# Patient Record
Sex: Female | Born: 1940 | Race: White | Hispanic: No | Smoking: Former smoker
Health system: Southern US, Community
[De-identification: ages and names within clinical notes are randomized; demographics above are authoritative.]

## PROBLEM LIST (undated history)

## (undated) DIAGNOSIS — M1990 Unspecified osteoarthritis, unspecified site: Secondary | ICD-10-CM

## (undated) DIAGNOSIS — C801 Malignant (primary) neoplasm, unspecified: Secondary | ICD-10-CM

## (undated) DIAGNOSIS — I351 Nonrheumatic aortic (valve) insufficiency: Secondary | ICD-10-CM

## (undated) DIAGNOSIS — K219 Gastro-esophageal reflux disease without esophagitis: Secondary | ICD-10-CM

## (undated) DIAGNOSIS — F419 Anxiety disorder, unspecified: Secondary | ICD-10-CM

## (undated) DIAGNOSIS — Z860101 Personal history of adenomatous and serrated colon polyps: Secondary | ICD-10-CM

## (undated) DIAGNOSIS — Z8489 Family history of other specified conditions: Secondary | ICD-10-CM

## (undated) DIAGNOSIS — J302 Other seasonal allergic rhinitis: Secondary | ICD-10-CM

## (undated) DIAGNOSIS — E079 Disorder of thyroid, unspecified: Secondary | ICD-10-CM

## (undated) DIAGNOSIS — F32A Depression, unspecified: Secondary | ICD-10-CM

## (undated) DIAGNOSIS — Z9289 Personal history of other medical treatment: Secondary | ICD-10-CM

## (undated) DIAGNOSIS — E785 Hyperlipidemia, unspecified: Secondary | ICD-10-CM

## (undated) DIAGNOSIS — Z780 Asymptomatic menopausal state: Secondary | ICD-10-CM

## (undated) DIAGNOSIS — I10 Essential (primary) hypertension: Secondary | ICD-10-CM

## (undated) DIAGNOSIS — J189 Pneumonia, unspecified organism: Secondary | ICD-10-CM

## (undated) DIAGNOSIS — E039 Hypothyroidism, unspecified: Secondary | ICD-10-CM

## (undated) DIAGNOSIS — K5790 Diverticulosis of intestine, part unspecified, without perforation or abscess without bleeding: Secondary | ICD-10-CM

## (undated) DIAGNOSIS — S52502A Unspecified fracture of the lower end of left radius, initial encounter for closed fracture: Secondary | ICD-10-CM

## (undated) DIAGNOSIS — M8580 Other specified disorders of bone density and structure, unspecified site: Secondary | ICD-10-CM

## (undated) DIAGNOSIS — F329 Major depressive disorder, single episode, unspecified: Secondary | ICD-10-CM

## (undated) DIAGNOSIS — K5792 Diverticulitis of intestine, part unspecified, without perforation or abscess without bleeding: Secondary | ICD-10-CM

## (undated) DIAGNOSIS — L900 Lichen sclerosus et atrophicus: Secondary | ICD-10-CM

## (undated) DIAGNOSIS — Z86010 Personal history of colonic polyps: Secondary | ICD-10-CM

## (undated) HISTORY — DX: Other seasonal allergic rhinitis: J30.2

## (undated) HISTORY — DX: Diverticulitis of intestine, part unspecified, without perforation or abscess without bleeding: K57.92

## (undated) HISTORY — PX: CARDIAC CATHETERIZATION: SHX172

## (undated) HISTORY — DX: Asymptomatic menopausal state: Z78.0

## (undated) HISTORY — PX: HYSTEROSCOPY: SHX211

## (undated) HISTORY — DX: Disorder of thyroid, unspecified: E07.9

## (undated) HISTORY — DX: Gastro-esophageal reflux disease without esophagitis: K21.9

## (undated) HISTORY — DX: Nonrheumatic aortic (valve) insufficiency: I35.1

## (undated) HISTORY — PX: BACK SURGERY: SHX140

## (undated) HISTORY — PX: CATARACT EXTRACTION W/ INTRAOCULAR LENS  IMPLANT, BILATERAL: SHX1307

## (undated) HISTORY — PX: DILATION AND CURETTAGE OF UTERUS: SHX78

## (undated) HISTORY — DX: Diverticulosis of intestine, part unspecified, without perforation or abscess without bleeding: K57.90

## (undated) HISTORY — DX: Other specified disorders of bone density and structure, unspecified site: M85.80

## (undated) HISTORY — DX: Hyperlipidemia, unspecified: E78.5

## (undated) HISTORY — DX: Personal history of adenomatous and serrated colon polyps: Z86.0101

## (undated) HISTORY — PX: JOINT REPLACEMENT: SHX530

## (undated) HISTORY — PX: TONSILLECTOMY: SUR1361

## (undated) HISTORY — DX: Lichen sclerosus et atrophicus: L90.0

## (undated) HISTORY — PX: BLEPHAROPLASTY: SUR158

## (undated) HISTORY — PX: CARPAL TUNNEL RELEASE: SHX101

## (undated) HISTORY — DX: Personal history of colonic polyps: Z86.010

---

## 1975-08-30 DIAGNOSIS — Z9289 Personal history of other medical treatment: Secondary | ICD-10-CM

## 1975-08-30 HISTORY — DX: Personal history of other medical treatment: Z92.89

## 1975-08-30 HISTORY — PX: APPENDECTOMY: SHX54

## 1976-08-29 HISTORY — PX: RIGHT OOPHORECTOMY: SHX2359

## 1992-08-29 HISTORY — PX: PATELLA RECONSTRUCTION: SHX736

## 1997-08-29 HISTORY — PX: CERVICAL FUSION: SHX112

## 1998-02-27 ENCOUNTER — Ambulatory Visit (HOSPITAL_COMMUNITY): Admission: RE | Admit: 1998-02-27 | Discharge: 1998-02-27 | Payer: Self-pay | Admitting: Surgery

## 1998-07-30 ENCOUNTER — Other Ambulatory Visit: Admission: RE | Admit: 1998-07-30 | Discharge: 1998-07-30 | Payer: Self-pay | Admitting: Obstetrics and Gynecology

## 1999-07-30 ENCOUNTER — Other Ambulatory Visit: Admission: RE | Admit: 1999-07-30 | Discharge: 1999-07-30 | Payer: Self-pay | Admitting: Obstetrics and Gynecology

## 1999-08-25 ENCOUNTER — Encounter: Admission: RE | Admit: 1999-08-25 | Discharge: 1999-08-25 | Payer: Self-pay | Admitting: Obstetrics and Gynecology

## 1999-08-25 ENCOUNTER — Encounter: Payer: Self-pay | Admitting: Obstetrics and Gynecology

## 2000-08-14 ENCOUNTER — Other Ambulatory Visit: Admission: RE | Admit: 2000-08-14 | Discharge: 2000-08-14 | Payer: Self-pay | Admitting: Obstetrics and Gynecology

## 2000-12-25 ENCOUNTER — Other Ambulatory Visit: Admission: RE | Admit: 2000-12-25 | Discharge: 2000-12-25 | Payer: Self-pay | Admitting: Obstetrics and Gynecology

## 2000-12-25 ENCOUNTER — Encounter (INDEPENDENT_AMBULATORY_CARE_PROVIDER_SITE_OTHER): Payer: Self-pay | Admitting: Specialist

## 2001-03-20 ENCOUNTER — Encounter: Payer: Self-pay | Admitting: Internal Medicine

## 2001-03-20 ENCOUNTER — Encounter (INDEPENDENT_AMBULATORY_CARE_PROVIDER_SITE_OTHER): Payer: Self-pay | Admitting: *Deleted

## 2001-03-20 ENCOUNTER — Ambulatory Visit (HOSPITAL_COMMUNITY): Admission: RE | Admit: 2001-03-20 | Discharge: 2001-03-20 | Payer: Self-pay | Admitting: Gastroenterology

## 2001-11-14 ENCOUNTER — Encounter: Payer: Self-pay | Admitting: Obstetrics and Gynecology

## 2001-11-14 ENCOUNTER — Encounter: Admission: RE | Admit: 2001-11-14 | Discharge: 2001-11-14 | Payer: Self-pay | Admitting: Obstetrics and Gynecology

## 2002-01-11 ENCOUNTER — Other Ambulatory Visit: Admission: RE | Admit: 2002-01-11 | Discharge: 2002-01-11 | Payer: Self-pay | Admitting: Obstetrics and Gynecology

## 2002-03-08 ENCOUNTER — Encounter: Payer: Self-pay | Admitting: Gastroenterology

## 2002-03-08 ENCOUNTER — Ambulatory Visit (HOSPITAL_COMMUNITY): Admission: RE | Admit: 2002-03-08 | Discharge: 2002-03-08 | Payer: Self-pay | Admitting: Gastroenterology

## 2002-07-08 ENCOUNTER — Encounter: Payer: Self-pay | Admitting: Internal Medicine

## 2002-08-14 ENCOUNTER — Ambulatory Visit (HOSPITAL_COMMUNITY): Admission: RE | Admit: 2002-08-14 | Discharge: 2002-08-14 | Payer: Self-pay | Admitting: *Deleted

## 2002-08-14 ENCOUNTER — Encounter (INDEPENDENT_AMBULATORY_CARE_PROVIDER_SITE_OTHER): Payer: Self-pay | Admitting: *Deleted

## 2002-08-29 HISTORY — PX: LUMBAR DISC SURGERY: SHX700

## 2002-11-06 ENCOUNTER — Encounter: Admission: RE | Admit: 2002-11-06 | Discharge: 2002-11-06 | Payer: Self-pay | Admitting: Family Medicine

## 2002-11-06 ENCOUNTER — Encounter: Payer: Self-pay | Admitting: Family Medicine

## 2002-11-12 ENCOUNTER — Ambulatory Visit (HOSPITAL_COMMUNITY): Admission: RE | Admit: 2002-11-12 | Discharge: 2002-11-13 | Payer: Self-pay | Admitting: Neurosurgery

## 2002-11-12 ENCOUNTER — Encounter: Payer: Self-pay | Admitting: Neurosurgery

## 2003-04-23 ENCOUNTER — Other Ambulatory Visit: Admission: RE | Admit: 2003-04-23 | Discharge: 2003-04-23 | Payer: Self-pay | Admitting: Obstetrics and Gynecology

## 2004-07-01 ENCOUNTER — Other Ambulatory Visit: Admission: RE | Admit: 2004-07-01 | Discharge: 2004-07-01 | Payer: Self-pay | Admitting: Obstetrics and Gynecology

## 2004-12-31 ENCOUNTER — Encounter: Admission: RE | Admit: 2004-12-31 | Discharge: 2004-12-31 | Payer: Self-pay | Admitting: Emergency Medicine

## 2005-05-11 ENCOUNTER — Ambulatory Visit: Payer: Self-pay | Admitting: Cardiology

## 2005-05-27 ENCOUNTER — Ambulatory Visit: Payer: Self-pay | Admitting: Cardiology

## 2005-07-06 ENCOUNTER — Other Ambulatory Visit: Admission: RE | Admit: 2005-07-06 | Discharge: 2005-07-06 | Payer: Self-pay | Admitting: Obstetrics and Gynecology

## 2005-08-11 ENCOUNTER — Encounter: Payer: Self-pay | Admitting: Internal Medicine

## 2005-09-26 ENCOUNTER — Encounter: Payer: Self-pay | Admitting: Internal Medicine

## 2005-10-13 ENCOUNTER — Encounter: Payer: Self-pay | Admitting: Internal Medicine

## 2005-10-17 ENCOUNTER — Encounter: Payer: Self-pay | Admitting: Internal Medicine

## 2006-05-26 ENCOUNTER — Encounter: Payer: Self-pay | Admitting: Internal Medicine

## 2006-08-04 ENCOUNTER — Other Ambulatory Visit: Admission: RE | Admit: 2006-08-04 | Discharge: 2006-08-04 | Payer: Self-pay | Admitting: Obstetrics and Gynecology

## 2006-09-11 ENCOUNTER — Encounter: Payer: Self-pay | Admitting: Internal Medicine

## 2006-10-31 ENCOUNTER — Encounter: Payer: Self-pay | Admitting: Internal Medicine

## 2006-12-15 ENCOUNTER — Ambulatory Visit: Payer: Self-pay | Admitting: Cardiology

## 2006-12-15 LAB — CONVERTED CEMR LAB
ALT: 65 units/L — ABNORMAL HIGH (ref 0–40)
AST: 50 units/L — ABNORMAL HIGH (ref 0–37)
BUN: 14 mg/dL (ref 6–23)
Bilirubin, Direct: 0.1 mg/dL (ref 0.0–0.3)
CO2: 29 meq/L (ref 19–32)
Cholesterol: 250 mg/dL (ref 0–200)
Direct LDL: 181.9 mg/dL
GFR calc non Af Amer: 106 mL/min
Sodium: 146 meq/L — ABNORMAL HIGH (ref 135–145)
Total Bilirubin: 0.9 mg/dL (ref 0.3–1.2)
Total Protein: 6.4 g/dL (ref 6.0–8.3)
Triglycerides: 177 mg/dL — ABNORMAL HIGH (ref 0–149)
VLDL: 35 mg/dL (ref 0–40)

## 2006-12-19 ENCOUNTER — Ambulatory Visit: Payer: Self-pay | Admitting: Cardiology

## 2007-01-29 ENCOUNTER — Ambulatory Visit: Payer: Self-pay | Admitting: Cardiology

## 2007-01-29 LAB — CONVERTED CEMR LAB
AST: 34 units/L (ref 0–37)
Alkaline Phosphatase: 59 units/L (ref 39–117)
LDL Cholesterol: 123 mg/dL — ABNORMAL HIGH (ref 0–99)
TSH: 2.91 microintl units/mL (ref 0.35–5.50)
Total CHOL/HDL Ratio: 5.1

## 2007-02-09 ENCOUNTER — Ambulatory Visit: Payer: Self-pay | Admitting: Cardiology

## 2007-04-19 ENCOUNTER — Ambulatory Visit: Payer: Self-pay | Admitting: Internal Medicine

## 2007-08-01 ENCOUNTER — Ambulatory Visit: Payer: Self-pay | Admitting: Cardiology

## 2007-08-01 LAB — CONVERTED CEMR LAB
ALT: 29 units/L (ref 0–35)
AST: 28 units/L (ref 0–37)
Albumin: 4 g/dL (ref 3.5–5.2)
Alkaline Phosphatase: 66 units/L (ref 39–117)
Bilirubin, Direct: 0.1 mg/dL (ref 0.0–0.3)
HDL: 36.9 mg/dL — ABNORMAL LOW (ref >39.0)
Total Bilirubin: 0.9 mg/dL (ref 0.3–1.2)
Total CHOL/HDL Ratio: 7.5
Total Protein: 7 g/dL (ref 6.0–8.3)
VLDL: 42 mg/dL — ABNORMAL HIGH (ref 0–40)

## 2007-08-13 ENCOUNTER — Other Ambulatory Visit: Admission: RE | Admit: 2007-08-13 | Discharge: 2007-08-13 | Payer: Self-pay | Admitting: Obstetrics and Gynecology

## 2008-02-18 ENCOUNTER — Telehealth: Payer: Self-pay | Admitting: Internal Medicine

## 2008-02-20 ENCOUNTER — Telehealth (INDEPENDENT_AMBULATORY_CARE_PROVIDER_SITE_OTHER): Payer: Self-pay | Admitting: *Deleted

## 2008-02-28 ENCOUNTER — Telehealth (INDEPENDENT_AMBULATORY_CARE_PROVIDER_SITE_OTHER): Payer: Self-pay | Admitting: *Deleted

## 2008-08-13 ENCOUNTER — Encounter: Payer: Self-pay | Admitting: Obstetrics and Gynecology

## 2008-08-13 ENCOUNTER — Ambulatory Visit: Payer: Self-pay | Admitting: Obstetrics and Gynecology

## 2008-08-13 ENCOUNTER — Other Ambulatory Visit: Admission: RE | Admit: 2008-08-13 | Discharge: 2008-08-13 | Payer: Self-pay | Admitting: Obstetrics and Gynecology

## 2008-11-17 ENCOUNTER — Ambulatory Visit: Payer: Self-pay | Admitting: Obstetrics and Gynecology

## 2009-01-15 ENCOUNTER — Encounter: Admission: RE | Admit: 2009-01-15 | Discharge: 2009-01-15 | Payer: Self-pay | Admitting: Family Medicine

## 2009-04-10 DIAGNOSIS — E782 Mixed hyperlipidemia: Secondary | ICD-10-CM | POA: Insufficient documentation

## 2009-04-15 ENCOUNTER — Ambulatory Visit: Payer: Self-pay | Admitting: Cardiology

## 2009-08-17 ENCOUNTER — Other Ambulatory Visit: Admission: RE | Admit: 2009-08-17 | Discharge: 2009-08-17 | Payer: Self-pay | Admitting: Obstetrics and Gynecology

## 2009-08-17 ENCOUNTER — Ambulatory Visit: Payer: Self-pay | Admitting: Obstetrics and Gynecology

## 2009-09-07 ENCOUNTER — Telehealth: Payer: Self-pay | Admitting: Internal Medicine

## 2009-10-09 ENCOUNTER — Ambulatory Visit: Payer: Self-pay | Admitting: Internal Medicine

## 2009-10-09 DIAGNOSIS — K5900 Constipation, unspecified: Secondary | ICD-10-CM | POA: Insufficient documentation

## 2009-10-09 DIAGNOSIS — Z860100 Personal history of colon polyps, unspecified: Secondary | ICD-10-CM | POA: Insufficient documentation

## 2009-10-27 ENCOUNTER — Ambulatory Visit: Payer: Self-pay | Admitting: Obstetrics and Gynecology

## 2009-12-07 ENCOUNTER — Ambulatory Visit: Payer: Self-pay | Admitting: Obstetrics and Gynecology

## 2010-06-27 ENCOUNTER — Encounter: Admission: RE | Admit: 2010-06-27 | Discharge: 2010-06-27 | Payer: Self-pay | Admitting: Orthopedic Surgery

## 2010-07-08 ENCOUNTER — Encounter: Payer: Self-pay | Admitting: Internal Medicine

## 2010-07-13 ENCOUNTER — Encounter (INDEPENDENT_AMBULATORY_CARE_PROVIDER_SITE_OTHER): Payer: Self-pay | Admitting: *Deleted

## 2010-08-12 ENCOUNTER — Encounter (INDEPENDENT_AMBULATORY_CARE_PROVIDER_SITE_OTHER): Payer: Self-pay | Admitting: *Deleted

## 2010-08-12 ENCOUNTER — Ambulatory Visit: Payer: Self-pay | Admitting: Internal Medicine

## 2010-08-30 ENCOUNTER — Ambulatory Visit: Payer: Self-pay | Admitting: Obstetrics and Gynecology

## 2010-08-31 ENCOUNTER — Telehealth (INDEPENDENT_AMBULATORY_CARE_PROVIDER_SITE_OTHER): Payer: Self-pay | Admitting: *Deleted

## 2010-09-01 ENCOUNTER — Telehealth: Payer: Self-pay | Admitting: Internal Medicine

## 2010-09-02 ENCOUNTER — Ambulatory Visit: Admit: 2010-09-02 | Payer: Self-pay | Admitting: Internal Medicine

## 2010-09-08 ENCOUNTER — Encounter: Payer: Self-pay | Admitting: Internal Medicine

## 2010-09-08 ENCOUNTER — Ambulatory Visit
Admission: RE | Admit: 2010-09-08 | Discharge: 2010-09-08 | Payer: Self-pay | Source: Home / Self Care | Attending: Internal Medicine | Admitting: Internal Medicine

## 2010-09-08 DIAGNOSIS — K219 Gastro-esophageal reflux disease without esophagitis: Secondary | ICD-10-CM | POA: Insufficient documentation

## 2010-09-08 DIAGNOSIS — K5732 Diverticulitis of large intestine without perforation or abscess without bleeding: Secondary | ICD-10-CM | POA: Insufficient documentation

## 2010-09-18 ENCOUNTER — Encounter: Payer: Self-pay | Admitting: Family Medicine

## 2010-09-30 NOTE — Progress Notes (Signed)
Summary: Diverticulitis flare - follow up   Phone Note Outgoing Call   Summary of Call: Pt. contacted about tx. for diverticulitis on Jan.2./12 by Dr.Brodie via phone.Dr.Brodie says she treated pt. with 10 days of Cipro and Flagyl and gave her Vicodin for pain.Pt. had left message on paging co. machine to cx. the procedure for Thursday but procedure not cx. Pt. told I will cx. procedure for Thursday and she needs to schedule f/u appt. with Dr.Perry and she will do so. Initial call taken by: Teryl Lucy RN,  August 31, 2010 2:56 PM  Follow-up for Phone Call        agree Follow-up by: Hilarie Fredrickson MD,  August 31, 2010 3:12 PM

## 2010-09-30 NOTE — Progress Notes (Signed)
Summary: Appt sooner than next avail   Phone Note Call from Patient Call back at cell [phone]   Call For: Dr Marina Goodell Summary of Call: Was told to schedule a follow up appointment but refused next available on 09-27-2010. wans to be seen sooner for diverticulitis flare up. Initial call taken by: Leanor Kail [facility],  September 01, 2010 1:32 PM  Follow-up for Phone Call        Given r.o.v. appt. for 09/08/10. Follow-up by: Teryl Lucy RN,  September 01, 2010 1:52 PM

## 2010-09-30 NOTE — Letter (Signed)
Summary: [facility]   Imported By: Sherian Rein 10/12/2009 12:02:51  _____________________________________________________________________  External Attachment:    Type:   Image     Comment:   External Document

## 2010-09-30 NOTE — Miscellaneous (Signed)
Summary: [facility]  Clinical Lists Changes  Medications: Added new medication of MOVIPREP 100 GM  SOLR (PEG-KCL-NACL-NASULF-NA ASC-C) As per prep instructions. - Signed Rx of MOVIPREP 100 GM  SOLR (PEG-KCL-NACL-NASULF-NA ASC-C) As per prep instructions.;  #1 x 0;  Signed;  Entered by: Ezra Sites RN;  Authorized by: Hilarie Fredrickson MD;  Method used: Electronically to [facility] # [phone]*, [address], [place], [place]  [address], Ph: [phone], Fax: [phone] Observations: Added new observation of ALLERGY REV: Done (08/13/2010 15:24)    Prescriptions: MOVIPREP 100 GM  SOLR (PEG-KCL-NACL-NASULF-NA ASC-C) As per prep instructions.  #1 x 0   Entered by:   Ezra Sites RN   Authorized by:   Hilarie Fredrickson MD   Signed by:   Ezra Sites RN on 08/13/2010   Method used:   Electronically to        [facility] # [phone]* (retail)       [address]       [place], [place]  [address]       Ph: [phone]       Fax: [phone]   RxID:   [id number]

## 2010-09-30 NOTE — Letter (Signed)
Summary: Colonoscopy Letter  [facility]  [address] [place], [place] [address]   Phone: [phone]  Fax: [phone]      July 08, 2010 MRN: [id number]   [facility] [address] [place], [place]  [address]   Dear Ms. Rocco,   According to your medical record, it is time for you to schedule a Colonoscopy. The American Cancer Society recommends this procedure as a method to detect early colon cancer. Patients with a family history of colon cancer, or a personal history of colon polyps or inflammatory bowel disease are at increased risk.  This letter has been generated based on the recommendations made at the time of your procedure. If you feel that in your particular situation this may no longer apply, please contact our office.  Please call our office at [phone] to schedule this appointment or to update your records at your earliest convenience.  Thank you for cooperating with [place] to provide you with the very best care possible.   Sincerely,  Wilhemina Bonito. Marina Goodell, M.D.  [facility] [phone]

## 2010-09-30 NOTE — Procedures (Signed)
[facility]   Imported By: Sherian Rein 10/12/2009 12:07:39  _____________________________________________________________________  External Attachment:    Type:   Image     Comment:   External Document

## 2010-09-30 NOTE — Letter (Signed)
Summary: [facility]   Imported By: Sherian Rein 10/12/2009 12:00:22  _____________________________________________________________________  External Attachment:    Type:   Image     Comment:   External Document

## 2010-09-30 NOTE — Letter (Signed)
Summary: [facility]   Imported By: Sherian Rein 10/12/2009 12:04:36  _____________________________________________________________________  External Attachment:    Type:   Image     Comment:   External Document

## 2010-09-30 NOTE — Letter (Signed)
Summary: [facility]   Imported By: Sherian Rein 10/12/2009 12:01:28  _____________________________________________________________________  External Attachment:    Type:   Image     Comment:   External Document

## 2010-09-30 NOTE — Letter (Signed)
Summary: Moviprep Instructions  [facility]  [address]   [place], [place] [address]   Phone: [phone]  Fax: [phone]       KHALIA GONG    February 15, 1941    MRN: [id number]        Procedure Day Dorna Bloom: Thursday, 09-02-10     Arrival Time: 7:30 a.m.     Procedure Time: 8:00 a.m.     Location of Procedure:                     x  [facility] (4th Floor)   PREPARATION FOR COLONOSCOPY WITH MOVIPREP   Starting 5 days prior to your procedure 08-28-10 do not eat nuts, seeds, popcorn, corn, beans, peas,  salads, or any raw vegetables.  Do not take any fiber supplements (e.g. Metamucil, Citrucel, and Benefiber).  THE DAY BEFORE YOUR PROCEDURE         DATE: 09-01-10   DAY: Wednesday  1.  Drink clear liquids the entire day-NO SOLID FOOD  2.  Do not drink anything colored red or purple.  Avoid juices with pulp.  No orange juice.  3.  Drink at least 64 oz. (8 glasses) of fluid/clear liquids during the day to prevent dehydration and help the prep work efficiently.  CLEAR LIQUIDS INCLUDE: Water Jello Ice Popsicles Tea (sugar ok, no milk/cream) Powdered fruit flavored drinks Coffee (sugar ok, no milk/cream) Gatorade Juice: apple, white grape, white cranberry  Lemonade Clear bullion, consomm, broth Carbonated beverages (any kind) Strained chicken noodle soup Hard Candy                             4.  In the morning, mix first dose of MoviPrep solution:    Empty 1 Pouch A and 1 Pouch B into the disposable container    Add lukewarm drinking water to the top line of the container. Mix to dissolve    Refrigerate (mixed solution should be used within 24 hrs)  5.  Begin drinking the prep at 5:00 p.m. The MoviPrep container is divided by 4 marks.   Every 15 minutes drink the solution down to the next mark (approximately 8 oz) until the full liter is complete.   6.  Follow completed prep with 16 oz of clear liquid of your choice (Nothing red or  purple).  Continue to drink clear liquids until bedtime.  7.  Before going to bed, mix second dose of MoviPrep solution:    Empty 1 Pouch A and 1 Pouch B into the disposable container    Add lukewarm drinking water to the top line of the container. Mix to dissolve    Refrigerate  THE DAY OF YOUR PROCEDURE      DATE: 09-02-10  DAY: Thursday  Beginning at 3:00 a.m. (5 hours before procedure):         1. Every 15 minutes, drink the solution down to the next mark (approx 8 oz) until the full liter is complete.  2. Follow completed prep with 16 oz. of clear liquid of your choice.    3. You may drink clear liquids until 6:00 a.m.  (2 HOURS BEFORE PROCEDURE).   MEDICATION INSTRUCTIONS  Unless otherwise instructed, you should take regular prescription medications with a small sip of water   as early as possible the morning of your procedure.         OTHER INSTRUCTIONS  You will need a responsible  adult at least 71 years of age to accompany you and drive you home.   This person must remain in the waiting room during your procedure.  Wear loose fitting clothing that is easily removed.  Leave jewelry and other valuables at home.  However, you may wish to bring a book to read or  an iPod/MP3 player to listen to music as you wait for your procedure to start.  Remove all body piercing jewelry and leave at home.  Total time from sign-in until discharge is approximately 2-3 hours.  You should go home directly after your procedure and rest.  You can resume normal activities the  day after your procedure.  The day of your procedure you should not:   Drive   Make legal decisions   Operate machinery   Drink alcohol   Return to work  You will receive specific instructions about eating, activities and medications before you leave.    The above instructions have been reviewed and explained to me by  Ezra Sites RN  August 13, 2010 4:02 PM   I fully understand and can  verbalize these instructions _____________________________ Date _________

## 2010-09-30 NOTE — Assessment & Plan Note (Signed)
Summary: TO DISCUSS COLONOSCOPY   / constipation    History of Present Illness Visit Type: Follow-up Visit Primary GI MD: Yancey Flemings MD Primary Provider: Lajean Manes Chief Complaint: Discuss Colonoscopy/ Dr. Virginia Rochester patient History of Present Illness:   70 year old white female with a history of hypothyroidism, and dyslipidemia, chronic GERD, recurrent diverticulitis, and adenomatous colon polyps. She presents today regarding a personal history of colon polyps in the need for surveillance colonoscopy. Also, questions regarding constipation. Patient was last evaluated in the office as a new patient in August of 2008. Today dictation. She has seen multiple prior gastroenterologists. She underwent colonoscopy in 2002 and was found to have a diminutive adenoma. Repeat colonoscopy in December 2006 with a small polyp found in the an adenoma. At the time of her last visit we recommended surveillance colonoscopy in 5 years (or December 2011). This was based on current guidelines. She contacted the office recently questioning the followup interval. She was under the impression that most offices recommend colonoscopy every 2 years. She also tells me that she has been affected by a friend who was recently diagnosed with colon cancer. The patient denies any relevant GI symptoms. She denies abdominal pain, change in bowel habits, rectal bleeding, weight loss, or anemia. She does have chronic intermittent constipation which is unchanged for many years. She requests a recommendation for treatment.   GI Review of Systems      Denies abdominal pain, acid reflux, belching, bloating, chest pain, dysphagia with liquids, dysphagia with solids, heartburn, loss of appetite, nausea, vomiting, vomiting blood, weight loss, and  weight gain.      Reports constipation and  diverticulosis.     Denies anal fissure, black tarry stools, change in bowel habit, diarrhea, fecal incontinence, heme positive stool, hemorrhoids, irritable  bowel syndrome, jaundice, light color stool, liver problems, rectal bleeding, and  rectal pain.    Current Medications (verified): 1)  Synthroid 100 Mcg Tabs (Levothyroxine Sodium) .Marland Kitchen.. 1 Tab Once Daily 2)  Allegra 180 Mg Tabs (Fexofenadine Hcl) .... As Needed 3)  Calcium 600 600 Mg Tabs (Calcium Carbonate) .... 3  Tab Two Times A Day 4)  Vitamin D 5000 Iu .Marland Kitchen.. 1 Cap By Mouth Two Times A Day 5)  Niaspan 500 Mg Cr-Tabs (Niacin (Antihyperlipidemic)) .... Take 2 Tablets By Mouth Once Daily 6)  Levosa .... Take 4 Capsules Daily  Allergies: 1)  ! Pcn 2)  ! * Mycins 3)  ! Asa 4)  ! Lipitor (Atorvastatin)  Past History:  Past Medical History: Reviewed history from 04/10/2009 and no changes required. HYPERLIPIDEMIA-MIXED (ICD-272.4) GERD Diverticulitis Hypothyroidism Hemorrhoids Adenomatous Colon Polyps  Past Surgical History: Appendectomy back surgery/cervical disc fusion Knee Arthroscopy  Family History: Reviewed history from 04/10/2009 and no changes required. no family history of gastrointestinal malignancy.  Social History: Divorced  Alcohol Use - yes occasional Tobacco Use - No.  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  Vital Signs:  Patient profile:   70 year old female Height:      61.5 inches Weight:      167.38 pounds BMI:     31.23 Pulse rate:   76 / minute Pulse rhythm:   regular BP sitting:   120 / 76  (left arm) Cuff size:   regular  Vitals Entered By: June McMurray CMA Duncan Dull) (October 09, 2009 1:07 PM)  Physical Exam  General:  Well  developed, well nourished, no acute distress. Head:  Normocephalic and atraumatic. Eyes:  PERRLA, no icterus. Nose:  No deformity, discharge,  or lesions. Mouth:  No deformity or lesions, Neck:  Supple; no masses or thyromegaly. Lungs:  Clear throughout to auscultation. Heart:  Regular rate and rhythm; no murmurs, rubs,  or  bruits. Abdomen:  Soft, nontender and nondistended. No masses, hepatosplenomegaly or hernias noted. Normal bowel sounds. Msk:  Symmetrical with no gross deformities. Normal posture. Pulses:  Normal pulses noted. Extremities:  No clubbing, cyanosis, edema or deformities noted. Neurologic:  Alert and  oriented x4;  Skin:  Intact without significant lesions or rashes. Psych:  Alert and cooperative. Normal mood and affect.   Impression & Recommendations:  Problem # 1:  PERSONAL HX COLONIC POLYPS (ICD-V12.72) personal history of adenomatous colon polyps. Due for surveillance December 2011. Currently asymptomatic. I reviewed with her in detail the current guidelines for polyp surveillance. She is asymptomatic without any clinical indication to change the surveillance interval. Over 50% of today's encounter was spent counseling the patient. Recall as been confirmed for December 2011.  Problem # 2:  CONSTIPATION (ICD-564.00) chronic stable intermittent constipation. We have recommended GlycoLax p.r.n. We discussed how to titrate this to her needs.  Patient Instructions: 1)  The medication list was reviewed and reconciled.  All changed / newly prescribed medications were explained.  A complete medication list was provided to the patient / caregiver. 2)  Copy: Dr. Lajean Manes

## 2010-09-30 NOTE — Letter (Signed)
Summary: [facility]   Imported By: Sherian Rein 10/12/2009 11:53:41  _____________________________________________________________________  External Attachment:    Type:   Image     Comment:   External Document

## 2010-09-30 NOTE — Letter (Signed)
Summary: Pre Visit Letter Revised  [facility]  [address] [place], [place] [address]   Phone: [phone]  Fax: [phone]        07/13/2010 MRN: [id number]   [facility] [address] [place], [place]  [address]             Procedure Date:  September 02, 2010  Welcome to the Gastroenterology Division at [facility].    You are scheduled to see a nurse for your pre-procedure visit on 08/12/10 at 4:00 P.M.  on the 3rd floor at [facility], [address]  We ask that you try to arrive at our office 15 minutes prior to your appointment time to allow for check-in.  Please take a minute to review the attached form.  If you answer "Yes" to one or more of the questions on the first page, we ask that you call the person listed at your earliest opportunity.  If you answer "No" to all of the questions, please complete the rest of the form and bring it to your appointment.    Your nurse visit will consist of discussing your medical and surgical history, your immediate family medical history, and your medications.   If you are unable to list all of your medications on the form, please bring the medication bottles to your appointment and we will list them.  We will need to be aware of both prescribed and over the counter drugs.  We will need to know exact dosage information as well.    Please be prepared to read and sign documents such as consent forms, a financial agreement, and acknowledgement forms.  If necessary, and with your consent, a friend or relative is welcome to sit-in on the nurse visit with you.  Please bring your insurance card so that we may make a copy of it.  If your insurance requires a referral to see a specialist, please bring your referral form from your primary care physician.  No co-pay is required for this nurse visit.     If you cannot keep your appointment, please call [phone] to cancel or reschedule prior to your  appointment date.  This allows [place] the opportunity to schedule an appointment for another patient in need of care.    Thank you for choosing [facility] for your medical needs.  We appreciate the opportunity to care for you.  Please visit [place] at our website  to learn more about our practice.  Sincerely, The Gastroenterology Division

## 2010-09-30 NOTE — Letter (Signed)
Summary: [facility] Instructions  [facility]  [address] [place], [place] [address]   Phone: [phone]  Fax: [phone]       HOWARD PATTON    [birth year]    MRN: [id number]        Procedure Day /Date:TUESDAY, 10/26/10     Arrival Time:8:00 AM     Procedure Time:9:00 AM     Location of Procedure:                    X  [facility] (4th Floor)   PREPARATION FOR COLONOSCOPY WITH MOVIPREP   Starting 5 days prior to your procedure2/23/12 do not eat nuts, seeds, popcorn, corn, beans, peas,  salads, or any raw vegetables.  Do not take any fiber supplements (e.g. Metamucil, Citrucel, and Benefiber).  THE DAY BEFORE YOUR PROCEDURE         DATE: 10/25/10  DAY: MONDAY  1.  Drink clear liquids the entire day-NO SOLID FOOD  2.  Do not drink anything colored red or purple.  Avoid juices with pulp.  No orange juice.  3.  Drink at least 64 oz. (8 glasses) of fluid/clear liquids during the day to prevent dehydration and help the prep work efficiently.  CLEAR LIQUIDS INCLUDE: Water Jello Ice Popsicles Tea (sugar ok, no milk/cream) Powdered fruit flavored drinks Coffee (sugar ok, no milk/cream) Gatorade Juice: apple, white grape, white cranberry  Lemonade Clear bullion, consomm, broth Carbonated beverages (any kind) Strained chicken noodle soup Hard Candy                             4.  In the morning, mix first dose of MoviPrep solution:    Empty 1 Pouch A and 1 Pouch B into the disposable container    Add lukewarm drinking water to the top line of the container. Mix to dissolve    Refrigerate (mixed solution should be used within 24 hrs)  5.  Begin drinking the prep at 5:00 p.m. The MoviPrep container is divided by 4 marks.   Every 15 minutes drink the solution down to the next mark (approximately 8 oz) until the full liter is complete.   6.  Follow completed prep with 16 oz of clear liquid of your choice (Nothing red or purple).   Continue to drink clear liquids until bedtime.  7.  Before going to bed, mix second dose of MoviPrep solution:    Empty 1 Pouch A and 1 Pouch B into the disposable container    Add lukewarm drinking water to the top line of the container. Mix to dissolve    Refrigerate  THE DAY OF YOUR PROCEDURE      DATE: 10/26/10 DAY: TUESDAY  Beginning at 4:00 a.m. (5 hours before procedure):         1. Every 15 minutes, drink the solution down to the next mark (approx 8 oz) until the full liter is complete.  2. Follow completed prep with 16 oz. of clear liquid of your choice.    3. You may drink clear liquids until 7:00 AM (2 HOURS BEFORE PROCEDURE).   MEDICATION INSTRUCTIONS  Unless otherwise instructed, you should take regular prescription medications with a small sip of water   as early as possible the morning of your procedure.         OTHER INSTRUCTIONS  You will need a responsible adult at least 71 years of age to  accompany you and drive you home.   This person must remain in the waiting room during your procedure.  Wear loose fitting clothing that is easily removed.  Leave jewelry and other valuables at home.  However, you may wish to bring a book to read or  an iPod/MP3 player to listen to music as you wait for your procedure to start.  Remove all body piercing jewelry and leave at home.  Total time from sign-in until discharge is approximately 2-3 hours.  You should go home directly after your procedure and rest.  You can resume normal activities the  day after your procedure.  The day of your procedure you should not:   Drive   Make legal decisions   Operate machinery   Drink alcohol   Return to work  You will receive specific instructions about eating, activities and medications before you leave.    The above instructions have been reviewed and explained to me by   _______________________    I fully understand and can verbalize these instructions  _____________________________ Date _________

## 2010-09-30 NOTE — Assessment & Plan Note (Signed)
Summary: Followup diverticular flare    History of Present Illness Visit Type: Follow-up Visit Primary GI MD: Yancey Flemings MD Primary Provider: Dr Guerry Bruin Requesting Provider: N/A Chief Complaint: Patient here for f/u after diverticulitis flare. She states that her LLQ abdominal pain and diarrhea have gotten better. However, she is now experiencing food getting stuck lower in esophagus as well as GERD symptoms. History of Present Illness:   70 year old female with hypothyroidism, GERD, hyperlipidemia, adenomatous colon polyps, and recurrent diverticulitis. Last colonoscopy in December of 2006 with Dr. Sabino Gasser. Small adenoma removed. Pandiverticulosis noted. Sent 5 year recall letter in November. Has schedule followup colonoscopy but developed acute symptoms, or a few weeks ago, both left lower quadrant abdominal pain consistent with previous bouts of diverticulitis. Has been treated with ciprofloxacin and metronidazole with improvement. Other complaints include some problems with reflux and mild dysphagia, which is actually improved somewhat. Upper endoscopy in 2003 unremarkable. Other GI complaints include chronic constipation and bloating   GI Review of Systems    Reports bloating, loss of appetite, and  nausea.      Denies abdominal pain, acid reflux, belching, chest pain, dysphagia with liquids, dysphagia with solids, heartburn, vomiting, vomiting blood, weight loss, and  weight gain.      Reports diverticulosis.     Denies anal fissure, black tarry stools, change in bowel habit, diarrhea, fecal incontinence, heme positive stool, hemorrhoids, irritable bowel syndrome, jaundice, light color stool, liver problems, rectal bleeding, and  rectal pain.  Preventive Screening-Counseling & Management  Alcohol-Tobacco     Smoking Status: quit    Current Medications (verified): 1)  Synthroid 100 Mcg Tabs (Levothyroxine Sodium) .Marland Kitchen.. 1 Tab Once Daily 2)  Allegra 180 Mg Tabs  (Fexofenadine Hcl) .... Take 1 Tablet By Mouth Once A Day As Needed 3)  Calcium 600 600 Mg Tabs (Calcium Carbonate) .... 3  Tab Two Times A Day 4)  Vitamin D 5000 Iu .Marland Kitchen.. 1 Cap By Mouth Two Times A Day 5)  Nabumetone 750 Mg Tabs (Nabumetone) .... Take 1 Tablet By Mouth Two Times A Day 6)  Tricor 145 Mg Tabs (Fenofibrate) .... Take 1 Tablet By Mouth Once A Day  Allergies (verified): 1)  ! Pcn 2)  ! * Mycins 3)  ! Asa 4)  ! Lipitor (Atorvastatin)  Past History:  Past Medical History: Reviewed history from 04/10/2009 and no changes required. HYPERLIPIDEMIA-MIXED (ICD-272.4) GERD Diverticulitis Hypothyroidism Hemorrhoids Adenomatous Colon Polyps  Past Surgical History: Appendectomy with oophorectomy and "tube removal" back surgery L4 Discectomy cervical disc fusion Left Knee Reconstruction  Family History: no family history of gastrointestinal malignancy. 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 -stopped 25 years ago Smoking Status:  quit  Review of Systems       The patient complains of arthritis/joint pain, muscle pains/cramps, night sweats, and shortness of breath.  The patient denies allergy/sinus, anemia, anxiety-new, back pain, blood in urine, breast changes/lumps, change in vision, confusion, cough, coughing up blood, depression-new, fainting, fatigue, fever, headaches-new, hearing problems, heart murmur, heart rhythm changes, itching, menstrual pain, nosebleeds, pregnancy symptoms, skin rash, sleeping problems, sore throat, swelling of feet/legs, swollen lymph glands, thirst - excessive , urination - excessive , urination changes/pain, urine leakage, vision changes, and voice change.    Vital Signs:  Patient profile:   70 year old female Height:  61.5 inches Weight:      164.13 pounds BMI:      30.62 BSA:     1.75 Pulse rate:   72 / minute Pulse rhythm:   regular BP sitting:   130 / 80  (left arm)  Vitals Entered By: Lamona Curl CMA Duncan Dull) (September 08, 2010 2:41 PM)  Physical Exam  General:  Well developed, well nourished, no acute distress. Head:  Normocephalic and atraumatic. Eyes:  PERRLA, no icterus. Mouth:  No deformity or lesions. Neck:  Supple; no masses or thyromegaly. Lungs:  Clear throughout to auscultation. Heart:  Regular rate and rhythm; no murmurs, rubs,  or bruits. Abdomen:  Soft, nontender and nondistended. No masses, hepatosplenomegaly or hernias noted. Normal bowel sounds. Rectal:  deferred until colonoscopy Msk:  Symmetrical with no gross deformities. Normal posture. Pulses:  Normal pulses noted. Extremities:  No clubbing, cyanosis, edema or deformities noted. Neurologic:  Alert and  oriented x4;  grossly normal neurologically. Skin:  Intact without significant lesions or rashes. Psych:  Alert and cooperative. Normal mood and affect.   Impression & Recommendations:  Problem # 1:  DIVERTICULITIS, COLON (ICD-562.11) acute diverticulitis. Improved after antibiotic therapy  Plan: #1. Discussion on diverticular disease. #2. Literature on diverticular disease provided for her review #3. Advance diet as tolerated  Problem # 2:  PERSONAL HX COLONIC POLYPS (ICD-V12.72) history of adenomatous colon polyp December 2006. Due for followup.   Plan: #1. Schedule colonoscopy. The nature of the procedure as well as the risks, benefits, and alternatives have been reviewed. She understood and agreed to proceed. Would wait about 4-6 weeks to make sure she is removed from this bout of diverticulitis. Movi prep prescribed. The patient instructed on its use  Problem # 3:  GERD (ICD-530.81) recent GERD symptoms.  Plan: #1. Reflux precautions #2. On demand PPI #3. If dysphagia returns and persists, schedule upper endoscopy.  Problem # 4:  CONSTIPATION  (ICD-564.00) MiraLax p.r.n.  Other Orders: Colonoscopy (Colon)  Patient Instructions: 1)  COLON LEC 10/26/10 9:00 am arrive at 8:00 am 2)  Pt. already has Movi prep from previous pre-visit. 3)  Instructions given to patient with new date and time. 4)  Copy sent to : Dr Guerry Bruin 5)  The medication list was reviewed and reconciled.  All changed / newly prescribed medications were explained.  A complete medication list was provided to the patient / caregiver. Prescriptions: MOVIPREP 100 GM  SOLR (PEG-KCL-NACL-NASULF-NA ASC-C) As per prep instructions.  #1 x 0   Entered by:   Milford Cage NCMA   Authorized by:   Hilarie Fredrickson MD   Signed by:   Milford Cage NCMA on 09/08/2010   Method used:   Electronically to        [facility] # [phone]* (retail)       [address]       [place], [place]  [address]       Ph: [phone]       Fax: [phone]   RxID:   [id number]

## 2010-09-30 NOTE — Letter (Signed)
Summary: Marilyn Rivas   Imported By: Sherian Rein 10/12/2009 11:54:57  _____________________________________________________________________  External Attachment:    Type:   Image     Comment:   External Document

## 2010-09-30 NOTE — Progress Notes (Signed)
Summary: ? re recall col   Phone Note Call from Patient Call back at [phone]   Caller: [P Call For: PERRY Reason for Call: Talk to Nurse Summary of Call: Patient has questions regarding recall col.... Initial call taken by: Tawni Levy [facility],  September 07, 2009 3:32 PM  Follow-up for Phone Call        Explained per reviewof her information from Dr.Orr per Velta Addison in her chart  her next colon is due in 07/2011.Explained to her that Dr.Perry follows the Cancer  Society guidelines.She said I have a history of diverticulosis and was getting colonoscopies every 2 yaears and those guidelines are not acceptable to me.Wants colon done now. Follow-up by: Teryl Lucy RN,  September 07, 2009 4:04 PM  Additional Follow-up for Phone Call Additional follow up Details #1::        have her make an office appointment to see me if she wishes to discuss this with me . thanks Additional Follow-up by: Hilarie Fredrickson MD,  September 07, 2009 7:12 PM    Additional Follow-up for Phone Call Additional follow up Details #2::    Above MD orders reviewed with patient. She has scheduled an appt. for 10-15-09 at 8:30am. Pt. instructed to call back as needed.  Follow-up by: Laureen Ochs LPN,  September 08, 2009 10:25 AM

## 2010-09-30 NOTE — Procedures (Signed)
Summary: Colonoscopy/[facility]  Colonoscopy/[facility]   Imported By: Sherian Rein 10/12/2009 12:06:27  _____________________________________________________________________  External Attachment:    Type:   Image     Comment:   External Document

## 2010-10-26 ENCOUNTER — Other Ambulatory Visit (AMBULATORY_SURGERY_CENTER): Payer: Medicare Other | Admitting: Internal Medicine

## 2010-10-26 ENCOUNTER — Encounter: Payer: Self-pay | Admitting: Internal Medicine

## 2010-10-26 DIAGNOSIS — Z86010 Personal history of colonic polyps: Secondary | ICD-10-CM

## 2010-10-26 DIAGNOSIS — Z1211 Encounter for screening for malignant neoplasm of colon: Secondary | ICD-10-CM

## 2010-10-26 DIAGNOSIS — K5730 Diverticulosis of large intestine without perforation or abscess without bleeding: Secondary | ICD-10-CM

## 2010-11-04 NOTE — Procedures (Signed)
Summary: Colonoscopy  Patient: Marilyn Rivas Note: All result statuses are Final unless otherwise noted.  Tests: (1) Colonoscopy (COL)   COL Colonoscopy           DONE     [facility]     [address]     [place], [place]  [address]           COLONOSCOPY PROCEDURE REPORT           PATIENT:  Marilyn Rivas, Marilyn Rivas  MR#:  [id number]     BIRTHDATE:  12/24/1940, 69 yrs. old  GENDER:  female     ENDOSCOPIST:  Wilhemina Bonito. Eda Keys, MD     REF. BY:  Screening Recall     PROCEDURE DATE:  10/26/2010     PROCEDURE:  Surveillance Colonoscopy     ASA CLASS:  Class II     INDICATIONS:  history of pre-cancerous (adenomatous) colon polyps,     surveillance and high-risk screening ; last exam 2006 (Dr Virginia Rochester) w/     TA     MEDICATIONS:   Fentanyl 50 mcg IV, Versed 7 mg IV           DESCRIPTION OF PROCEDURE:   After the risks benefits and     alternatives of the procedure were thoroughly explained, informed     consent was obtained.  Digital rectal exam was performed and     revealed no abnormalities.   The LB 180AL [id number] endoscope was     introduced through the anus and advanced to the cecum, which was     identified by both the appendix and ileocecal valve, without     limitations.Time to cecum = 2:47 min.  The quality of the prep was     excellent, using MoviPrep.  The instrument was then slowly     withdrawn (time = 8:15 min) as the colon was fully examined.     <<PROCEDUREIMAGES>>           FINDINGS:  Severe diverticulosis was found throughout the colon.     Otherwise normal colonoscopy without  polyps, masses, vascular     ectasias, or inflammatory changes.   Retroflexed views in the     rectum revealed no abnormalities.    The scope was then withdrawn     from the patient and the procedure completed.           COMPLICATIONS:  None           ENDOSCOPIC IMPRESSION:     1) Severe diverticulosis throughout the colon     2) Otherwise normal colonoscopy        RECOMMENDATIONS:     1) Follow up colonoscopy in 5 years           ______________________________     Wilhemina Bonito. Eda Keys, MD           CC:  The Patient; Guerry Bruin, MD           n.     eSIGNED:   Wilhemina Bonito. Eda Keys at 10/26/2010 09:41 AM           Marcene Brawn, [id number]  Note: An exclamation mark (!) indicates a result that was not dispersed into the flowsheet. Document Creation Date: 10/26/2010 9:41 AM _______________________________________________________________________  (1) Order result status: Final Collection or observation date-time: 10/26/2010 09:34 Requested date-time:  Receipt date-time:  Reported date-time:  Referring Physician:   Ordering Physician: Fransico Setters [phone]) Specimen Source:  Source: Launa Grill Order Number: [id number] Lab site:   Appended Document: Colonoscopy    Clinical Lists Changes  Observations: Added new observation of COLONNXTDUE: 09/2015 (10/26/2010 13:01)

## 2011-01-06 ENCOUNTER — Encounter (INDEPENDENT_AMBULATORY_CARE_PROVIDER_SITE_OTHER): Payer: Medicare Other | Admitting: Obstetrics and Gynecology

## 2011-01-06 DIAGNOSIS — N951 Menopausal and female climacteric states: Secondary | ICD-10-CM

## 2011-01-06 DIAGNOSIS — N952 Postmenopausal atrophic vaginitis: Secondary | ICD-10-CM

## 2011-01-11 NOTE — Assessment & Plan Note (Signed)
[facility]                            CARDIOLOGY OFFICE NOTE   KOURTNEE, LAHEY                   MRN:          [id number]  DATE:02/09/2007                            DOB:          1941-05-20    Ms. Renteria returns today for further management of her cardiac  risk factors and hyperlipidemia.  Please see my note from December 19, 2006.   On Lipitor 10 mg daily, her total cholesterol dropped from 250 to 197,  triglycerides have remained the same at 179.  HDL is at 38.6, LDL has  dropped from 181 to 123.  LFTs, this time, were normal except for a  slightly elevated bilirubin.  Her alk phos is normal.  Specifically, her  transaminases are normal.   She asked to check her TSH, as well, on Synthroid.  It was normal.   I had a long talk with Ms. Self today.  I have recommended we  continue to monitor her blood pressure which has been good, continue  therapeutic lifestyle changes, and continue with low-dose Lipitor.  With  her low-risk status at this point, I think an LDL of less than 130 is  reasonable.  I also do not think she will tolerate a higher dose.   I will see her back in a year.     Thomas C. Daleen Squibb, MD, [facility]  Electronically Signed    TCW/MedQ  DD: 02/09/2007  DT: 02/10/2007  Job #: [id number]   cc:   Reuel Boom L. Eda Paschal, M.D.  Oley Balm Georgina Pillion, M.D.

## 2011-01-11 NOTE — Assessment & Plan Note (Signed)
[facility]                         GASTROENTEROLOGY OFFICE NOTE   Marilyn Rivas, Marilyn Rivas                   MRN:          [id number]  DATE:04/19/2007                            DOB:          09-26-1940    Patient is self-referred.   REASON FOR CONSULTATION:  Problems with recurrent diverticulitis.   HISTORY:  This is a 70 year old white female with a history of  gastroesophageal reflux disease, recurrent diverticulitis,  hypothyroidism, and dyslipidemia.  She refers herself for evaluation of  problems with recurrent diverticulitis as well as to establish as a new  GI patient.  She reports her GI history dating back approximately five  years, when while visiting in [place], she developed acute diverticulitis  and was hospitalized.  After appropriate treatment, she recovered  uneventfully.  She had several other episodes since that time.  She had  one episode last year and two episodes this year.  No problems since  this past March.  She generally develops focal left lower quadrant pain  and requires antibiotics for relief.  I have reviewed some old records,  and there is a CAT scan from early 2007, demonstrating diverticulitis.  She has colonoscopy on at least two prior occasions.  In July, 2002 with  Dr. Charna Elizabeth, she was found to have diverticulosis, diminutive polyp,  and hemorrhoids.  She also underwent colonoscopy with Dr. Sabino Gasser in  December, 2007.  I do not have a copy of that report, although his  office note states she had an adenomatous colon polyp.  She also  underwent an upper endoscopy in 2003.  This was essentially normal.   In general, she does well with regular bowel habits and no bleeding.  She does have occasional gas and bloating.  She does feel some abdominal  symptoms are exacerbated by stress, particularly as it relates to one of  her children.   PAST MEDICAL HISTORY:  As above.   PAST SURGICAL HISTORY:  1. Removal of  ovary, fallopian tube, and appendix due to unspecified      peritonitis.  2. Repair of left patella.  3. Cervical disk fusion.  4. Lumbar disk surgery.   ALLERGIES:  1. PENICILLIN.  2. MYCIN DERIVATIVES.  3. ASPIRIN.   CURRENT MEDICATIONS:  1. Boniva once monthly.  2. Synthroid 100 mcg daily.  3. Calcium with D.  4. Nexium 40 mg daily p.r.n.  5. Allegra p.r.n.   FAMILY HISTORY:  No family history of gastrointestinal malignancy.   SOCIAL HISTORY:  Patient is divorced with two daughters.  She lives  alone.  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  She does not smoke.  Occasionally uses alcohol.   REVIEW OF SYSTEMS:  Per diagnostic evaluation form.   PHYSICAL EXAMINATION:  A well-appearing female in no acute distress.  She is alert and oriented.  Blood pressure is 118/84.  Heart rate is 60.  Weight is 167 pounds.  She  is 5 feet 1-1/2 inches in height.  HEENT:  Sclerae are anicteric.  Conjunctivae are pink.  Oral  mucosa  intact.  There is no adenopathy.  LUNGS:  Clear.  HEART:  Regular.  ABDOMEN:  Soft, nontender, nondistended with good bowel sounds.  No  organomegaly, mass, or hernia.  EXTREMITIES:  Without edema.   IMPRESSION:  1. Diverticulosis with recurrent diverticulitis.  Currently      asymptomatic.  We had a long discussion today regarding the      etiology of diverticulitis as well as treatment strategies,      including medical and surgical therapies.  2. Gastroesophageal reflux disease.  Symptoms controlled with proton      pump inhibitors.  3. History of adenomatous colon polyps.  Last colonoscopy in December,      2007.  4. General medical problems under the care of Dr. Georgina Pillion.   RECOMMENDATIONS:  1. High-fiber diet.  2. Prophylactic prescription for ciprofloxacin and Flagyl provided,      should she have a bout of diverticulitis and not be able to get      medical assistance quickly.   If she were to treat herself with      antibiotics, she has been strictly instructed to contact this      office for documentation purposes as well as possible request for      evaluation.  3. Tentative plans for colonoscopy in December, 2012.  4. Continue Nexium.  5. Ongoing general medical care with Dr. Georgina Pillion.     Wilhemina Bonito. Marina Goodell, MD  Electronically Signed   JNP/MedQ  DD: 04/19/2007  DT: 04/20/2007  Job #: [id number]   cc:   Oley Balm. Georgina Pillion, M.D.

## 2011-01-14 NOTE — Assessment & Plan Note (Signed)
[facility]                            CARDIOLOGY OFFICE NOTE   DANELI, BUTKIEWICZ                   MRN:          [id number]  DATE:12/19/2006                            DOB:          1940/11/06    Marilyn Rivas comes in today with questions about going back on  Lipitor for her mixed hyperlipidemia.   Please see my original note and evaluation from August 01, 2003.   She had an excellent response to Lipitor with a minimal elevation in her  SGOT and SGPT.   Her supplemental insurance plan recommended she switch to simvastatin to  save money after she went into the doughnut hole with Medicare. She was  placed on simvastatin 20 mg a day. This showed her cholesterol to be  222, triglycerides 197, LDL of 140 with an elevated AST of 58, ALT of  63. Of note, her direct HDL was at 50.   Her simvastatin at that point was increased to 40 mg daily. She began to  have myalgias and arthralgias and stopped the drug herself. She has been  off of it now for several weeks.   I have repeated her lipids off of simvastatin per her request and it was  now 250, triglycerides 177, HDL 41.4, LDL of 181.9, which is back near  her baseline. Her ALT was still elevated at 65.   She gives no history of liver disease. She is overweight and could have  some fatty liver.   She really wants to go back on Lipitor after we had a long conversation  today. I think her risk now is a moderate risk with her age now at 56,  family history, obesity, sedentary lifestyle and hyperlipidemia.   After about a 20 minute discussion, we placed her on Lipitor 10 mg p.o.  daily. Will check fasting lipids and LFTs in six weeks. I will see her  back in 8 weeks to answer any further questions. We also have to follow  her LFTs very closely. I explained to her that we would stop the drug or  any statin if her LFTs were 3x normal or baseline.   She has been having some dyspnea on  exertion. She may need a stress  test. Will discuss this further per her request when she returns.     Thomas C. Daleen Squibb, MD, [facility]  Electronically Signed    TCW/MedQ  DD: 12/19/2006  DT: 12/19/2006  Job #: [id number]   cc:   Emeterio Reeve, MD  Rande Brunt. Eda Paschal, M.D.

## 2011-01-14 NOTE — Procedures (Signed)
[employer]  Patient:    Marilyn Rivas, Marilyn Rivas                   MRN: [id number] Proc. Date: 03/20/01 Attending:  Anselmo Rod, M.D. CC:         Rande Brunt. Eda Paschal, M.D.   Procedure Report  DATE OF BIRTH:  May 18, 1931  REFERRING PHYSICIAN:  Rande Brunt. Eda Paschal, M.D.  PROCEDURE PERFORMED:  Colonoscopy with hot biopsies x 3.  ENDOSCOPIST:  Anselmo Rod, M.D.  INSTRUMENT USED:  Olympus video colonoscope (pediatric).  INDICATIONS FOR PROCEDURE:  Rectal bleeding and change in bowel habits in a 70 year old white female rule out colonic polyps, masses, hemorrhoids, etc.  PREPROCEDURE PREPARATION:  Informed consent was procured from the patient. The patient was fasted for eight hours prior to the procedure and prepped with a bottle of magnesium citrate and a gallon of NuLytely the night prior to the procedure.  PREPROCEDURE PHYSICAL:  The patient had stable vital signs.  Neck supple. Chest clear to auscultation.  S1, S2 regular.  Abdomen soft with normal abdominal bowel sounds.  DESCRIPTION OF PROCEDURE:  The patient was placed in the left lateral decubitus position and sedated with 30 mg of Demerol and 3 mg of Versed intravenously.  Once the patient was adequately sedated and maintained on low-flow oxygen and continuous cardiac monitoring, the Olympus video colonoscope was advanced from the rectum to the cecum with slight difficulty secondary to some residual stool in the colon. There were scattered diverticula throughout the colon including the cecum.  There was inspissated stool in some of the diverticula.  Small internal hemorrhoids were appreciated on retroflexion in the rectum.  A small sessile polyp was removed by hot biopsy at 60 cm.  No large masses or polyps were seen.  The patient tolerated the procedure well without complications.  The scope was advanced up to the cecum.  The ileocecal valve and the appendicular orifice  were clearly visualized and photographed.  IMPRESSION: 1. Scattered diverticulosis. 2. Small sessile polyp hot biopsied at 60 cm. 3. Small internal hemorrhoids.  RECOMMENDATIONS: 1. Await pathology results. 2. High fiber diet. 3. Outpatient follow-up in the next four weeks. DD:  03/20/01 TD:  03/20/01 Job: [id number]

## 2011-01-14 NOTE — Op Note (Signed)
NAME:  Marilyn, Rivas                    ACCOUNT NO.:  [id number]   MEDICAL RECORD NO.:  [id number]                   PATIENT TYPE:  OIB   LOCATION:  2899                                 FACILITY:  [facility]   PHYSICIAN:  Coletta Memos, M.D.                  DATE OF BIRTH:  1941-06-17   DATE OF PROCEDURE:  11/12/2002  DATE OF DISCHARGE:                                 OPERATIVE REPORT   PREOPERATIVE DIAGNOSES:  1. Displaced disk, L5-S1 right.  2. Right S1 radiculopathy.   POSTOPERATIVE DIAGNOSES:  1. Displaced disk, L5-S1 right.  2. Right S1 radiculopathy.   PROCEDURE:  Right L5-S1 diskectomy with microdissection.   COMPLICATIONS:  None.   SURGEON:  Coletta Memos, M.D.   ASSISTANT:  Payton Doughty, M.D.   ANESTHESIA:  General endotracheal.   INDICATIONS:  The patient is a 70 year old patient of mine, whom I have done  an anterior cervical diskectomy on in the past.  She presented with an  approximately two-month history of severe pain in the back and right lower  extremity.  MRI shows a herniated disk.  She has undergone chiropractic  treatment and other conservative measures without success.  She has agreed,  therefore, to undergo diskectomy.   DESCRIPTION OF PROCEDURE:  The patient was brought to the operating room,  intubated, and placed under general anesthesia without difficulty.  She was  rolled prone onto a Wilson frame and all pressure points were properly  padded.  The back was prepped and she was draped in a sterile fashion.  Preoperative x-ray was used for localization, then I injected 10 mL of 0.5%  lidocaine and 1:200,000 strength epinephrine into my proposed incision.  I  made the incision with a #10 blade and took this down to the thoracolumbar  fascia.  I then opened a semicircular flap from the spinous process of L5 to  S1.  I retracted that medially and then exposed the laminae of what I  believed to be L5 and S1.  I took an x-ray after placing a  double-ended  ganglion knife inferior to my superiorly exposed lamina, and that was L5.  Having confirmed my location, I then brought the microscope into position.  I removed the ligamentum flavum to expose the thecal sac and epidural area  between L5 and S1 on the right side.  I retracted that medially and  encountered a large disk herniation.  I opened the disk space with a #15  blade and then proceeded with progressive diskectomy using the pituitary  rongeurs and Epstein curettes.  Dr. Channing Mutters assisted during the procedure.  This  was done with microdissection.  After adequately decompressing the nerve  root, I then inspected the S1 nerve root and its neural foramen.  I felt  that there was no pressure on the nerve root, nor were there any remaining  pieces  of the disk which I thought could be  compromised easily.  I then irrigated  the wound.  The wound was then closed in a layered fashion using Vicryl  sutures.  Dr. Channing Mutters assisted with the closure.  Dermabond was used for a  sterile dressing.  The patient tolerated the procedure well.  She was  extubated, rolled supine, moving all extremities.                                               Coletta Memos, M.D.    KC/MEDQ  D:  11/12/2002  T:  11/12/2002  Job:  [id number]

## 2011-01-14 NOTE — Op Note (Signed)
   NAME:  Marilyn Rivas, Marilyn Rivas                    ACCOUNT NO.:  [id number]   MEDICAL RECORD NO.:  [id number]                   PATIENT TYPE:  AMB   LOCATION:  ENDO                                 FACILITY:  [facility]   PHYSICIAN:  Georgiana Spinner, M.D.                 DATE OF BIRTH:  September 29, 1940   DATE OF PROCEDURE:  DATE OF DISCHARGE:                                 OPERATIVE REPORT   PROCEDURE:  Upper endoscopy.   INDICATIONS FOR PROCEDURE:  Gastroesophageal reflux disease.   ANESTHESIA:  Demerol 40, Versed 4 mg.   DESCRIPTION OF PROCEDURE:  With the patient mildly sedated in the left  lateral decubitus position, the Olympus videoscopic endoscope was inserted  in the mouth and passed under direct vision through the esophagus and a  careful inspection of the distal esophagus revealed no evidence of Barrett's  esophagus. We entered into the stomach. The fundus, body, antrum, duodenal  bulb and second portion of the duodenum all appeared normal. From this  point, the endoscope was slowly withdrawn taking circumferential views of  the entire duodenal mucosa until the endoscope was then pulled back into the  stomach, placed in retroflexion to view the stomach from below and an  incomplete wrap of the GE  junction around the endoscope was seen which was  probable cause for the patient's reflux symptomatology. The endoscope was  then straightened and withdrawn taking circumferential views of the  remaining gastric and esophageal mucosa. The patient's vital signs and pulse  oximeter remained stable. The patient tolerated the procedure well without  apparent complications.   FINDINGS:  Incomplete wrap of the GE  junction around the endoscope  compatible with small hiatal hernia, otherwise, an unremarkable examination.   PLAN:  Treat patient for her reflux symptomatology.                                                Georgiana Spinner, M.D.    GMO/MEDQ  D:  08/14/2002  T:  08/14/2002  Job:   [id number]   cc:   Reuel Boom L. Eda Paschal, M.D.  [address]  [place]  [place] [address]  Fax: [phone]

## 2011-03-23 ENCOUNTER — Encounter: Payer: Self-pay | Admitting: Obstetrics and Gynecology

## 2011-07-28 ENCOUNTER — Ambulatory Visit: Payer: Medicare Other | Admitting: Internal Medicine

## 2011-08-02 ENCOUNTER — Encounter: Payer: Self-pay | Admitting: Emergency Medicine

## 2011-09-07 ENCOUNTER — Encounter: Payer: Self-pay | Admitting: Internal Medicine

## 2011-09-07 DIAGNOSIS — M5416 Radiculopathy, lumbar region: Secondary | ICD-10-CM | POA: Insufficient documentation

## 2011-09-07 DIAGNOSIS — M1710 Unilateral primary osteoarthritis, unspecified knee: Secondary | ICD-10-CM | POA: Insufficient documentation

## 2011-09-07 DIAGNOSIS — M8580 Other specified disorders of bone density and structure, unspecified site: Secondary | ICD-10-CM | POA: Insufficient documentation

## 2011-09-07 DIAGNOSIS — E039 Hypothyroidism, unspecified: Secondary | ICD-10-CM | POA: Insufficient documentation

## 2011-09-07 DIAGNOSIS — N952 Postmenopausal atrophic vaginitis: Secondary | ICD-10-CM | POA: Insufficient documentation

## 2011-09-07 DIAGNOSIS — N870 Mild cervical dysplasia: Secondary | ICD-10-CM | POA: Insufficient documentation

## 2011-09-07 DIAGNOSIS — M179 Osteoarthritis of knee, unspecified: Secondary | ICD-10-CM | POA: Insufficient documentation

## 2011-09-08 ENCOUNTER — Ambulatory Visit: Payer: Medicare Other | Admitting: Internal Medicine

## 2011-09-12 ENCOUNTER — Encounter: Payer: Self-pay | Admitting: Internal Medicine

## 2011-09-29 ENCOUNTER — Ambulatory Visit: Payer: Medicare Other | Admitting: Internal Medicine

## 2011-10-03 DIAGNOSIS — H04129 Dry eye syndrome of unspecified lacrimal gland: Secondary | ICD-10-CM | POA: Diagnosis not present

## 2011-10-04 ENCOUNTER — Telehealth: Payer: Self-pay | Admitting: Internal Medicine

## 2011-10-04 ENCOUNTER — Encounter: Payer: Self-pay | Admitting: Internal Medicine

## 2011-10-04 ENCOUNTER — Ambulatory Visit (INDEPENDENT_AMBULATORY_CARE_PROVIDER_SITE_OTHER): Payer: Medicare Other | Admitting: Internal Medicine

## 2011-10-04 VITALS — BP 145/86 | HR 55 | Temp 97.1°F | Ht 61.5 in | Wt 164.0 lb

## 2011-10-04 DIAGNOSIS — E785 Hyperlipidemia, unspecified: Secondary | ICD-10-CM

## 2011-10-04 DIAGNOSIS — M899 Disorder of bone, unspecified: Secondary | ICD-10-CM | POA: Diagnosis not present

## 2011-10-04 DIAGNOSIS — E039 Hypothyroidism, unspecified: Secondary | ICD-10-CM | POA: Diagnosis not present

## 2011-10-04 DIAGNOSIS — N898 Other specified noninflammatory disorders of vagina: Secondary | ICD-10-CM | POA: Diagnosis not present

## 2011-10-04 DIAGNOSIS — M8580 Other specified disorders of bone density and structure, unspecified site: Secondary | ICD-10-CM

## 2011-10-04 DIAGNOSIS — M949 Disorder of cartilage, unspecified: Secondary | ICD-10-CM | POA: Diagnosis not present

## 2011-10-04 DIAGNOSIS — Z23 Encounter for immunization: Secondary | ICD-10-CM

## 2011-10-04 LAB — POCT URINALYSIS DIPSTICK
Bilirubin, UA: NEGATIVE
Glucose, UA: NEGATIVE
Ketones, UA: NEGATIVE
Leukocytes, UA: NEGATIVE
Nitrite, UA: NEGATIVE
Protein, UA: NEGATIVE
Urobilinogen, UA: NEGATIVE
pH, UA: 5

## 2011-10-04 MED ORDER — METRONIDAZOLE 500 MG PO TABS: ORAL_TABLET | ORAL | Status: DC

## 2011-10-04 NOTE — Progress Notes (Signed)
Subjective:    Patient ID: Marilyn Rivas, female    DOB: 11-Feb-1941, 71 y.o.   MRN: [id number]  HPI  New pt. Here for first visit.  Former care Dr. Wylene Simmer.  PMH of Hyperlipidemia, hypothyroidism, osteopenia, GERD, colonic polyps, DJD, CINI, atrophic vaginiits, and lumbar radiculopathy.   Marilyn Rivas is here with two concerns. She has a sore throat over last several days with posterior pharyngeal drainage.  No fever no cough or chest pain  She also has a malodorous vaginal discharge over last several days.  Thin clear discharge  No dysuria or flank pain  UTD on pap smear with Dr. Oletha Blend.   Allergies  Allergen Reactions  . Aspirin     REACTION: severe abdominal pain  . Atorvastatin Other (See Comments)    myalgia  . Adhesive (Tape) Rash  . Penicillins Rash   Past Medical History  Diagnosis Date  . Thyroid disease   . Hyperlipidemia   . Menopause   . Osteopenia    Past Surgical History  Procedure Date  . Appendectomy 1977  . Right oophorectomy 1977  . Patella reconstruction 1994    left  . Cervical fusion 1999    C5-7  . Lumbar disc surgery 2004    L5   History   Social History  . Marital Status: Single    Spouse Name: N/A    Number of Children: N/A  . Years of Education: N/A   Occupational History  . Not on file.   Social History Main Topics  . Smoking status: Former Games developer  . Smokeless tobacco: Not on file  . Alcohol Use: Not on file  . Drug Use: Not on file  . Sexually Active: Not on file   Other Topics Concern  . Not on file   Social History Narrative  . No narrative on file   Family History  Problem Relation Age of Onset  . Allergies Mother   . Heart disease Father    Patient Active Problem List  Diagnoses  . HYPERLIPIDEMIA-MIXED  . CONSTIPATION  . PERSONAL HX COLONIC POLYPS  . GERD  . DIVERTICULITIS, COLON  . Osteopenia  . Hypothyroidism  . DJD (degenerative joint disease)  . CIN I (cervical intraepithelial neoplasia I)  . Atrophic  vaginitis  . Radiculopathy of lumbar region   Current Outpatient Prescriptions on File Prior to Visit  Medication Sig Dispense Refill  . SYNTHROID 100 MCG tablet Take 1 tablet by mouth daily.            Review of Systems See HPI    Objective:   Physical Exam Physical Exam  Constitutional: She is oriented to person, place, and time. She appears well-developed and well-nourished. She is cooperative.  HENT:  Head: Normocephalic and atraumatic.  Right Ear: A middle ear effusion is present.  Left Ear: A middle ear effusion is present.  Nose: Mucosal edema present.  Mouth/Throat: Oropharyngeal exudate and posterior oropharyngeal erythema present.  Serous effusion bilaterally  Eyes: Conjunctivae and EOM are normal. Pupils are equal, round, and reactive to light.  Neck: Neck supple. Carotid bruit is not present. No mass present.  Cardiovascular: Regular rhythm, normal heart sounds, intact distal pulses and normal pulses. Exam reveals no gallop and no friction rub.  No murmur heard.  Pulmonary/Chest: Breath sounds normal. She has no wheezes. She has no rhonchi. She has no rales.  Lymphadenopathy:  She has cervical adenopathy.  Neurological: She is alert and oriented to person, place, and time.  Skin: Skin  is warm and dry. No abrasion, no bruising, no ecchymosis and no rash noted. No cyanosis. Nails show no clubbing.  Psychiatric: She has a normal mood and affect. Her speech is normal and behavior is normal.  Pelvic:   Normal ext genitalia  Thin clear vaginal discharge  No vaginal or cervical lesions             Assessment & Plan:  1)  Pharyngitis  Most likely viral  Advil cepacol spray  Call if not better 2)  Vaginal discharge.  Wet prep no trich, no fungal elements.  Few clue cells  Will empirically treat for vaginosis with Flagyl 500 bid.  Advised no ETOH 3)  Hyperlipidemia  Will get labs when fasting 4) hypothyroidism  Check TSH  See complete problem list  She has [facility]  scheduled

## 2011-10-04 NOTE — Telephone Encounter (Signed)
Spoke with [employer].  She is aware we can do an acute care visit for current problems, then complete physical at scheduled appt on 10/27/11.  Appt schd for today at 1045am

## 2011-10-04 NOTE — Telephone Encounter (Signed)
Marilyn Rivas, DOB 07-09-2041, from [phone], Called today at 8:50 a.m. Stating that she has an appointment for 10/27/11 however she has had a sore throat, has been fatigued and a vaginal odor for about a week.  She has Medicare so she is not sure if an urgent care will accept her insurance.  Can we see her any earlier?

## 2011-10-04 NOTE — Patient Instructions (Signed)
Obtain fasting labs   Keep appt with cpe  Do not drink Etoh while taking flagyl

## 2011-10-17 ENCOUNTER — Telehealth: Payer: Self-pay | Admitting: Internal Medicine

## 2011-10-17 NOTE — Telephone Encounter (Signed)
Spoke with [place], she is aware it is fine to go to [facility].  Spoke with [facility] [facility], they are open from 7a-6pm and on Saturdays.  Gave location and time information to [place].  She is agreeable and will go there for labs rather than driving here to [facility]

## 2011-10-17 NOTE — Telephone Encounter (Signed)
Patient called this morning and said that it was inconvenient to go to lab here and would like to go to lab at [facility] if possible and have results faxed to Dr. Constance Goltz if possible.  She has lab order already and an appointment at 10/27/11 at 2:15 pm.  Is this ok?  Dr. Constance Goltz asked me to give this to you.

## 2011-10-18 DIAGNOSIS — E785 Hyperlipidemia, unspecified: Secondary | ICD-10-CM | POA: Diagnosis not present

## 2011-10-18 DIAGNOSIS — M899 Disorder of bone, unspecified: Secondary | ICD-10-CM | POA: Diagnosis not present

## 2011-10-18 DIAGNOSIS — E039 Hypothyroidism, unspecified: Secondary | ICD-10-CM | POA: Diagnosis not present

## 2011-10-19 LAB — CBC WITH DIFFERENTIAL/PLATELET
Basophils Relative: 1 % (ref 0–1)
Eosinophils Absolute: 0.3 10*3/uL (ref 0.0–0.7)
Eosinophils Relative: 5 % (ref 0–5)
HCT: 45.3 % (ref 36.0–46.0)
Lymphs Abs: 2.7 10*3/uL (ref 0.7–4.0)
MCH: 32.1 pg (ref 26.0–34.0)
MCHC: 33.8 g/dL (ref 30.0–36.0)
Monocytes Absolute: 0.3 10*3/uL (ref 0.1–1.0)
Neutrophils Relative %: 45 % (ref 43–77)

## 2011-10-19 LAB — VITAMIN D 25 HYDROXY (VIT D DEFICIENCY, FRACTURES): Vit D, 25-Hydroxy: 50 ng/mL (ref 30–89)

## 2011-10-19 LAB — COMPREHENSIVE METABOLIC PANEL
Albumin: 4.3 g/dL (ref 3.5–5.2)
BUN: 15 mg/dL (ref 6–23)
Calcium: 9.7 mg/dL (ref 8.4–10.5)
Chloride: 107 mEq/L (ref 96–112)
Creat: 0.77 mg/dL (ref 0.50–1.10)
Potassium: 4.3 mEq/L (ref 3.5–5.3)
Sodium: 140 mEq/L (ref 135–145)
Total Bilirubin: 0.9 mg/dL (ref 0.3–1.2)
Total Protein: 6.3 g/dL (ref 6.0–8.3)

## 2011-10-19 LAB — LIPID PANEL
LDL Cholesterol: 198 mg/dL — ABNORMAL HIGH (ref 0–99)
Triglycerides: 166 mg/dL — ABNORMAL HIGH (ref <150)
VLDL: 33 mg/dL (ref 0–40)

## 2011-10-27 ENCOUNTER — Ambulatory Visit (INDEPENDENT_AMBULATORY_CARE_PROVIDER_SITE_OTHER): Payer: Medicare Other | Admitting: Internal Medicine

## 2011-10-27 ENCOUNTER — Encounter: Payer: Self-pay | Admitting: Internal Medicine

## 2011-10-27 VITALS — BP 130/86 | HR 72 | Temp 97.5°F | Ht 61.5 in | Wt 162.0 lb

## 2011-10-27 DIAGNOSIS — E039 Hypothyroidism, unspecified: Secondary | ICD-10-CM | POA: Diagnosis not present

## 2011-10-27 DIAGNOSIS — E785 Hyperlipidemia, unspecified: Secondary | ICD-10-CM

## 2011-10-27 DIAGNOSIS — Z86010 Personal history of colonic polyps: Secondary | ICD-10-CM | POA: Diagnosis not present

## 2011-10-27 DIAGNOSIS — H109 Unspecified conjunctivitis: Secondary | ICD-10-CM | POA: Diagnosis not present

## 2011-10-27 DIAGNOSIS — R5383 Other fatigue: Secondary | ICD-10-CM

## 2011-10-27 DIAGNOSIS — R5381 Other malaise: Secondary | ICD-10-CM

## 2011-10-27 MED ORDER — ATORVASTATIN CALCIUM 10 MG PO TABS: ORAL_TABLET | ORAL | Status: DC

## 2011-10-27 MED ORDER — LEVOTHYROXINE SODIUM 112 MCG PO TABS: 100.0000 ug | ORAL_TABLET | Freq: Every day | ORAL | Status: DC

## 2011-10-27 MED ORDER — ATORVASTATIN CALCIUM 10 MG PO TABS: 10.0000 mg | ORAL_TABLET | Freq: Every day | ORAL | Status: DC

## 2011-10-27 NOTE — Patient Instructions (Signed)
Will set up allergy referral  Take new dose of thyroid medicine  See me in 6-8 weeks.  Take lipitor every other day

## 2011-10-27 NOTE — Progress Notes (Signed)
Subjective:    Patient ID: Marilyn Rivas, female    DOB: Jan 13, 1941, 71 y.o.   MRN: [id number]  HPI Marilyn Rivas is here for comprehensive eval.   She notes that she feels fatigued and does not seem to be getting better.  No chest pain, some DOE, no PND, no palpitations, no dizziness.  She knows that her cholesterol is very high and has been that way for quite a long time.  She did see Dr. Daleen Squibb in the past and was "told not to come back".  She had tried herbal  Remedies for her cholesterol but this did not help.  FH pos for heart disease in father.  Pt quit smoking years ago per her report.  She reports occasional sharp pain in R arm but she went to an acupuncturist a few weeks ago and this helped. When she took Lipitor her "fingers couldn't move"    See labs.  TSH elevated and pt reports she is taking her synthroid daily   She does not wish a pap smear today.  She is up to date on her mammogram.  She does not wish a bone density as two of her friends had atypical fractures on bisphosphanates and she does not want to take these meds.    Her eyes are chronically red and she would like allergy testing.  She may be allergic to cats  Allergies  Allergen Reactions  . Aspirin     REACTION: severe abdominal pain  . Atorvastatin Other (See Comments)    myalgia  . Adhesive (Tape) Rash  . Penicillins Rash   Past Medical History  Diagnosis Date  . Thyroid disease   . Hyperlipidemia   . Menopause   . Osteopenia    Past Surgical History  Procedure Date  . Appendectomy 1977  . Right oophorectomy 1977  . Patella reconstruction 1994    left  . Cervical fusion 1999    C5-7  . Lumbar disc surgery 2004    L5   History   Social History  . Marital Status: Single    Spouse Name: N/A    Number of Children: N/A  . Years of Education: N/A   Occupational History  . Not on file.   Social History Main Topics  . Smoking status: Former Games developer  . Smokeless tobacco: Not on file  . Alcohol Use:  Not on file  . Drug Use: Not on file  . Sexually Active: Not on file   Other Topics Concern  . Not on file   Social History Narrative  . No narrative on file   Family History  Problem Relation Age of Onset  . Allergies Mother   . Heart disease Father    Patient Active Problem List  Diagnoses  . HYPERLIPIDEMIA-MIXED  . CONSTIPATION  . PERSONAL HX COLONIC POLYPS  . GERD  . DIVERTICULITIS, COLON  . Osteopenia  . Hypothyroidism  . DJD (degenerative joint disease)  . CIN I (cervical intraepithelial neoplasia I)  . Atrophic vaginitis  . Radiculopathy of lumbar region   Current Outpatient Prescriptions on File Prior to Visit  Medication Sig Dispense Refill  . Cholecalciferol (VITAMIN D3) 5000 UNITS TABS Take 1 tablet by mouth daily.      . Difluprednate (DUREZOL) 0.05 % EMUL Apply 1 drop to eye daily.      . Multiple Vitamins-Minerals (HAIR/SKIN/NAILS PO) Take 1 tablet by mouth daily.      . Nutritional Supplements (NUTRITIONAL SUPPLEMENT PO) Take 1 tablet by mouth  daily. Bio-tears      . OMEGA 3 1000 MG CAPS Take 1 capsule by mouth 2 (two) times daily.      . vitamin E 400 UNIT capsule Take 400 Units by mouth daily.             Review of Systems  Constitutional: Positive for fatigue. Negative for appetite change and unexpected weight change.  HENT: Negative for hearing loss, congestion and tinnitus.   Eyes: Positive for redness and itching. Negative for visual disturbance.  Respiratory: Negative for cough, chest tightness and wheezing.   Cardiovascular: Negative for chest pain, palpitations and leg swelling.  Gastrointestinal: Negative for abdominal pain and blood in stool.  Genitourinary: Negative for flank pain and pelvic pain.  Musculoskeletal: Negative for myalgias and joint swelling.  Neurological: Negative for tremors and light-headedness.       Objective:   Physical Exam Physical Exam  Nursing note and vitals reviewed.  Constitutional: She is oriented to  person, place, and time. She appears well-developed and well-nourished.  HENT:  Head: Normocephalic and atraumatic.  Right Ear: Tympanic membrane and ear canal normal. No drainage. Tympanic membrane is not injected and not erythematous.  Left Ear: Tympanic membrane and ear canal normal. No drainage. Tympanic membrane is not injected and not erythematous.  Nose: Nose normal. Right sinus exhibits no maxillary sinus tenderness and no frontal sinus tenderness. Left sinus exhibits no maxillary sinus tenderness and no frontal sinus tenderness.  Mouth/Throat: Oropharynx is clear and moist. No oral lesions. No oropharyngeal exudate.  Eyes: Conjunctivae bilateral redness and EOM are normal. Pupils are equal, round, and reactive to light.  Neck: Normal range of motion. Neck supple. No JVD present. Carotid bruit is not present. No mass and no thyromegaly present.  Cardiovascular: Normal rate, regular rhythm, S1 normal, S2 normal and intact distal pulses. Exam reveals no gallop and no friction rub.  No murmur heard.  Pulses:  Carotid pulses are 2+ on the right side, and 2+ on the left side.  Dorsalis pedis pulses are 2+ on the right side, and 2+ on the left side.  No carotid bruit. No LE edema  Pulmonary/Chest: Breath sounds normal. She has no wheezes. She has no rales. She exhibits no tenderness. Breasts no discrete masses no nipple discharge no axillary adenopathy bilaterally. Abdominal: Soft. Bowel sounds are normal. She exhibits no distension and no mass. There is no hepatosplenomegaly. There is no tenderness. There is no CVA tenderness.  REctal no mass guaiac neg. Musculoskeletal: Normal range of motion.  No active synovitis to joints.  Lymphadenopathy:  She has no cervical adenopathy.  She has no axillary adenopathy.  Right: No inguinal and no supraclavicular adenopathy present.  Left: No inguinal and no supraclavicular adenopathy present.  Neurological: She is alert and oriented to person, place,  and time. She has normal strength and normal reflexes. She displays no tremor. No cranial nerve deficit or sensory deficit. Coordination and gait normal.  Skin: Skin is warm and dry. No rash noted. No cyanosis. Nails show no clubbing.  Psychiatric: She has a normal mood and affect. Her speech is normal and behavior is normal. Cognition and memory are normal.           Assessment & Plan:  1)  Hyperlipidemia: Long talk about cardiac risk factors.   Pt agreeable to try low dose Lipitor qod for the next 6-8 weeks.  Will recheck fasting lipids on next visit 2)  Hypothyroidism:  Under-replaced  Will change synthroid to  112 mcg recheck 8 weeks 3)  Fatigue:  May be multifactorial.  I Offered cardiology referral for possible arteriogram or further diagnostic testing but she declines at this point.   EKG sinus bradycardia with TWI laterally will need old EKG for comparison. 4)  Bilarteral conjunctivitis:  May be allergy related.  Will refer for testing.   5)  Colonic polyps  UTD with colonoscopy  Repeat 2017 6)  Diverticulosis  See problem list.  I spent 45 minutes with this pt

## 2011-11-03 ENCOUNTER — Telehealth: Payer: Self-pay | Admitting: Internal Medicine

## 2011-11-03 NOTE — Telephone Encounter (Signed)
[facility]ia called, EKG received

## 2011-11-03 NOTE — Telephone Encounter (Signed)
Call [facility] cardiology and have them fax copy of Colin's last EKG.  She has seen Dr. Daleen Squibb in the past there   Thanks

## 2011-11-14 DIAGNOSIS — J309 Allergic rhinitis, unspecified: Secondary | ICD-10-CM | POA: Diagnosis not present

## 2011-11-15 ENCOUNTER — Telehealth: Payer: Self-pay | Admitting: Internal Medicine

## 2011-11-15 ENCOUNTER — Encounter: Payer: Self-pay | Admitting: Internal Medicine

## 2011-11-15 DIAGNOSIS — R001 Bradycardia, unspecified: Secondary | ICD-10-CM | POA: Insufficient documentation

## 2011-11-15 NOTE — Telephone Encounter (Signed)
Spoke with Marilyn Rivas, she is aware of results of bone density and agreeable to Ca++ and Vitamin D recommendations.  She also notes that she started Lipitor then stopped taking it.  She states that she is not sure if it was the Lipitor or her thyroid problems, but she was beginning to feel too tired to get out of the bed.  She states her plan is to "get my thyroid straightened out first, then retry the Lipitor".  She states she prefers to tackle one problem at a time instead of starting multiple new medications at one time.  She also reports that her saw her allergist who started her on Claritin 10mg  daily for allergies.

## 2011-11-15 NOTE — Telephone Encounter (Signed)
Call pt and let her know that her bone density test shows osteopenia  No osteoporosis  Just take her calcium 1200-1500 mg with Vitamin D 800-1000units daily

## 2011-11-16 ENCOUNTER — Encounter: Payer: Self-pay | Admitting: Internal Medicine

## 2011-11-16 NOTE — Telephone Encounter (Signed)
Just advise pt to keep her follow up appt with me in April.  If she sees Dr. Talmage Nap her endocrinologist before then, she can discuss option for her cholesterol with Dr. Talmage Nap.  She seem to be very intolerant of statin meds.    Please get Dr. Willeen Cass last office note  thanks

## 2011-11-16 NOTE — Telephone Encounter (Signed)
Left message for Marilyn Rivas at Dr. Willeen Rivas office for her to send notes from Marilyn Rivas's visit with Dr. Talmage Rivas

## 2011-11-17 ENCOUNTER — Telehealth: Payer: Self-pay | Admitting: Internal Medicine

## 2011-11-17 DIAGNOSIS — E039 Hypothyroidism, unspecified: Secondary | ICD-10-CM

## 2011-11-17 DIAGNOSIS — E785 Hyperlipidemia, unspecified: Secondary | ICD-10-CM

## 2011-11-17 NOTE — Telephone Encounter (Signed)
Call pt and see if she already has an endocrinologist that she likes or do we need to set her up?

## 2011-11-21 NOTE — Telephone Encounter (Signed)
Spoke with [employer].  She has seen Dr. Talmage Nap in the past, but does not wish to go back to see her again.  She is agreeable to see an endocrinologist on DDS recommendation.  Also wanted to let DDS know when she saw the allergist, the only thing she is allergic to is dust mites and mold.

## 2011-11-22 NOTE — Telephone Encounter (Signed)
Ok to see Dr. Casimiro Needle Altheimer  For both Hypothyroidism and hyperlipidemia

## 2011-11-24 NOTE — Telephone Encounter (Signed)
Records and referral faxed to Dr. Altheimer's office 11/22/11.  They will contact pt with appt information.  Will update referral as soon as appt information received back from their office

## 2011-12-06 DIAGNOSIS — E785 Hyperlipidemia, unspecified: Secondary | ICD-10-CM | POA: Diagnosis not present

## 2011-12-06 DIAGNOSIS — E0500 Thyrotoxicosis with diffuse goiter without thyrotoxic crisis or storm: Secondary | ICD-10-CM | POA: Diagnosis not present

## 2011-12-06 DIAGNOSIS — E039 Hypothyroidism, unspecified: Secondary | ICD-10-CM | POA: Diagnosis not present

## 2011-12-06 DIAGNOSIS — R5382 Chronic fatigue, unspecified: Secondary | ICD-10-CM | POA: Diagnosis not present

## 2011-12-08 ENCOUNTER — Other Ambulatory Visit: Payer: Self-pay | Admitting: Orthopedic Surgery

## 2011-12-08 DIAGNOSIS — M545 Low back pain: Secondary | ICD-10-CM

## 2011-12-08 DIAGNOSIS — I10 Essential (primary) hypertension: Secondary | ICD-10-CM | POA: Diagnosis not present

## 2011-12-08 DIAGNOSIS — IMO0002 Reserved for concepts with insufficient information to code with codable children: Secondary | ICD-10-CM | POA: Diagnosis not present

## 2011-12-14 ENCOUNTER — Ambulatory Visit
Admission: RE | Admit: 2011-12-14 | Discharge: 2011-12-14 | Disposition: A | Discharge status: Home / Self Care | Payer: Medicare Other | Source: Ambulatory Visit | Attending: Orthopedic Surgery | Admitting: Orthopedic Surgery

## 2011-12-14 DIAGNOSIS — M5126 Other intervertebral disc displacement, lumbar region: Secondary | ICD-10-CM | POA: Diagnosis not present

## 2011-12-14 DIAGNOSIS — M47817 Spondylosis without myelopathy or radiculopathy, lumbosacral region: Secondary | ICD-10-CM | POA: Diagnosis not present

## 2011-12-14 DIAGNOSIS — M545 Low back pain: Secondary | ICD-10-CM

## 2011-12-14 DIAGNOSIS — M5137 Other intervertebral disc degeneration, lumbosacral region: Secondary | ICD-10-CM | POA: Diagnosis not present

## 2011-12-14 MED ORDER — GADOBENATE DIMEGLUMINE 529 MG/ML IV SOLN
15.0000 mL | Freq: Once | INTRAVENOUS | Status: AC | PRN
  Administered 2011-12-14: 15 mL via INTRAVENOUS

## 2011-12-15 ENCOUNTER — Ambulatory Visit: Payer: Medicare Other | Admitting: Internal Medicine

## 2011-12-17 ENCOUNTER — Encounter: Payer: Self-pay | Admitting: Internal Medicine

## 2011-12-17 DIAGNOSIS — C4491 Basal cell carcinoma of skin, unspecified: Secondary | ICD-10-CM | POA: Insufficient documentation

## 2011-12-19 ENCOUNTER — Other Ambulatory Visit: Payer: Self-pay | Admitting: *Deleted

## 2011-12-19 MED ORDER — LEVOTHYROXINE SODIUM 112 MCG PO TABS: 112.0000 ug | ORAL_TABLET | Freq: Every day | ORAL | Status: DC

## 2011-12-19 NOTE — Telephone Encounter (Signed)
Received fax for refill request for synthroid, with message patient would like multiple refills on this prescription

## 2011-12-22 DIAGNOSIS — M48061 Spinal stenosis, lumbar region without neurogenic claudication: Secondary | ICD-10-CM | POA: Diagnosis not present

## 2011-12-22 DIAGNOSIS — IMO0002 Reserved for concepts with insufficient information to code with codable children: Secondary | ICD-10-CM | POA: Diagnosis not present

## 2011-12-29 ENCOUNTER — Encounter: Payer: Self-pay | Admitting: Internal Medicine

## 2011-12-29 ENCOUNTER — Ambulatory Visit (INDEPENDENT_AMBULATORY_CARE_PROVIDER_SITE_OTHER): Payer: Medicare Other | Admitting: Internal Medicine

## 2011-12-29 VITALS — BP 149/79 | HR 74 | Temp 97.9°F | Resp 16 | Ht 61.0 in | Wt 157.0 lb

## 2011-12-29 DIAGNOSIS — M899 Disorder of bone, unspecified: Secondary | ICD-10-CM | POA: Diagnosis not present

## 2011-12-29 DIAGNOSIS — E785 Hyperlipidemia, unspecified: Secondary | ICD-10-CM | POA: Diagnosis not present

## 2011-12-29 DIAGNOSIS — M8580 Other specified disorders of bone density and structure, unspecified site: Secondary | ICD-10-CM

## 2011-12-29 DIAGNOSIS — J029 Acute pharyngitis, unspecified: Secondary | ICD-10-CM | POA: Diagnosis not present

## 2011-12-29 DIAGNOSIS — M949 Disorder of cartilage, unspecified: Secondary | ICD-10-CM | POA: Diagnosis not present

## 2011-12-29 LAB — POCT RAPID STREP A (OFFICE): Rapid Strep A Screen: NEGATIVE

## 2011-12-29 MED ORDER — AZITHROMYCIN 250 MG PO TABS: ORAL_TABLET | ORAL | Status: AC

## 2011-12-29 NOTE — Patient Instructions (Signed)
Schedule pap/pelvic exam  Take meds as prescribed

## 2011-12-29 NOTE — Progress Notes (Signed)
Subjective:    Patient ID: Marilyn Rivas, female    DOB: July 27, 1941, 71 y.o.   MRN: [id number]  HPI  Reizy is here for acute visit.  She began with sore throat, nasal congestion and headache yesterday.  No documented fever.     She reports she knows she has osteoporosis but will not take any prescription meds for it.  She reports her teeth were breaking when she was on Fosamax.   She does take calcium and vitamin D  She has seen Dr. Leslie Dales for her high cholesterol.   She was intolerant of Lipitor and now is one low dose Crestor 2-3 times per week and tolerating well.  See scanned note  Allergies  Allergen Reactions  . Aspirin     REACTION: severe abdominal pain  . Atorvastatin Other (See Comments)    myalgia  . Adhesive (Tape) Rash  . Penicillins Rash   Past Medical History  Diagnosis Date  . Thyroid disease   . Hyperlipidemia   . Menopause   . Osteopenia    Past Surgical History  Procedure Date  . Appendectomy 1977  . Right oophorectomy 1977  . Patella reconstruction 1994    left  . Cervical fusion 1999    C5-7  . Lumbar disc surgery 2004    L5   History   Social History  . Marital Status: Single    Spouse Name: N/A    Number of Children: N/A  . Years of Education: N/A   Occupational History  . Not on file.   Social History Main Topics  . Smoking status: Former Games developer  . Smokeless tobacco: Not on file  . Alcohol Use: Not on file  . Drug Use: Not on file  . Sexually Active: Not on file   Other Topics Concern  . Not on file   Social History Narrative  . No narrative on file   Family History  Problem Relation Age of Onset  . Allergies Mother   . Heart disease Father    Patient Active Problem List  Diagnoses  . HYPERLIPIDEMIA-MIXED  . CONSTIPATION  . PERSONAL HX COLONIC POLYPS  . GERD  . DIVERTICULITIS, COLON  . Osteopenia  . Hypothyroidism  . DJD (degenerative joint disease)  . CIN I (cervical intraepithelial neoplasia I)  .  Atrophic vaginitis  . Radiculopathy of lumbar region  . Sinus bradycardia by electrocardiogram  . Basal cell cancer   Current Outpatient Prescriptions on File Prior to Visit  Medication Sig Dispense Refill  . cetirizine (ZYRTEC) 10 MG tablet Take 10 mg by mouth daily.      . Cholecalciferol (VITAMIN D3) 5000 UNITS TABS Take 1 tablet by mouth daily.      Marland Kitchen levothyroxine (SYNTHROID, LEVOTHROID) 112 MCG tablet Take 1 tablet (112 mcg total) by mouth daily.  90 tablet  1  . Nutritional Supplements (NUTRITIONAL SUPPLEMENT PO) Take 1 tablet by mouth daily. Bio-tears      . OMEGA 3 1000 MG CAPS Take 1 capsule by mouth 2 (two) times daily.      . rosuvastatin (CRESTOR) 5 MG tablet Take 5 mg by mouth 2 (two) times a week.      . vitamin E 400 UNIT capsule Take 400 Units by mouth daily.      . Multiple Vitamins-Minerals (HAIR/SKIN/NAILS PO) Take 1 tablet by mouth daily.             Review of Systems See HPI    Objective:   Physical  Exam Physical Exam  Constitutional: She is oriented to person, place, and time. She appears well-developed and well-nourished. She is cooperative.  HENT:  Head: Normocephalic and atraumatic.  Right Ear: A middle ear effusion is present.  Left Ear: A middle ear effusion is present.  Nose: Mucosal edema present.  Mouth/Throat: Oropharyngeal exudate and posterior oropharyngeal erythema present.  Serous effusion bilaterally  Eyes: Conjunctivae and EOM are normal. Pupils are equal, round, and reactive to light.  Neck: Neck supple. Carotid bruit is not present. No mass present.  Cardiovascular: Regular rhythm, normal heart sounds, intact distal pulses and normal pulses. Exam reveals no gallop and no friction rub.  No murmur heard.  Pulmonary/Chest: Breath sounds normal. She has no wheezes. She has no rhonchi. She has no rales.  Lymphadenopathy:  She has cervical adenopathy.  Neurological: She is alert and oriented to person, place, and time.  Skin: Skin is warm  and dry. No abrasion, no bruising, no ecchymosis and no rash noted. No cyanosis. Nails show no clubbing.  Psychiatric: She has a normal mood and affect. Her speech is normal and behavior is normal.       Assessment & Plan:  1)  Pharyngitis  Will give Zpak Delsym otc for cough 2)  Osteopenia  Will get bone density.  She repeatedly declines RX meds for treatment 3)  Hyperlipidemia  On crestor 2 times per week   Schedlue pap pelvic exam

## 2012-01-12 DIAGNOSIS — F4323 Adjustment disorder with mixed anxiety and depressed mood: Secondary | ICD-10-CM | POA: Diagnosis not present

## 2012-01-12 DIAGNOSIS — IMO0002 Reserved for concepts with insufficient information to code with codable children: Secondary | ICD-10-CM | POA: Diagnosis not present

## 2012-01-12 DIAGNOSIS — M48061 Spinal stenosis, lumbar region without neurogenic claudication: Secondary | ICD-10-CM | POA: Diagnosis not present

## 2012-01-16 DIAGNOSIS — H26499 Other secondary cataract, unspecified eye: Secondary | ICD-10-CM | POA: Diagnosis not present

## 2012-01-24 ENCOUNTER — Telehealth: Payer: Self-pay | Admitting: *Deleted

## 2012-01-24 MED ORDER — HYDROCOD POLST-CHLORPHEN POLST 10-8 MG/5ML PO LQCR: 5.0000 mL | Freq: Two times a day (BID) | ORAL | Status: DC | PRN

## 2012-01-24 MED ORDER — MOMETASONE FUROATE 50 MCG/ACT NA SUSP: NASAL | Status: DC

## 2012-01-24 NOTE — Telephone Encounter (Signed)
Tussinex could not be EScribed so the RX was called to [facility] and left on the answering machine.

## 2012-01-24 NOTE — Telephone Encounter (Signed)
Spoke with pt. And discussed why she did not get a call back on Friday as she left a message on the answering machine.  She was told that after hours she can ask the tell the answering service to page or call Dr Constance Goltz.  She is requesting a cough med with Codeine and some nose spray.  Message routed to Dr Constance Goltz.

## 2012-01-26 DIAGNOSIS — F4323 Adjustment disorder with mixed anxiety and depressed mood: Secondary | ICD-10-CM | POA: Diagnosis not present

## 2012-01-31 DIAGNOSIS — M48061 Spinal stenosis, lumbar region without neurogenic claudication: Secondary | ICD-10-CM | POA: Diagnosis not present

## 2012-01-31 DIAGNOSIS — IMO0002 Reserved for concepts with insufficient information to code with codable children: Secondary | ICD-10-CM | POA: Diagnosis not present

## 2012-02-02 DIAGNOSIS — M48061 Spinal stenosis, lumbar region without neurogenic claudication: Secondary | ICD-10-CM | POA: Diagnosis not present

## 2012-02-02 DIAGNOSIS — IMO0002 Reserved for concepts with insufficient information to code with codable children: Secondary | ICD-10-CM | POA: Diagnosis not present

## 2012-02-06 DIAGNOSIS — M545 Low back pain: Secondary | ICD-10-CM | POA: Diagnosis not present

## 2012-02-06 DIAGNOSIS — M48061 Spinal stenosis, lumbar region without neurogenic claudication: Secondary | ICD-10-CM | POA: Diagnosis not present

## 2012-02-07 ENCOUNTER — Telehealth: Payer: Self-pay | Admitting: Internal Medicine

## 2012-02-07 MED ORDER — HYDROCODONE-HOMATROPINE 5-1.5 MG/5ML PO SYRP: 5.0000 mL | ORAL_SOLUTION | Freq: Four times a day (QID) | ORAL | Status: AC | PRN

## 2012-02-07 NOTE — Telephone Encounter (Signed)
Spoke with pt.  Still having cough.  Did not get Tussionex as it was too expensive.  She does not want to try prednisone as she has had 2 recent steroid injections for her back.  She is not wheezing.  She is not using Flonase regularly'  Advised to use Flonase bid for one week, then once a day for a month.    Ok to take 2 otc  Sudafed bid.    Will call [facility]cod[facility] cough syrup to [facility].  Pt advised if not better in one week to see me in office

## 2012-02-08 ENCOUNTER — Telehealth: Payer: Self-pay | Admitting: *Deleted

## 2012-02-08 DIAGNOSIS — M545 Low back pain: Secondary | ICD-10-CM | POA: Diagnosis not present

## 2012-02-08 DIAGNOSIS — M48061 Spinal stenosis, lumbar region without neurogenic claudication: Secondary | ICD-10-CM | POA: Diagnosis not present

## 2012-02-08 NOTE — Telephone Encounter (Signed)
Spoke with [facility] and they had not received her RX for Hycodone cough syrup so I spoke with the pharmacist personally and called the script in and the cost is 21.00.  Pt very nasty to me on the phone because she had not been able to get her RX last night.  I told her it should be there now because I spoke personally with the pharmacist.  I suggested that she call the pharmacist before going to pick it up and if it is not there she can call me personally.  Pt hung up the phone on me.  Dr Constance Goltz made aware of pt's attitude towards me.

## 2012-02-10 DIAGNOSIS — M545 Low back pain: Secondary | ICD-10-CM | POA: Diagnosis not present

## 2012-02-10 DIAGNOSIS — M48061 Spinal stenosis, lumbar region without neurogenic claudication: Secondary | ICD-10-CM | POA: Diagnosis not present

## 2012-02-21 DIAGNOSIS — M48061 Spinal stenosis, lumbar region without neurogenic claudication: Secondary | ICD-10-CM | POA: Diagnosis not present

## 2012-02-21 DIAGNOSIS — IMO0002 Reserved for concepts with insufficient information to code with codable children: Secondary | ICD-10-CM | POA: Diagnosis not present

## 2012-03-05 ENCOUNTER — Other Ambulatory Visit: Payer: Self-pay | Admitting: Neurosurgery

## 2012-03-05 DIAGNOSIS — M5126 Other intervertebral disc displacement, lumbar region: Secondary | ICD-10-CM | POA: Diagnosis not present

## 2012-03-05 DIAGNOSIS — IMO0002 Reserved for concepts with insufficient information to code with codable children: Secondary | ICD-10-CM | POA: Diagnosis not present

## 2012-03-08 DIAGNOSIS — E039 Hypothyroidism, unspecified: Secondary | ICD-10-CM | POA: Diagnosis not present

## 2012-03-08 DIAGNOSIS — E785 Hyperlipidemia, unspecified: Secondary | ICD-10-CM | POA: Diagnosis not present

## 2012-03-12 DIAGNOSIS — E0500 Thyrotoxicosis with diffuse goiter without thyrotoxic crisis or storm: Secondary | ICD-10-CM | POA: Diagnosis not present

## 2012-03-12 DIAGNOSIS — E039 Hypothyroidism, unspecified: Secondary | ICD-10-CM | POA: Diagnosis not present

## 2012-03-12 DIAGNOSIS — R5382 Chronic fatigue, unspecified: Secondary | ICD-10-CM | POA: Diagnosis not present

## 2012-03-12 DIAGNOSIS — E785 Hyperlipidemia, unspecified: Secondary | ICD-10-CM | POA: Diagnosis not present

## 2012-03-19 ENCOUNTER — Encounter (HOSPITAL_COMMUNITY): Payer: Self-pay | Admitting: Respiratory Therapy

## 2012-03-23 ENCOUNTER — Inpatient Hospital Stay (HOSPITAL_COMMUNITY): Admission: RE | Admit: 2012-03-23 | Payer: Medicare Other | Source: Ambulatory Visit

## 2012-03-27 ENCOUNTER — Encounter (HOSPITAL_COMMUNITY): Admission: RE | Admit: 2012-03-27 | Payer: Medicare Other | Source: Ambulatory Visit

## 2012-03-27 DIAGNOSIS — M5126 Other intervertebral disc displacement, lumbar region: Secondary | ICD-10-CM | POA: Diagnosis not present

## 2012-03-30 ENCOUNTER — Encounter (HOSPITAL_COMMUNITY): Admission: RE | Payer: Self-pay | Source: Ambulatory Visit

## 2012-03-30 ENCOUNTER — Inpatient Hospital Stay (HOSPITAL_COMMUNITY): Admission: RE | Admit: 2012-03-30 | Payer: Medicare Other | Source: Ambulatory Visit | Admitting: Neurosurgery

## 2012-03-30 SURGERY — LUMBAR LAMINECTOMY/DECOMPRESSION MICRODISCECTOMY 1 LEVEL
Anesthesia: General | Laterality: Right

## 2012-04-05 ENCOUNTER — Encounter: Payer: Self-pay | Admitting: Internal Medicine

## 2012-04-25 ENCOUNTER — Telehealth: Payer: Self-pay | Admitting: *Deleted

## 2012-04-25 NOTE — Telephone Encounter (Signed)
Pt called stating that she has cold symptoms of mucus, scratchy throat and stuffy head and cough.  She denies any fever.  Recommend Delsym for cough, Mucinex D and saline nose drops.

## 2012-04-26 DIAGNOSIS — M899 Disorder of bone, unspecified: Secondary | ICD-10-CM | POA: Diagnosis not present

## 2012-04-26 DIAGNOSIS — M949 Disorder of cartilage, unspecified: Secondary | ICD-10-CM | POA: Diagnosis not present

## 2012-04-26 DIAGNOSIS — Z1231 Encounter for screening mammogram for malignant neoplasm of breast: Secondary | ICD-10-CM | POA: Diagnosis not present

## 2012-04-26 DIAGNOSIS — Z8262 Family history of osteoporosis: Secondary | ICD-10-CM | POA: Diagnosis not present

## 2012-04-27 ENCOUNTER — Encounter: Payer: Self-pay | Admitting: Obstetrics and Gynecology

## 2012-05-02 ENCOUNTER — Encounter: Payer: Self-pay | Admitting: Obstetrics and Gynecology

## 2012-05-07 ENCOUNTER — Telehealth: Payer: Self-pay | Admitting: Internal Medicine

## 2012-05-07 NOTE — Telephone Encounter (Signed)
Pt lvm requesting to have the doctor or nurse to give her a call... Pt number is [phone].Marland KitchenMarland Kitchen

## 2012-05-13 ENCOUNTER — Encounter: Payer: Self-pay | Admitting: Internal Medicine

## 2012-05-13 DIAGNOSIS — M810 Age-related osteoporosis without current pathological fracture: Secondary | ICD-10-CM | POA: Insufficient documentation

## 2012-05-13 DIAGNOSIS — Z23 Encounter for immunization: Secondary | ICD-10-CM | POA: Diagnosis not present

## 2012-05-15 ENCOUNTER — Telehealth: Payer: Self-pay | Admitting: Internal Medicine

## 2012-05-15 DIAGNOSIS — M810 Age-related osteoporosis without current pathological fracture: Secondary | ICD-10-CM

## 2012-05-15 NOTE — Telephone Encounter (Signed)
Spoke with pt. And informed of osteoporosis on bone density.  She also was informed by Dr.Gottsegan's office.  She does not wish to take bisphosphanates due to multiple dental issues.  Will set up referral to Dr. Corliss Skains to discuss options for treatment

## 2012-06-01 DIAGNOSIS — M545 Low back pain, unspecified: Secondary | ICD-10-CM | POA: Diagnosis not present

## 2012-06-01 DIAGNOSIS — M25559 Pain in unspecified hip: Secondary | ICD-10-CM | POA: Diagnosis not present

## 2012-06-08 DIAGNOSIS — M76899 Other specified enthesopathies of unspecified lower limb, excluding foot: Secondary | ICD-10-CM | POA: Diagnosis not present

## 2012-06-15 ENCOUNTER — Encounter: Payer: Self-pay | Admitting: *Deleted

## 2012-06-21 DIAGNOSIS — M25569 Pain in unspecified knee: Secondary | ICD-10-CM | POA: Diagnosis not present

## 2012-06-21 DIAGNOSIS — M25549 Pain in joints of unspecified hand: Secondary | ICD-10-CM | POA: Diagnosis not present

## 2012-06-21 DIAGNOSIS — M810 Age-related osteoporosis without current pathological fracture: Secondary | ICD-10-CM | POA: Diagnosis not present

## 2012-06-29 ENCOUNTER — Other Ambulatory Visit: Payer: Self-pay | Admitting: *Deleted

## 2012-06-29 MED ORDER — LEVOTHYROXINE SODIUM 112 MCG PO TABS: 112.0000 ug | ORAL_TABLET | Freq: Every day | ORAL | Status: DC

## 2012-06-29 NOTE — Telephone Encounter (Signed)
Needs refill

## 2012-07-02 DIAGNOSIS — M810 Age-related osteoporosis without current pathological fracture: Secondary | ICD-10-CM | POA: Diagnosis not present

## 2012-07-02 DIAGNOSIS — Z79899 Other long term (current) drug therapy: Secondary | ICD-10-CM | POA: Diagnosis not present

## 2012-07-02 DIAGNOSIS — R5383 Other fatigue: Secondary | ICD-10-CM | POA: Diagnosis not present

## 2012-07-02 DIAGNOSIS — M2550 Pain in unspecified joint: Secondary | ICD-10-CM | POA: Diagnosis not present

## 2012-07-20 DIAGNOSIS — M810 Age-related osteoporosis without current pathological fracture: Secondary | ICD-10-CM | POA: Diagnosis not present

## 2012-07-20 DIAGNOSIS — M5137 Other intervertebral disc degeneration, lumbosacral region: Secondary | ICD-10-CM | POA: Diagnosis not present

## 2012-07-20 DIAGNOSIS — M25549 Pain in joints of unspecified hand: Secondary | ICD-10-CM | POA: Diagnosis not present

## 2012-07-20 DIAGNOSIS — M5030 Other cervical disc degeneration, unspecified cervical region: Secondary | ICD-10-CM | POA: Diagnosis not present

## 2012-08-23 DIAGNOSIS — L439 Lichen planus, unspecified: Secondary | ICD-10-CM | POA: Diagnosis not present

## 2012-08-23 DIAGNOSIS — C44519 Basal cell carcinoma of skin of other part of trunk: Secondary | ICD-10-CM | POA: Diagnosis not present

## 2012-08-23 DIAGNOSIS — D2370 Other benign neoplasm of skin of unspecified lower limb, including hip: Secondary | ICD-10-CM | POA: Diagnosis not present

## 2012-08-23 DIAGNOSIS — D485 Neoplasm of uncertain behavior of skin: Secondary | ICD-10-CM | POA: Diagnosis not present

## 2012-08-23 DIAGNOSIS — L259 Unspecified contact dermatitis, unspecified cause: Secondary | ICD-10-CM | POA: Diagnosis not present

## 2012-08-23 DIAGNOSIS — Z85828 Personal history of other malignant neoplasm of skin: Secondary | ICD-10-CM | POA: Diagnosis not present

## 2012-08-23 DIAGNOSIS — D239 Other benign neoplasm of skin, unspecified: Secondary | ICD-10-CM | POA: Diagnosis not present

## 2012-10-08 DIAGNOSIS — S93609A Unspecified sprain of unspecified foot, initial encounter: Secondary | ICD-10-CM | POA: Diagnosis not present

## 2012-10-08 DIAGNOSIS — M67919 Unspecified disorder of synovium and tendon, unspecified shoulder: Secondary | ICD-10-CM | POA: Diagnosis not present

## 2012-10-08 DIAGNOSIS — S6390XA Sprain of unspecified part of unspecified wrist and hand, initial encounter: Secondary | ICD-10-CM | POA: Diagnosis not present

## 2012-10-08 DIAGNOSIS — S8000XA Contusion of unspecified knee, initial encounter: Secondary | ICD-10-CM | POA: Diagnosis not present

## 2012-10-08 DIAGNOSIS — M719 Bursopathy, unspecified: Secondary | ICD-10-CM | POA: Diagnosis not present

## 2012-10-26 ENCOUNTER — Other Ambulatory Visit (INDEPENDENT_AMBULATORY_CARE_PROVIDER_SITE_OTHER): Payer: Medicare Other | Admitting: *Deleted

## 2012-10-26 DIAGNOSIS — R3989 Other symptoms and signs involving the genitourinary system: Secondary | ICD-10-CM | POA: Diagnosis not present

## 2012-10-26 LAB — POCT URINALYSIS DIPSTICK
Bilirubin, UA: NEGATIVE
Leukocytes, UA: NEGATIVE
Nitrite, UA: NEGATIVE
Protein, UA: NEGATIVE
Urobilinogen, UA: NEGATIVE
pH, UA: 6

## 2012-10-26 NOTE — Progress Notes (Signed)
UA normal advised pt to follow up in office on Monday 3/3

## 2012-10-29 ENCOUNTER — Encounter: Payer: Self-pay | Admitting: Internal Medicine

## 2012-10-29 ENCOUNTER — Ambulatory Visit (INDEPENDENT_AMBULATORY_CARE_PROVIDER_SITE_OTHER): Payer: Medicare Other | Admitting: Internal Medicine

## 2012-10-29 VITALS — BP 138/86 | HR 72 | Temp 97.9°F | Resp 18 | Ht 61.0 in | Wt 164.0 lb

## 2012-10-29 DIAGNOSIS — E039 Hypothyroidism, unspecified: Secondary | ICD-10-CM

## 2012-10-29 DIAGNOSIS — M810 Age-related osteoporosis without current pathological fracture: Secondary | ICD-10-CM

## 2012-10-29 DIAGNOSIS — E785 Hyperlipidemia, unspecified: Secondary | ICD-10-CM | POA: Diagnosis not present

## 2012-10-29 DIAGNOSIS — N3941 Urge incontinence: Secondary | ICD-10-CM | POA: Insufficient documentation

## 2012-10-29 MED ORDER — OXYBUTYNIN CHLORIDE ER 5 MG PO TB24: 5.0000 mg | ORAL_TABLET | Freq: Every day | ORAL | Status: DC

## 2012-10-29 NOTE — Progress Notes (Signed)
Subjective:    Patient ID: Marilyn Rivas, female    DOB: 03/16/1941, 72 y.o.   MRN: [id number]  HPI Marilyn Rivas is here for follow up  She reports she had seen Dr. Corliss Skains and cannot afford  Forteo.  She did not like Side effects of Prolia so does not want to take this.  She is seeing an herbalist in [place] and along with herbs she takes her calcium and vitamin D  She brings labs from Dr. Fatima Sanger office.   Random glucose 140.    She also cannot tolerate statins.   She did see Dr. Leslie Dales for her thyroid and her cholesterol.    She has urge incontinence symptoms.  She does not leak with coughing or sneezing.  Feels strong urge in the morning and  No dysruria  See U/s from Friday  Heme neg  Le neg  She also has fatigue.  Sleeping fine at night  Allergies  Allergen Reactions  . Aspirin     REACTION: severe abdominal pain  . Atorvastatin Other (See Comments)    myalgia  . Adhesive (Tape) Rash  . Clarithromycin Rash  . Penicillins Rash   Past Medical History  Diagnosis Date  . Thyroid disease   . Hyperlipidemia   . Menopause   . Osteopenia    Past Surgical History  Procedure Laterality Date  . Appendectomy  1977  . Right oophorectomy  1977  . Patella reconstruction  1994    left  . Cervical fusion  1999    C5-7  . Lumbar disc surgery  2004    L5   History   Social History  . Marital Status: Single    Spouse Name: N/A    Number of Children: N/A  . Years of Education: N/A   Occupational History  . Not on file.   Social History Main Topics  . Smoking status: Former Games developer  . Smokeless tobacco: Not on file  . Alcohol Use: Not on file  . Drug Use: Not on file  . Sexually Active: No   Other Topics Concern  . Not on file   Social History Narrative  . No narrative on file   Family History  Problem Relation Age of Onset  . Allergies Mother   . Heart disease Father    Patient Active Problem List  Diagnosis  . HYPERLIPIDEMIA-MIXED   . CONSTIPATION  . PERSONAL HX COLONIC POLYPS  . GERD  . DIVERTICULITIS, COLON  . Osteopenia  . Hypothyroidism  . DJD (degenerative joint disease)  . CIN I (cervical intraepithelial neoplasia I)  . Atrophic vaginitis  . Radiculopathy of lumbar region  . Sinus bradycardia by electrocardiogram  . Basal cell cancer  . Osteoporosis  . Hyperglycemia   Current Outpatient Prescriptions on File Prior to Visit  Medication Sig Dispense Refill  . Calcium Carbonate-Vit D-Min (CALCIUM 1200 PO) Take 2,400 mg by mouth daily.      . Cholecalciferol (VITAMIN D3) 5000 UNITS TABS Take 1 tablet by mouth daily.      Marland Kitchen levothyroxine (SYNTHROID, LEVOTHROID) 112 MCG tablet Take 1 tablet (112 mcg total) by mouth daily.  90 tablet  1  . Nutritional Supplements (NUTRITIONAL SUPPLEMENT PO) Take 1 tablet by mouth daily. Bio-tears      . traMADol (ULTRAM) 50 MG tablet Take 50 mg by mouth every 6 (six) hours as needed. For pain      . fexofenadine (ALLEGRA) 180 MG tablet Take 180 mg by mouth daily.      Marland Kitchen  OMEGA 3 1000 MG CAPS Take 2 capsules by mouth 2 (two) times daily.        No current facility-administered medications on file prior to visit.       Review of Systems See HPI    Objective:   Physical Exam Physical Exam  Nursing note and vitals reviewed.   Repeat  BP is normal Constitutional: She is oriented to person, place, and time. She appears well-developed and well-nourished.  HENT:  Head: Normocephalic and atraumatic.  Cardiovascular: Normal rate and regular rhythm. Exam reveals no gallop and no friction rub.  No murmur heard.  Pulmonary/Chest: Breath sounds normal. She has no wheezes. She has no rales.  Neurological: She is alert and oriented to person, place, and time.  Skin: Skin is warm and dry.  Psychiatric: She has a normal mood and affect. Her behavior is normal.  Ext no edema      Assessment & Plan:  Hyperlipidemia  Will recheck when fasting.  She is aware of risk of MI, CVA but  repeatedly declines treatment for this  Hypothyroidism  Will check today  Elevated glucose  Random glucose in office 93  Urge incontinence  OK to try ER ditropan 5 mg .   Counseled SE of dry mouth,  Constipation and possible blurred vision. She does not have glaucoma  U/A neg  Fatigue  Check all labs today

## 2012-11-05 DIAGNOSIS — R7309 Other abnormal glucose: Secondary | ICD-10-CM | POA: Diagnosis not present

## 2012-11-05 DIAGNOSIS — M810 Age-related osteoporosis without current pathological fracture: Secondary | ICD-10-CM | POA: Diagnosis not present

## 2012-11-05 LAB — CBC WITH DIFFERENTIAL/PLATELET
Basophils Relative: 1 % (ref 0–1)
Eosinophils Absolute: 0.4 10*3/uL (ref 0.0–0.7)
Eosinophils Relative: 5 % (ref 0–5)
MCH: 31.4 pg (ref 26.0–34.0)
MCHC: 35.1 g/dL (ref 30.0–36.0)
MCV: 89.5 fL (ref 78.0–100.0)
Monocytes Relative: 6 % (ref 3–12)
Neutrophils Relative %: 52 % (ref 43–77)
Platelets: 243 10*3/uL (ref 150–400)

## 2012-11-05 LAB — HEMOGLOBIN A1C
Hgb A1c MFr Bld: 5.8 % — ABNORMAL HIGH (ref <5.7)
Mean Plasma Glucose: 120 mg/dL — ABNORMAL HIGH (ref <117)

## 2012-11-06 LAB — COMPREHENSIVE METABOLIC PANEL
AST: 21 U/L (ref 0–37)
Albumin: 4.5 g/dL (ref 3.5–5.2)
Alkaline Phosphatase: 89 U/L (ref 39–117)
Potassium: 4.1 mEq/L (ref 3.5–5.3)
Sodium: 140 mEq/L (ref 135–145)
Total Protein: 6.6 g/dL (ref 6.0–8.3)

## 2012-11-06 LAB — TSH: TSH: 1.494 u[IU]/mL (ref 0.350–4.500)

## 2012-11-13 DIAGNOSIS — C44519 Basal cell carcinoma of skin of other part of trunk: Secondary | ICD-10-CM | POA: Diagnosis not present

## 2012-11-21 DIAGNOSIS — L0889 Other specified local infections of the skin and subcutaneous tissue: Secondary | ICD-10-CM | POA: Diagnosis not present

## 2013-02-13 ENCOUNTER — Ambulatory Visit: Payer: Medicare Other | Admitting: Internal Medicine

## 2013-02-13 ENCOUNTER — Ambulatory Visit (INDEPENDENT_AMBULATORY_CARE_PROVIDER_SITE_OTHER): Payer: Medicare Other | Admitting: Physician Assistant

## 2013-02-13 ENCOUNTER — Encounter: Payer: Self-pay | Admitting: Physician Assistant

## 2013-02-13 ENCOUNTER — Other Ambulatory Visit (INDEPENDENT_AMBULATORY_CARE_PROVIDER_SITE_OTHER): Payer: Medicare Other

## 2013-02-13 VITALS — BP 132/68 | HR 86 | Ht 61.0 in | Wt 165.0 lb

## 2013-02-13 DIAGNOSIS — R1310 Dysphagia, unspecified: Secondary | ICD-10-CM | POA: Diagnosis not present

## 2013-02-13 DIAGNOSIS — R142 Eructation: Secondary | ICD-10-CM

## 2013-02-13 DIAGNOSIS — R197 Diarrhea, unspecified: Secondary | ICD-10-CM

## 2013-02-13 DIAGNOSIS — R1013 Epigastric pain: Secondary | ICD-10-CM

## 2013-02-13 DIAGNOSIS — R112 Nausea with vomiting, unspecified: Secondary | ICD-10-CM | POA: Diagnosis not present

## 2013-02-13 DIAGNOSIS — R140 Abdominal distension (gaseous): Secondary | ICD-10-CM

## 2013-02-13 DIAGNOSIS — R141 Gas pain: Secondary | ICD-10-CM

## 2013-02-13 LAB — LIPASE: Lipase: 27 U/L (ref 11.0–59.0)

## 2013-02-13 LAB — CBC WITH DIFFERENTIAL/PLATELET
Basophils Absolute: 0.1 10*3/uL (ref 0.0–0.1)
Eosinophils Absolute: 0.3 10*3/uL (ref 0.0–0.7)
Eosinophils Relative: 4.5 % (ref 0.0–5.0)
Lymphs Abs: 2.6 10*3/uL (ref 0.7–4.0)
MCV: 93.8 fl (ref 78.0–100.0)
Monocytes Absolute: 0.8 10*3/uL (ref 0.1–1.0)
Neutrophils Relative %: 50.6 % (ref 43.0–77.0)
Platelets: 297 10*3/uL (ref 150.0–400.0)
RDW: 12.5 % (ref 11.5–14.6)
WBC: 7.7 10*3/uL (ref 4.5–10.5)

## 2013-02-13 LAB — COMPREHENSIVE METABOLIC PANEL
BUN: 12 mg/dL (ref 6–23)
CO2: 26 mEq/L (ref 19–32)
Calcium: 9.1 mg/dL (ref 8.4–10.5)
Creatinine, Ser: 0.7 mg/dL (ref 0.4–1.2)
GFR: 87.24 mL/min (ref >60.00)
Potassium: 3.8 mEq/L (ref 3.5–5.1)
Sodium: 136 mEq/L (ref 135–145)

## 2013-02-13 MED ORDER — HYOSCYAMINE SULFATE 0.125 MG SL SUBL: 0.1250 mg | SUBLINGUAL_TABLET | SUBLINGUAL | Status: DC | PRN

## 2013-02-13 NOTE — Patient Instructions (Addendum)
Please go to the basement level to have your labs drawn.  We sent a prescription to [facility] [address] for an antispasmodic, Levsin.  We have given you samples of Nexium 40 mg. Take 1 capsule 30 min before breakfast. You have been scheduled for an endoscopy with propofol. Please follow written instructions given to you at your visit today. If you use inhalers (even only as needed), please bring them with you on the day of your procedure.

## 2013-02-13 NOTE — Progress Notes (Signed)
Subjective:    Patient ID: Marilyn Rivas, female    DOB: September 13, 1940, 72 y.o.   MRN: [id number]  HPI  Marilyn Rivas  is a 72 year old white female known to Dr. Marina Goodell who has history of GERD, adenomatous polyps and severe diverticular disease. She has remote history of peritonitis for which she underwent an appendectomy and removal of one fallopian tube. She last had colonoscopy February of 2012 with severe pan diverticular disease. She had remote EGD with Dr. Virginia Rochester in 2003 which was unremarkable  . Comes in today stating that she's been having problems over the past couple of months with intermittent solid food dysphagia. She says she feels that if her that her food sets in her chest for prolonged periods of time and then eventually goes down. She's not had any episodes of regurgitation. She also feels that she's not digesting her food very well. She's having intermittent nausea ,abdominal bloating ,and occasional vomiting. The nausea seems to be more prominent in the afternoons or early evenings. Her appetite has been fair, her weight has been stable. She has noticed a new intolerance to any greasy foods. She also mentions intermittent episodes of urgency followed by diarrheal stools. She says sometimes she gets no warning and will be incontinent.. Once one of these episodes start she will have multiple episodes of diarrhea and then be fine for a week or 2 until she has another episode. She does have normal bowel movements in between no melena or hematochezia. She has not been on any new medications, no regular aspirin or NSAIDs and no antibiotics.    Review of Systems  Constitutional: Negative.   HENT: Positive for trouble swallowing.   Respiratory: Negative.   Cardiovascular: Negative.   Gastrointestinal: Positive for nausea, abdominal pain, diarrhea and abdominal distention.  Endocrine: Negative.   Genitourinary: Negative.   Musculoskeletal: Positive for back pain.  Skin: Negative.    Allergic/Immunologic: Negative.   Neurological: Negative.   Hematological: Negative.   Psychiatric/Behavioral: Negative.    Outpatient Prescriptions Prior to Visit  Medication Sig Dispense Refill  . Calcium Carbonate-Vit D-Min (CALCIUM 1200 PO) Take 2,400 mg by mouth daily.      . Cholecalciferol (VITAMIN D3) 5000 UNITS TABS Take 1 tablet by mouth daily.      Marland Kitchen levothyroxine (SYNTHROID, LEVOTHROID) 112 MCG tablet Take 1 tablet (112 mcg total) by mouth daily.  90 tablet  1  . Nutritional Supplements (NUTRITIONAL SUPPLEMENT PO) Take 1 tablet by mouth daily. Bio-tears      . OMEGA 3 1000 MG CAPS Take 2 capsules by mouth 2 (two) times daily.       . fexofenadine (ALLEGRA) 180 MG tablet Take 180 mg by mouth daily.      Marland Kitchen oxybutynin (DITROPAN-XL) 5 MG 24 hr tablet Take 1 tablet (5 mg total) by mouth daily.  30 tablet  1  . traMADol (ULTRAM) 50 MG tablet Take 50 mg by mouth every 6 (six) hours as needed. For pain       No facility-administered medications prior to visit.   Allergies  Allergen Reactions  . Aspirin     REACTION: severe abdominal pain  . Atorvastatin Other (See Comments)    myalgia  . Adhesive (Tape) Rash  . Clarithromycin Rash  . Penicillins Rash   Patient Active Problem List   Diagnosis Date Noted  . Hyperglycemia 10/29/2012  . Urge incontinence of urine 10/29/2012  . Osteoporosis 05/13/2012  . Basal cell cancer 12/17/2011  . Sinus bradycardia by electrocardiogram  11/15/2011  . Osteopenia 09/07/2011  . Hypothyroidism 09/07/2011  . DJD (degenerative joint disease) 09/07/2011  . CIN I (cervical intraepithelial neoplasia I) 09/07/2011  . Atrophic vaginitis 09/07/2011  . Radiculopathy of lumbar region 09/07/2011  . GERD 09/08/2010  . DIVERTICULITIS, COLON 09/08/2010  . CONSTIPATION 10/09/2009  . PERSONAL HX COLONIC POLYPS 10/09/2009  . HYPERLIPIDEMIA-MIXED 04/10/2009   History   Social History Narrative   Daily caffeine       family history includes  Allergies in her mother and Heart disease in her father.  There is no history of Colon cancer.  Objective:   Physical Exam well-developed older white female in no acute distress, pleasant accompanied by her daughter blood pressure 132/68 pulse 86 height 5 foot 1 weight 165. HEENT; nontraumatic normocephalic EOMI PERRLA sclera anicteric, Cardiovascular; regular rate and rhythm with S1-S2 no murmur or gallop, Pulmonary; clear bilaterally, Abdomen; soft bowel sounds are active there is no succussion splash no palpable mass or hepatosplenomegaly she is tender in the right mid abdomen and periumbilical area there is no guarding or rebound, Rectal; exam Brown Hemoccult negative stool, Extremities ;no clubbing cyanosis or edema skin warm and dry, Psych; mood and affect normal and appropriate.        Assessment & Plan:  #25 72 year old female with 2 month history of somewhat progressive GI symptoms including intermittent solid food dysphagia, nausea intermittent vomiting abdominal bloating and sporadic episodes of urgency and diarrhea. Etiology of is not clear and she may have multifactorial issues. I suspect she does have an esophageal stricture and will also need to rule out gastropathy, peptic ulcer disease and gallbladder disease #2 Pan diverticular disease #3 status post remote appendectomy and removed rectum he #4 history of adenomatous polyps-due for followup colonoscopy February 2017  Plan; check CBC with differential, CMET,and lipase Trial of Levsin sublingual when necessary for episodes of urgency and diarrhea Patient was given samples of Nexium 40 mg by mouth every morning Schedule for upper endoscopy with Dr. Marina Goodell with possible esophageal dilation-cedar was discussed in detail with the patient and her daughter and they're agreeable to proceed. We also discussed CT scan of the abdomen and pelvis-patient wishes to proceed with upper endoscopy first however if this is unrevealing she will need  CT of the abdomen and pelvis

## 2013-02-13 NOTE — Progress Notes (Signed)
Reviewed. Agree with initial assessment and plans 

## 2013-02-26 ENCOUNTER — Ambulatory Visit (AMBULATORY_SURGERY_CENTER): Payer: Medicare Other | Admitting: Internal Medicine

## 2013-02-26 ENCOUNTER — Encounter: Payer: Self-pay | Admitting: Internal Medicine

## 2013-02-26 ENCOUNTER — Other Ambulatory Visit: Payer: Self-pay | Admitting: Internal Medicine

## 2013-02-26 VITALS — BP 144/76 | HR 57 | Temp 98.0°F | Resp 16 | Ht 61.0 in | Wt 165.0 lb

## 2013-02-26 DIAGNOSIS — K222 Esophageal obstruction: Secondary | ICD-10-CM | POA: Diagnosis not present

## 2013-02-26 DIAGNOSIS — E039 Hypothyroidism, unspecified: Secondary | ICD-10-CM | POA: Diagnosis not present

## 2013-02-26 DIAGNOSIS — R109 Unspecified abdominal pain: Secondary | ICD-10-CM

## 2013-02-26 DIAGNOSIS — R1310 Dysphagia, unspecified: Secondary | ICD-10-CM

## 2013-02-26 MED ORDER — SODIUM CHLORIDE 0.9 % IV SOLN: 500.0000 mL | INTRAVENOUS | Status: DC

## 2013-02-26 NOTE — Op Note (Signed)
[facility] [address] [place], [address]   ENDOSCOPY PROCEDURE REPORT  PATIENT: Marilyn, Rivas  MR#: [id number] BIRTHDATE: [birth year] , 71  yrs. old GENDER: Female ENDOSCOPIST: Roxy Cedar, MD REFERRED BY:  .  Self / Office PROCEDURE DATE:  02/26/2013 PROCEDURE:  EGD, diagnostic and Maloney dilation of esophagus  - 11 F ASA CLASS:     Class II INDICATIONS:  Dysphagia. MEDICATIONS: MAC sedation, administered by CRNA and propofol (Diprivan) 100mg  IV TOPICAL ANESTHETIC: none  DESCRIPTION OF PROCEDURE: After the risks benefits and alternatives of the procedure were thoroughly explained, informed consent was obtained.  The LB [id number] endoscope was introduced through the mouth and advanced to the second portion of the duodenum. Without limitations.  The instrument was slowly withdrawn as the mucosa was fully examined.    EXAM:The upper, middle and distal third of the esophagus were carefully inspected and no abnormalities were noted.  The z-line was well seen at the GEJ.  The endoscope was pushed into the fundus which was normal including a retroflexed view.  The antrum, gastric body, first and second part of the duodenum were unremarkable. Retroflexed views revealed no abnormalities.     The scope was then withdrawn from the patient and the procedure completed.  Therapy: 61F Maloney dilator passed w/o resistance or heme. Tolerated well  COMPLICATIONS: There were no complications.  ENDOSCOPIC IMPRESSION: 1. Normal EGD 2. S/P dilation for dysphagia  RECOMMENDATIONS: 1. My office will arrange for you to have a Solid phase Gastric Emptying Scan performed "r/o gastroparesis".  This is a radiology test that gives an idea of how well your stomach empties.  REPEAT EXAM:  eSigned:  Roxy Cedar, MD 02/26/2013 9:32 AM   [id number] Constance Goltz, MD and The Patient

## 2013-02-26 NOTE — Progress Notes (Signed)
Called to room to assist during endoscopic procedure.  Patient ID and intended procedure confirmed with present staff. Received instructions for my participation in the procedure from the performing physician.  

## 2013-02-26 NOTE — Progress Notes (Signed)
Patient did not experience any of the following events: a burn prior to discharge; a fall within the facility; wrong site/side/patient/procedure/implant event; or a hospital transfer or hospital admission upon discharge from the facility. (G8907) Patient did not have preoperative order for IV antibiotic SSI prophylaxis. (G8918)  

## 2013-02-26 NOTE — Patient Instructions (Signed)
YOU HAD AN ENDOSCOPIC PROCEDURE TODAY AT THE [facility]: Refer to the procedure report that was given to you for any specific questions about what was found during the examination.  If the procedure report does not answer your questions, please call your gastroenterologist to clarify.  If you requested that your care partner not be given the details of your procedure findings, then the procedure report has been included in a sealed envelope for you to review at your convenience later.  YOU SHOULD EXPECT: Some feelings of bloating in the abdomen. Passage of more gas than usual.  Walking can help get rid of the air that was put into your GI tract during the procedure and reduce the bloating. If you had a lower endoscopy (such as a colonoscopy or flexible sigmoidoscopy) you may notice spotting of blood in your stool or on the toilet paper. If you underwent a bowel prep for your procedure, then you may not have a normal bowel movement for a few days.  DIET:   Nothing to eat or drink until 10:30. 10:30 until 11:30 only clear liquids. 11:30 until morning only soft foods. Resume your regular diet in AM.   ACTIVITY: Your care partner should take you home directly after the procedure.  You should plan to take it easy, moving slowly for the rest of the day.  You can resume normal activity the day after the procedure however you should NOT DRIVE or use heavy machinery for 24 hours (because of the sedation medicines used during the test).    SYMPTOMS TO REPORT IMMEDIATELY: A gastroenterologist can be reached at any hour.  During normal business hours, 8:30 AM to 5:00 PM Monday through Friday, call [phone].  After hours and on weekends, please call the GI answering service at [phone] who will take a message and have the physician on call contact you.  Following upper endoscopy (EGD)  Vomiting of blood or coffee ground material  New chest pain or pain under the shoulder  blades  Painful or persistently difficult swallowing  New shortness of breath  Fever of 100F or higher  Black, tarry-looking stools  FOLLOW UP: If any biopsies were taken you will be contacted by phone or by letter within the next 1-3 weeks.  Call your gastroenterologist if you have not heard about the biopsies in 3 weeks.  Our staff will call the home number listed on your records the next business day following your procedure to check on you and address any questions or concerns that you may have at that time regarding the information given to you following your procedure. This is a courtesy call and so if there is no answer at the home number and we have not heard from you through the emergency physician on call, we will assume that you have returned to your regular daily activities without incident.  SIGNATURES/CONFIDENTIALITY: You and/or your care partner have signed paperwork which will be entered into your electronic medical record.  These signatures attest to the fact that that the information above on your After Visit Summary has been reviewed and is understood.  Full responsibility of the confidentiality of this discharge information lies with you and/or your care-partner.

## 2013-02-27 ENCOUNTER — Telehealth: Payer: Self-pay

## 2013-02-27 NOTE — Telephone Encounter (Signed)
Pt scheduled for GES at [facility] 03/11/13. Pt to arrive at 8:45am for a 9am appt. Pt to be NPO after midnight. Pt aware of appt date and time.

## 2013-02-27 NOTE — Telephone Encounter (Signed)
Left a message at [phone] for the pt to call if any questions or concerns. Maw

## 2013-03-04 ENCOUNTER — Encounter: Payer: Self-pay | Admitting: Internal Medicine

## 2013-03-04 ENCOUNTER — Telehealth: Payer: Self-pay | Admitting: Internal Medicine

## 2013-03-05 ENCOUNTER — Encounter: Payer: Self-pay | Admitting: Nurse Practitioner

## 2013-03-05 ENCOUNTER — Ambulatory Visit (INDEPENDENT_AMBULATORY_CARE_PROVIDER_SITE_OTHER): Payer: Medicare Other | Admitting: Nurse Practitioner

## 2013-03-05 VITALS — BP 124/78 | HR 76 | Ht 61.0 in | Wt 164.2 lb

## 2013-03-05 DIAGNOSIS — R1032 Left lower quadrant pain: Secondary | ICD-10-CM | POA: Diagnosis not present

## 2013-03-05 DIAGNOSIS — R110 Nausea: Secondary | ICD-10-CM | POA: Diagnosis not present

## 2013-03-05 MED ORDER — METRONIDAZOLE 250 MG PO TABS: 250.0000 mg | ORAL_TABLET | Freq: Three times a day (TID) | ORAL | Status: DC

## 2013-03-05 MED ORDER — CIPROFLOXACIN HCL 250 MG PO TABS: 250.0000 mg | ORAL_TABLET | Freq: Two times a day (BID) | ORAL | Status: DC

## 2013-03-05 NOTE — Progress Notes (Signed)
  History of Present Illness:  Patient is a 72 year old female known to Dr. Marina Goodell She has a history of GERD, severe diverticular disease and adenomatous colon polyps. She was last seen a few weeks ago after presenting with intermittent solid food dysphasia, nausea, ,bloating and intermittent episodes of diarrhea with urgency. EGD was done for further evaluation. Exam was normal, esophagus was dilated empirically. She is scheduled for gastric emptying study 03/11/13.   Patient worked in today for evaluation of left sided abdominal pain. The pain was present prior to EGD on 02/26/13 but is getting worse. It is almost constant, not related to activity, not necessarily worse with meals. She reports postprandial nausea and bloating as well as abdominal gurgling noises. Patient gives a history of recurrent diverticulitis. She isn't sure whether this is diverticulitis type pain. Her BMs are at baseline. No fevers. No urinary symptoms.   Current Medications, Allergies, Past Medical History, Past Surgical History, Family History and Social History were reviewed in [employer] record.  Physical Exam: General: Well developed , white female in no acute distress Head: Normocephalic and atraumatic Eyes:  sclerae anicteric, conjunctiva pink  Ears: Normal auditory acuity Lungs: Clear throughout to auscultation Heart: Regular rate and rhythm Abdomen: Soft, non distended, mild-moderate LLQ tenderness. No masses, no hepatomegaly. Normal bowel sounds Musculoskeletal: Symmetrical with no gross deformities  Extremities: No edema  Neurological: Alert oriented x 4, grossly nonfocal Psychological:  Alert and cooperative. Normal mood and affect  Assessment and Recommendations: 1. Postprandial nausea, bloating, "gurgling" noises.  EGD unrevealing. Doubt obstructive process. Rule out delayed gastric emptying. she is already scheduled for a GES on 7/14. Symptoms tolerable at this point, she would rather  wait on adding any anti-emetics to her regimen. Further recommendations pending GES results.   2. Left mid / LLQ abdominal pain. She is tender on exam. Patient gives a history of recurrent diverticulitis. The pain has been present for several weeks, it is getting worse. Will treat empirically with Cipro and Flagyl. If no improvement, she may need CT scan.

## 2013-03-05 NOTE — Patient Instructions (Addendum)
Prescriptions will be sent to your pharmacy. [facility].  You will be started on Cipro 250mg , twice daily for 10 days and Flagyl 250, 3 times a day for 10 days.  We will call you with the results of the gastric emptying study.  You may use Ultram at home every 6 hours as needed for pain.

## 2013-03-05 NOTE — Telephone Encounter (Signed)
Pt states she is having abdominal pain on her left side below her belly button. Pt thinks she may be having a diverticulitis flare. States she is having problems sleeping also and she doesn't know what to do. Pt scheduled to see Willette Cluster NP today at 10am. Pt aware of appt date and time.

## 2013-03-06 DIAGNOSIS — R110 Nausea: Secondary | ICD-10-CM | POA: Insufficient documentation

## 2013-03-06 NOTE — Progress Notes (Signed)
Agree with initial assessment and plans as outlined 

## 2013-03-07 ENCOUNTER — Telehealth: Payer: Self-pay | Admitting: Nurse Practitioner

## 2013-03-07 DIAGNOSIS — R109 Unspecified abdominal pain: Secondary | ICD-10-CM

## 2013-03-07 NOTE — Telephone Encounter (Signed)
Spoke with patient and she states she is taking the Cipro and Flagyl that Willette Cluster, NP gave her but she is not any better. Continues to have nausea and pain. She is scheduled for GES on 03/11/13 but she is wondering if she should have a CT.(mentioned she may need this in dictation) Please, advise.

## 2013-03-08 ENCOUNTER — Telehealth: Payer: Self-pay | Admitting: *Deleted

## 2013-03-08 ENCOUNTER — Ambulatory Visit (INDEPENDENT_AMBULATORY_CARE_PROVIDER_SITE_OTHER)
Admission: RE | Admit: 2013-03-08 | Discharge: 2013-03-08 | Disposition: A | Discharge status: Home / Self Care | Payer: Medicare Other | Source: Ambulatory Visit | Attending: Gastroenterology | Admitting: Gastroenterology

## 2013-03-08 DIAGNOSIS — I700 Atherosclerosis of aorta: Secondary | ICD-10-CM | POA: Diagnosis not present

## 2013-03-08 DIAGNOSIS — R109 Unspecified abdominal pain: Secondary | ICD-10-CM

## 2013-03-08 MED ORDER — IOHEXOL 300 MG/ML  SOLN
100.0000 mL | Freq: Once | INTRAMUSCULAR | Status: AC | PRN
  Administered 2013-03-08: 100 mL via INTRAVENOUS

## 2013-03-08 NOTE — Telephone Encounter (Signed)
Per Willette Cluster, NP call patient with CT results. She should continue antibiotics and have GES. Ask if she has hx of uterine fibroids. Spoke with patient and she states she does have a history of fibroids. She does not see GYN currently. She sees Dr. Danella Penton. Faxed a copy of CT to her via EPIC. Left a message for her nurse that report has been sent.

## 2013-03-08 NOTE — Telephone Encounter (Signed)
Scheduled CT today at 1:00 PM at [facility]). NPO 4 hours prior. Drink Contrast 2 and 1 hour prior. Patient aware. Contrast up front for pick up.

## 2013-03-08 NOTE — Telephone Encounter (Signed)
Would order CT of the abdomen and pelvis.  Thank you,  Jess

## 2013-03-11 ENCOUNTER — Encounter (HOSPITAL_COMMUNITY)
Admission: RE | Admit: 2013-03-11 | Discharge: 2013-03-11 | Disposition: A | Discharge status: Home / Self Care | Payer: Medicare Other | Source: Ambulatory Visit | Attending: Internal Medicine | Admitting: Internal Medicine

## 2013-03-11 DIAGNOSIS — R109 Unspecified abdominal pain: Secondary | ICD-10-CM | POA: Insufficient documentation

## 2013-03-11 MED ORDER — TECHNETIUM TC 99M SULFUR COLLOID
2.1000 | Freq: Once | INTRAVENOUS | Status: AC | PRN
  Administered 2013-03-11: 2.1 via INTRAVENOUS

## 2013-03-13 ENCOUNTER — Encounter: Payer: Self-pay | Admitting: Obstetrics and Gynecology

## 2013-03-13 ENCOUNTER — Ambulatory Visit (INDEPENDENT_AMBULATORY_CARE_PROVIDER_SITE_OTHER): Payer: Medicare Other | Admitting: Gynecology

## 2013-03-13 ENCOUNTER — Encounter: Payer: Self-pay | Admitting: Gynecology

## 2013-03-13 DIAGNOSIS — R1032 Left lower quadrant pain: Secondary | ICD-10-CM | POA: Diagnosis not present

## 2013-03-13 DIAGNOSIS — D251 Intramural leiomyoma of uterus: Secondary | ICD-10-CM

## 2013-03-13 NOTE — Patient Instructions (Signed)
Follow up for ultrasound as scheduled 

## 2013-03-13 NOTE — Progress Notes (Signed)
Patient presents due to history of left lower quadrant pain. She was extensively evaluated and worked up by her gastroenterologist and is on a course of antibiotics given her history of diverticulitis in the past. She had a recent CT scan that showed a 3.5 cm left uterine probable leiomyoma and was referred to gynecology due to that. The remainder of the CT scan was normal to include a normal left ovary, no abdominal fluid or adenopathy. In review of her records she is known to have leiomyoma with several small myomas described in 2004 noting a 3.3 x 3.0 x 3.2 left leiomyoma. Patient describes a nagging left lower quadrant pain that radiates into her back. Seems to exacerbate when getting up from a lying to standing position. No urinary symptoms such as frequency dysuria urgency. Having some diarrhea and currently on a low fiber diet with her oral antibiotics.  Exam was Berenice Bouton Abdomen soft active bowel sounds without rebound guarding masses or organomegaly. Mild left lower quadrant tender to deep palpation. Pelvic external BUS vagina with atrophic changes. Cervix atrophic. Uterus difficult to palpate but grossly normal in size. Adnexa without gross masses or tenderness. Rectovaginal exam is normal.  Assessment and plan: Left lower quadrant pain, suspect GI etiology. Will check urinalysis. Area described on CT scan most likely persistent small myoma which I do not think accounts for her pain. Ovary was described as being normal on CT with no evidence of ascites or lymphadenopathy. Options to stop year from a gynecologic standpoint versus ultrasound for completeness discussed and she would prefer to proceed with the ultrasound which we'll go ahead and arrange.   She did ask me about Pap smear recommendations. 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 Had been receiving them routinely to 2010 which are all normal. I reviewed  current screening guidelines. As she is 40 years out treatment with normal Pap smears I think stop screening would be appropriate. She understands it does not guarantee that she would not develop an abnormality but that it. would be highly unlikely.

## 2013-03-14 ENCOUNTER — Telehealth: Payer: Self-pay | Admitting: *Deleted

## 2013-03-14 LAB — URINALYSIS W MICROSCOPIC + REFLEX CULTURE
Bacteria, UA: NONE SEEN
Bilirubin Urine: NEGATIVE
Glucose, UA: NEGATIVE mg/dL
Hgb urine dipstick: NEGATIVE
Ketones, ur: NEGATIVE mg/dL
Protein, ur: NEGATIVE mg/dL
Squamous Epithelial / LPF: NONE SEEN
Urobilinogen, UA: 0.2 mg/dL (ref 0.0–1.0)

## 2013-03-14 NOTE — Telephone Encounter (Signed)
Spoke with some better but still has some pain from side to back. She saw her GYN and is having an ultrasound next week.

## 2013-03-14 NOTE — Telephone Encounter (Signed)
Message copied by Daphine Deutscher on Thu Mar 14, 2013  3:13 PM ------      Message from: Daphine Deutscher      Created: Mon Mar 11, 2013  1:45 PM       Call and see how she is doing for PG ------

## 2013-03-20 ENCOUNTER — Other Ambulatory Visit: Payer: Self-pay | Admitting: Gynecology

## 2013-03-20 ENCOUNTER — Encounter: Payer: Self-pay | Admitting: Gynecology

## 2013-03-20 ENCOUNTER — Telehealth: Payer: Self-pay | Admitting: Internal Medicine

## 2013-03-20 ENCOUNTER — Ambulatory Visit (INDEPENDENT_AMBULATORY_CARE_PROVIDER_SITE_OTHER): Payer: Medicare Other

## 2013-03-20 ENCOUNTER — Ambulatory Visit (INDEPENDENT_AMBULATORY_CARE_PROVIDER_SITE_OTHER): Payer: Medicare Other | Admitting: Gynecology

## 2013-03-20 ENCOUNTER — Encounter: Payer: Self-pay | Admitting: Internal Medicine

## 2013-03-20 DIAGNOSIS — D252 Subserosal leiomyoma of uterus: Secondary | ICD-10-CM | POA: Diagnosis not present

## 2013-03-20 DIAGNOSIS — D259 Leiomyoma of uterus, unspecified: Secondary | ICD-10-CM

## 2013-03-20 DIAGNOSIS — R1032 Left lower quadrant pain: Secondary | ICD-10-CM | POA: Diagnosis not present

## 2013-03-20 DIAGNOSIS — D251 Intramural leiomyoma of uterus: Secondary | ICD-10-CM | POA: Diagnosis not present

## 2013-03-20 DIAGNOSIS — N83339 Acquired atrophy of ovary and fallopian tube, unspecified side: Secondary | ICD-10-CM

## 2013-03-20 DIAGNOSIS — R9389 Abnormal findings on diagnostic imaging of other specified body structures: Secondary | ICD-10-CM

## 2013-03-20 DIAGNOSIS — N8500 Endometrial hyperplasia, unspecified: Secondary | ICD-10-CM

## 2013-03-20 NOTE — Patient Instructions (Signed)
Follow up for ultrasound as scheduled 

## 2013-03-20 NOTE — Telephone Encounter (Signed)
I am not certain why she is having pain. She has had an extensive workup, without any GI abnormalities found. It is possible that she could have some benign spasm related to her diverticulosis (however she clearly does not have diverticulitis). If she would like, we can prescribed Levsin sublingual 0.125 mg, 1-2 every 4-6 hours when necessary pain. If she desires a followup GI appointment, I would be happy to see her in 4 weeks, when I'm back in the office with availability.

## 2013-03-20 NOTE — Progress Notes (Signed)
Patient follows up for ultrasound due to left lower quadrant pain and recent CT scan showing probable left intramural myoma. CT scan otherwise normal showing absent right ovary, normal/atrophic left ovary. No evidence of adenopathy.  Ultrasound shows uterus normal size with multiple small myomas. Left subserosal 36 mm myoma consistent with CT finding. 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 Left ovary atrophic. Cul-de-sac negative. Endometrial echo is thickened at 9.8 mm with some microcystic changes suggestive of polyp. It is avascular.  Assessment and plan: Left abdominal pain, unclear etiology. Do not feel it is GYN related. Thickened endometrium with questionable endometrial polyp. No history of bleeding historically. Recommended proceeding with sonohysterogram to better define endometrial cavity, allow for endometrial sampling. Possibilities to include endometrial polyp versus hyperplasia discussed. Possible need for hysteroscopy D&C also reviewed. Patient will followup for ultrasound and mammogram from there. I did review with her that I do not think GYN etiology for her pain. To consider referral to Medical Center or second opinion for further evaluation as apparently the gastroenterologist has told her that nothing further could be done from his standpoint.

## 2013-03-20 NOTE — Telephone Encounter (Signed)
Spoke with pt and she did not want the Levsin called in. States she does not need medication for the pain she needs to find what is causing the pain. Pt states she does not want to wait 4 weeks to see Dr. Marina Goodell. Pt requests that Dr. Marina Goodell refer her to a tertiary care center. Pt states she will call back and let us know where she would like to be sent.

## 2013-03-20 NOTE — Telephone Encounter (Signed)
Pt states she is still having the abdominal pain she was when she came in to be seen. Pt states she has had a EGD done 02/26/13 and a lot of other xrays/exams and she is still having the pain. Pt states she was placed on Cipro and Flagyl but the pain never got any better. Pt has seen her GYN and he has not found anything GYN related to cause the pain. Pt saw Amy Esterwood PA 02/13/13 and Willette Cluster NP 03/05/13. Offered to bring pt in to see one of the midlevels and she states she has already seen both of them and she still has the pain. Let pt know that Dr. Marina Goodell will not be in the office. Pt wanted to see what Dr. Marina Goodell would recommend. Please advise.

## 2013-03-21 ENCOUNTER — Ambulatory Visit: Payer: Self-pay | Admitting: Gynecology

## 2013-04-04 ENCOUNTER — Ambulatory Visit (INDEPENDENT_AMBULATORY_CARE_PROVIDER_SITE_OTHER): Payer: Medicare Other | Admitting: Gynecology

## 2013-04-04 ENCOUNTER — Ambulatory Visit (INDEPENDENT_AMBULATORY_CARE_PROVIDER_SITE_OTHER): Payer: Medicare Other

## 2013-04-04 ENCOUNTER — Encounter: Payer: Self-pay | Admitting: Gynecology

## 2013-04-04 DIAGNOSIS — N840 Polyp of corpus uteri: Secondary | ICD-10-CM

## 2013-04-04 DIAGNOSIS — N949 Unspecified condition associated with female genital organs and menstrual cycle: Secondary | ICD-10-CM

## 2013-04-04 DIAGNOSIS — D260 Other benign neoplasm of cervix uteri: Secondary | ICD-10-CM | POA: Diagnosis not present

## 2013-04-04 DIAGNOSIS — N8500 Endometrial hyperplasia, unspecified: Secondary | ICD-10-CM

## 2013-04-04 DIAGNOSIS — N9489 Other specified conditions associated with female genital organs and menstrual cycle: Secondary | ICD-10-CM

## 2013-04-04 DIAGNOSIS — D251 Intramural leiomyoma of uterus: Secondary | ICD-10-CM

## 2013-04-04 DIAGNOSIS — N882 Stricture and stenosis of cervix uteri: Secondary | ICD-10-CM | POA: Diagnosis not present

## 2013-04-04 DIAGNOSIS — R102 Pelvic and perineal pain: Secondary | ICD-10-CM

## 2013-04-04 MED ORDER — LIDOCAINE HCL 1 % IJ SOLN
1.0000 mL | Freq: Once | INTRAMUSCULAR | Status: AC
  Administered 2013-04-04: 1 mL

## 2013-04-04 NOTE — Progress Notes (Signed)
Patient presents for sonohysterogram due to recent workup for abdominal pain CT showed probable uterine myoma. Follow up ultrasound confirmed uterine myoma but also showed thickened endometrial echo at 9 mm. No history of bleeding or other issues.  Ultrasound confirms prior findings with endometrial echo 8.7 mm. Sonohysterogram performed, sterile technique, anterior lip injected with 1% lidocaine and single-tooth tenaculum stabilization, easy catheter introduction, the distention was clear endometrial polyp 35 x 11 x 19 mm outlined. Endometrial sample with scant return. Patient tolerated well.  Assessment and plan: Endometrial polyp, postmenopausal patient without bleeding. Recommended hysteroscopic resection. I reviewed what is involved with the procedure to include the intraoperative postoperative courses. Risks of infection, prolonged antibiotics, hemorrhage necessitating transfusion and the risks of transfusion, uterine perforation, damage to internal organs including bowel, bladder, ureters, nerves necessitating major exploratory reparative surgeries including ostomy formation, bowel resection, bladder repair, ureteral damage repair, distended media distortion with metabolic complications such as coma and seizures were all discussed with her. The patient's questions were answered she wants to proceed with the surgery we'll schedule this at her convenience. She will return for a short preoperative consult before hand.

## 2013-04-04 NOTE — Patient Instructions (Signed)
Office will contact you to arrange surgery. 

## 2013-04-05 ENCOUNTER — Telehealth: Payer: Self-pay

## 2013-04-05 MED ORDER — MISOPROSTOL 200 MCG PO TABS: ORAL_TABLET | ORAL | Status: DC

## 2013-04-05 NOTE — Telephone Encounter (Signed)
Left message for patient that I have her (Hysteroscopy, D&C) outpatient surgery scheduled for Sept 4 7:30am and need to speak with her about details and to confirm date.

## 2013-04-05 NOTE — Telephone Encounter (Signed)
Patient called and confirmed that this date is fine for her.  I instructed her regarding Cytotec tab intravaginally hs before surgery. She was transferred to [facility] to schedule preop cons.

## 2013-04-09 ENCOUNTER — Telehealth: Payer: Self-pay | Admitting: *Deleted

## 2013-04-16 NOTE — Telephone Encounter (Signed)
error 

## 2013-04-22 ENCOUNTER — Encounter (HOSPITAL_COMMUNITY): Payer: Self-pay

## 2013-04-23 ENCOUNTER — Encounter (HOSPITAL_COMMUNITY)
Admission: RE | Admit: 2013-04-23 | Discharge: 2013-04-23 | Disposition: A | Discharge status: Home / Self Care | Payer: Medicare Other | Source: Ambulatory Visit | Attending: Gynecology | Admitting: Gynecology

## 2013-04-23 ENCOUNTER — Encounter (HOSPITAL_COMMUNITY): Payer: Self-pay

## 2013-04-23 ENCOUNTER — Ambulatory Visit (INDEPENDENT_AMBULATORY_CARE_PROVIDER_SITE_OTHER): Payer: Medicare Other | Admitting: Gynecology

## 2013-04-23 ENCOUNTER — Encounter: Payer: Self-pay | Admitting: Gynecology

## 2013-04-23 DIAGNOSIS — Z01818 Encounter for other preprocedural examination: Secondary | ICD-10-CM | POA: Diagnosis not present

## 2013-04-23 DIAGNOSIS — Z01810 Encounter for preprocedural cardiovascular examination: Secondary | ICD-10-CM | POA: Diagnosis not present

## 2013-04-23 DIAGNOSIS — N840 Polyp of corpus uteri: Secondary | ICD-10-CM | POA: Diagnosis not present

## 2013-04-23 DIAGNOSIS — Z01812 Encounter for preprocedural laboratory examination: Secondary | ICD-10-CM | POA: Insufficient documentation

## 2013-04-23 HISTORY — DX: Unspecified osteoarthritis, unspecified site: M19.90

## 2013-04-23 LAB — CBC
Hemoglobin: 15.1 g/dL — ABNORMAL HIGH (ref 12.0–15.0)
MCH: 30.2 pg (ref 26.0–34.0)
Platelets: 249 10*3/uL (ref 150–400)
RBC: 5 MIL/uL (ref 3.87–5.11)

## 2013-04-23 LAB — COMPREHENSIVE METABOLIC PANEL
ALT: 41 U/L — ABNORMAL HIGH (ref 0–35)
AST: 35 U/L (ref 0–37)
Alkaline Phosphatase: 87 U/L (ref 39–117)
CO2: 23 mEq/L (ref 19–32)
Calcium: 10.2 mg/dL (ref 8.4–10.5)
GFR calc non Af Amer: 84 mL/min — ABNORMAL LOW (ref >90)
Potassium: 3.9 mEq/L (ref 3.5–5.1)
Sodium: 138 mEq/L (ref 135–145)
Total Protein: 6.5 g/dL (ref 6.0–8.3)

## 2013-04-23 NOTE — H&P (Signed)
  Marilyn Rivas [birth year] [id number]   History and Physical  Chief complaint: Endometrial polyp  History of present illness: 72 y.o. had CT scan for evaluation of left lower quadrant pain which showed probable intramural myomas and an endometrial echo of 9.8 mm suggestive of a polyp. She subsequently had a sonohysterogram which confirmed a 35 x 11 x 19 mm endometrial polyp with an inadequate endometrial sample. She has no history of bleeding or other symptoms attributable to the polyp. Options for management including expectant management vs hysteroscopic resection reviewed and she wants to proceed with hysteroscopy, D&C resection of her endometrial polyp.  Past medical history,surgical history, medications, allergies, family history and social history were all reviewed and documented in the EPIC chart. ROS:  Was performed and pertinent positives and negatives are included in the history of present illness.  Exam:  Kim assistant General: well developed, well nourished female, no acute distress HEENT: normal  Lungs: clear to auscultation without wheezing, rales or rhonchi  Cardiac: regular rate without rubs, murmurs or gallops  Abdomen: soft, nontender without masses, guarding, rebound, organomegaly  Pelvic: external bus vagina: normal with atrophic changes Cervix: grossly normal with atrophic changes Uterus: normal size, midline and mobile, nontender  Adnexa: without masses or tenderness      Assessment/Plan:  72 y.o. with an endometrial polyp without bleeding. Options for expectant management versus resection discussed and she ultimately wants to proceed with resection.  I reviewed what is involved with the procedure to include the intraoperative/postoperative courses as well as the recovery period. Risks of infection, prolonged antibiotics, hemorrhage necessitating transfusion and the risks of transfusion, uterine perforation, damage to internal organs including bowel, bladder,  ureters, vessels and nerves necessitating major exploratory reparative surgeries and future reparative surgeries including ostomy formation, bowel resection, bladder repair, ureteral damage repair was all discussed. Distended media absorption with metabolic complications such as coma and seizures was also discussed with her. The patient and her daughter's questions were answered she is ready to proceed with the surgery.   Note: This document was prepared with digital dictation and possible smart phrase technology. Any transcriptional errors that result from this process are unintentional.   Marilyn Lords MD, 11:16 AM 04/23/2013

## 2013-04-23 NOTE — Pre-Procedure Instructions (Signed)
ekg reviewed by Dr. Daylene Posey prior to pt being discharged to home

## 2013-04-23 NOTE — Patient Instructions (Signed)
Followup for surgery as scheduled. 

## 2013-04-23 NOTE — Patient Instructions (Addendum)
Your procedure is scheduled on: 05/02/2013  Enter through the Main Entrance of [facility] at:  0600AM  Pick up the phone at the desk and dial [phone].  Call this number if you have problems the morning of surgery: [phone].  Remember: Do NOT eat food: AFTER MIDNIGHT 05/01/2013 Do NOT drink clear liquids after: AFTER MIDNIGHT 05/01/2013 Take these medicines the morning of surgery with a SIP OF WATER: LEVOTHYROXINE  Do NOT wear jewelry, make-up, or nail polish. Do NOT wear lotions, powders, or perfumes.  You may wear deordrant. Do NOT shave for 48 hours prior to surgery. Do NOT bring valuables to the hospital. Contacts, dentures, or bridgework may not be worn into surgery. Leave suitcase in car.  After surgery it may be brought to your room.  For patients admitted to the hospital, checkout time is 11:00 AM the day of discharge.

## 2013-04-23 NOTE — Progress Notes (Signed)
SHELBI VACCARO Sep 23, 1940 [id number]   Preoperative consult  Chief complaint: Endometrial polyp  History of present illness: 72 y.o. had CT scan for evaluation of left lower quadrant pain which showed probable intramural myomas and an endometrial echo of 9.8 mm suggestive of a polyp. She subsequently had a sonohysterogram which confirmed a 35 x 11 x 19 mm endometrial polyp with an inadequate endometrial sample. She has no history of bleeding or other symptoms attributable to the polyp. Options for management including expectant management vs hysteroscopic resection reviewed and she wants to proceed with hysteroscopy, D&C resection of her endometrial polyp.  Past medical history,surgical history, medications, allergies, family history and social history were all reviewed and documented in the EPIC chart. ROS:  Was performed and pertinent positives and negatives are included in the history of present illness.  Exam:  Kim assistant General: well developed, well nourished female, no acute distress HEENT: normal  Lungs: clear to auscultation without wheezing, rales or rhonchi  Cardiac: regular rate without rubs, murmurs or gallops  Abdomen: soft, nontender without masses, guarding, rebound, organomegaly  Pelvic: external bus vagina: normal with atrophic changes Cervix: grossly normal with atrophic changes Uterus: normal size, midline and mobile, nontender  Adnexa: without masses or tenderness      Assessment/Plan:  72 y.o. with an endometrial polyp without bleeding. Options for expectant management versus resection discussed and she ultimately wants to proceed with resection.  I reviewed what is involved with the procedure to include the intraoperative/postoperative courses as well as the recovery period. Risks of infection, prolonged antibiotics, hemorrhage necessitating transfusion and the risks of transfusion, uterine perforation, damage to internal organs including bowel, bladder,  ureters, vessels and nerves necessitating major exploratory reparative surgeries and future reparative surgeries including ostomy formation, bowel resection, bladder repair, ureteral damage repair was all discussed. Distended media absorption with metabolic complications such as coma and seizures was also discussed with her. The patient and her daughter's questions were answered she is ready to proceed with the surgery.   Note: This document was prepared with digital dictation and possible smart phrase technology. Any transcriptional errors that result from this process are unintentional.  Dara Lords MD, 11:08 AM 04/23/2013

## 2013-04-23 NOTE — Pre-Procedure Instructions (Signed)
Pt states she has titanium in her neck and knee

## 2013-04-30 DIAGNOSIS — E039 Hypothyroidism, unspecified: Secondary | ICD-10-CM | POA: Diagnosis not present

## 2013-05-01 MED ORDER — CIPROFLOXACIN IN D5W 400 MG/200ML IV SOLN
400.0000 mg | INTRAVENOUS | Status: AC
  Administered 2013-05-02: 400 mg via INTRAVENOUS
  Filled 2013-05-01: qty 200

## 2013-05-01 MED ORDER — METRONIDAZOLE IN NACL 5-0.79 MG/ML-% IV SOLN
500.0000 mg | INTRAVENOUS | Status: AC
  Administered 2013-05-02: 1 g via INTRAVENOUS
  Filled 2013-05-01: qty 100

## 2013-05-02 ENCOUNTER — Encounter (HOSPITAL_COMMUNITY)
Admission: RE | Disposition: A | Discharge status: Home / Self Care | Payer: Self-pay | Source: Ambulatory Visit | Attending: Gynecology

## 2013-05-02 ENCOUNTER — Encounter (HOSPITAL_COMMUNITY): Payer: Self-pay | Admitting: *Deleted

## 2013-05-02 ENCOUNTER — Ambulatory Visit (HOSPITAL_COMMUNITY): Payer: Medicare Other | Admitting: Anesthesiology

## 2013-05-02 ENCOUNTER — Ambulatory Visit (HOSPITAL_COMMUNITY)
Admission: RE | Admit: 2013-05-02 | Discharge: 2013-05-02 | Disposition: A | Discharge status: Home / Self Care | Payer: Medicare Other | Source: Ambulatory Visit | Attending: Gynecology | Admitting: Gynecology

## 2013-05-02 ENCOUNTER — Encounter (HOSPITAL_COMMUNITY): Payer: Self-pay | Admitting: Anesthesiology

## 2013-05-02 DIAGNOSIS — N840 Polyp of corpus uteri: Secondary | ICD-10-CM | POA: Diagnosis not present

## 2013-05-02 DIAGNOSIS — K219 Gastro-esophageal reflux disease without esophagitis: Secondary | ICD-10-CM | POA: Diagnosis not present

## 2013-05-02 DIAGNOSIS — R1032 Left lower quadrant pain: Secondary | ICD-10-CM | POA: Diagnosis not present

## 2013-05-02 HISTORY — PX: DILATATION & CURETTAGE/HYSTEROSCOPY WITH TRUECLEAR: SHX6353

## 2013-05-02 SURGERY — DILATATION & CURETTAGE/HYSTEROSCOPY WITH TRUCLEAR
Anesthesia: General | Site: Uterus | Wound class: Clean Contaminated

## 2013-05-02 MED ORDER — DEXAMETHASONE SODIUM PHOSPHATE 10 MG/ML IJ SOLN
INTRAMUSCULAR | Status: AC
  Filled 2013-05-02: qty 1

## 2013-05-02 MED ORDER — FENTANYL CITRATE 0.05 MG/ML IJ SOLN
INTRAMUSCULAR | Status: DC | PRN
  Administered 2013-05-02 (×2): 50 ug via INTRAVENOUS

## 2013-05-02 MED ORDER — LIDOCAINE HCL (CARDIAC) 20 MG/ML IV SOLN
INTRAVENOUS | Status: DC | PRN
  Administered 2013-05-02: 80 mg via INTRAVENOUS

## 2013-05-02 MED ORDER — LIDOCAINE HCL 1 % IJ SOLN
INTRAMUSCULAR | Status: DC | PRN
  Administered 2013-05-02: 9 mL

## 2013-05-02 MED ORDER — EPHEDRINE SULFATE 50 MG/ML IJ SOLN
INTRAMUSCULAR | Status: DC | PRN
  Administered 2013-05-02: 5 mg via INTRAVENOUS
  Administered 2013-05-02: 10 mg via INTRAVENOUS
  Administered 2013-05-02 (×3): 5 mg via INTRAVENOUS

## 2013-05-02 MED ORDER — FENTANYL CITRATE 0.05 MG/ML IJ SOLN: 25.0000 ug | INTRAMUSCULAR | Status: DC | PRN

## 2013-05-02 MED ORDER — DEXAMETHASONE SODIUM PHOSPHATE 10 MG/ML IJ SOLN
INTRAMUSCULAR | Status: DC | PRN
  Administered 2013-05-02: 10 mg via INTRAVENOUS

## 2013-05-02 MED ORDER — OXYCODONE-ACETAMINOPHEN 5-325 MG PO TABS: 20.0000 | ORAL_TABLET | ORAL | Status: DC | PRN

## 2013-05-02 MED ORDER — PROPOFOL 10 MG/ML IV EMUL
INTRAVENOUS | Status: AC
  Filled 2013-05-02: qty 20

## 2013-05-02 MED ORDER — ONDANSETRON HCL 4 MG/2ML IJ SOLN
INTRAMUSCULAR | Status: AC
  Filled 2013-05-02: qty 2

## 2013-05-02 MED ORDER — LACTATED RINGERS IV SOLN
INTRAVENOUS | Status: DC
  Administered 2013-05-02: 1000 mL via INTRAVENOUS

## 2013-05-02 MED ORDER — FENTANYL CITRATE 0.05 MG/ML IJ SOLN
INTRAMUSCULAR | Status: AC
  Filled 2013-05-02: qty 2

## 2013-05-02 MED ORDER — PROPOFOL 10 MG/ML IV BOLUS
INTRAVENOUS | Status: DC | PRN
  Administered 2013-05-02: 150 mg via INTRAVENOUS

## 2013-05-02 MED ORDER — SODIUM CHLORIDE 0.9 % IR SOLN
Status: DC | PRN
  Administered 2013-05-02: 3000 mL

## 2013-05-02 MED ORDER — LIDOCAINE HCL (CARDIAC) 20 MG/ML IV SOLN
INTRAVENOUS | Status: AC
  Filled 2013-05-02: qty 5

## 2013-05-02 SURGICAL SUPPLY — 20 items
BLADE INCISOR TRUC PLUS 2.9 (ABLATOR) IMPLANT
CANISTERS HI-FLOW 3000CC (CANNISTER) IMPLANT
CATH ROBINSON RED A/P 16FR (CATHETERS) ×2 IMPLANT
CLOTH BEACON ORANGE TIMEOUT ST (SAFETY) ×2 IMPLANT
CONTAINER PREFILL 10% NBF 60ML (FORM) ×4 IMPLANT
DRAPE HYSTEROSCOPY (DRAPE) ×2 IMPLANT
DRESSING TELFA 8X3 (GAUZE/BANDAGES/DRESSINGS) ×2 IMPLANT
ELECT REM PT RETURN 9FT ADLT (ELECTROSURGICAL)
ELECTRODE REM PT RTRN 9FT ADLT (ELECTROSURGICAL) IMPLANT
GLOVE BIO SURGEON STRL SZ7.5 (GLOVE) ×2 IMPLANT
GOWN STRL REIN XL XLG (GOWN DISPOSABLE) ×4 IMPLANT
INCISOR TRUC PLUS BLADE 2.9 (ABLATOR)
KIT HYSTEROSCOPY TRUCLEAR (ABLATOR) IMPLANT
MORCELLATOR RECIP TRUCLEAR 4.0 (ABLATOR) IMPLANT
NEEDLE SPNL 22GX3.5 QUINCKE BK (NEEDLE) IMPLANT
PACK VAGINAL MINOR WOMEN LF (CUSTOM PROCEDURE TRAY) ×2 IMPLANT
PAD OB MATERNITY 4.3X12.25 (PERSONAL CARE ITEMS) ×2 IMPLANT
SYR CONTROL 10ML LL (SYRINGE) ×2 IMPLANT
TOWEL OR 17X24 6PK STRL BLUE (TOWEL DISPOSABLE) ×4 IMPLANT
WATER STERILE IRR 1000ML POUR (IV SOLUTION) ×2 IMPLANT

## 2013-05-02 NOTE — H&P (Signed)
  The patient was examined.  I reviewed the proposed surgery and consent form with the patient.  The dictated history and physical is current and accurate and all questions were answered. The patient is ready to proceed with surgery and has a realistic understanding and expectation for the outcome.   Dara Lords MD, 7:12 AM 05/02/2013

## 2013-05-02 NOTE — Transfer of Care (Signed)
Immediate Anesthesia Transfer of Care Note  Patient: Marilyn Rivas  Procedure(s) Performed: Procedure(s): DILATATION & CURETTAGE/HYSTEROSCOPY WITH TRUECLEAR  (N/A)  Patient Location: PACU  Anesthesia Type:General  Level of Consciousness: awake, alert  and oriented  Airway & Oxygen Therapy: Patient Spontanous Breathing and Patient connected to nasal cannula oxygen  Post-op Assessment: Report given to PACU RN, Post -op Vital signs reviewed and stable and Patient moving all extremities  Post vital signs: Reviewed and stable  Complications: No apparent anesthesia complications

## 2013-05-02 NOTE — Anesthesia Preprocedure Evaluation (Signed)
Anesthesia Evaluation  Patient identified by MRN, date of birth, ID band Patient awake    Reviewed: Allergy & Precautions, H&P , NPO status , Patient's Chart, lab work & pertinent test results, reviewed documented beta blocker date and time   History of Anesthesia Complications Negative for: history of anesthetic complications  Airway Mallampati: II TM Distance: >3 FB Neck ROM: full    Dental  (+) Teeth Intact and Caps   Pulmonary neg pulmonary ROS,  breath sounds clear to auscultation  Pulmonary exam normal       Cardiovascular Exercise Tolerance: Good negative cardio ROS  Rhythm:regular Rate:Normal     Neuro/Psych Mild low back pain negative psych ROS   GI/Hepatic Neg liver ROS, GERD-  ,Diverticulitis, colon polyps   Endo/Other  Hypothyroidism   Renal/GU negative Renal ROS  Female GU complaint     Musculoskeletal  (+) Arthritis - (left knee), Osteoarthritis,    Abdominal   Peds  Hematology negative hematology ROS (+)   Anesthesia Other Findings   Reproductive/Obstetrics negative OB ROS                           Anesthesia Physical Anesthesia Plan  ASA: II  Anesthesia Plan: General LMA   Post-op Pain Management:    Induction:   Airway Management Planned:   Additional Equipment:   Intra-op Plan:   Post-operative Plan:   Informed Consent: I have reviewed the patients History and Physical, chart, labs and discussed the procedure including the risks, benefits and alternatives for the proposed anesthesia with the patient or authorized representative who has indicated his/her understanding and acceptance.   Dental Advisory Given  Plan Discussed with: CRNA and Surgeon  Anesthesia Plan Comments:         Anesthesia Quick Evaluation

## 2013-05-02 NOTE — Preoperative (Signed)
Beta Blockers   Reason not to administer Beta Blockers:Not Applicable 

## 2013-05-02 NOTE — Anesthesia Postprocedure Evaluation (Signed)
  Anesthesia Post-op Note  Patient: Marilyn Rivas  Procedure(s) Performed: Procedure(s): DILATATION & CURETTAGE/HYSTEROSCOPY WITH TRUECLEAR  (N/A)  Patient Location: PACU  Anesthesia Type:General  Level of Consciousness: awake, alert  and oriented  Airway and Oxygen Therapy: Patient Spontanous Breathing  Post-op Pain: none  Post-op Assessment: Post-op Vital signs reviewed, Patient's Cardiovascular Status Stable, Respiratory Function Stable, Patent Airway, No signs of Nausea or vomiting and Pain level controlled  Post-op Vital Signs: Reviewed and stable  Complications: No apparent anesthesia complications

## 2013-05-02 NOTE — Op Note (Signed)
Marilyn Rivas [birth year] [id number]   Post Operative Note   Date of surgery:  05/02/2013  Pre Op Dx:  Endometrial polyp  Post Op Dx:  Endometrial polyp  Procedure:  Hysteroscopy, D&C, Truclear resection endometrial polyp  Surgeon:  Colin Broach P  Anesthesia:  General  EBL:  Minimal  Distended media discrepancy:  100 cc saline  Complications:  None  Specimen:  #1 endometrial curettings #2 endometrial polyp to pathology  Findings: EUA:  External BUS vagina atrophic. Cervix grossly normal and atrophic. Uterus normal size midline mobile. Adnexa without masses   Hysteroscopy:  Large endometrial polyp filling the endometrial cavity from mid fundal region. One small sessile polyp anterior mid endometrial surface. One small sessile polyp posterior mid endometrial surface. Hysteroscopy otherwise normal showing an atrophic endometrial pattern. Fundus, anterior/posterior endometrial surface, lower uterine segment, endocervical canal, right/left tubal ostia all visualized   Procedure:  The patient was taken to the operating room, underwent general anesthesia, was placed in the low dorsal lithotomy position, received a perineal/vaginal preparation with Betadine solution per nursing personnel, bladder and see with in and out Foley catheterization and EUA was performed. The time out was performed by the surgical team. The patient was draped in the usual fashion. The cervix was visualized with a Peterson speculum, anterior lip grasped with a single-tooth tenaculum and a paracervical block using 10 cc 1% lidocaine was placed. The cervix was gently and gradually dilated to admit the small Truclear hysteroscope and hysteroscopy was performed with findings noted above. Using the small Truclear resector the large polyp was removed without difficulty to the level of the surrounding endometrium. 2 smaller sessile polyps were noted and these also were resected to the level of the surrounding  endometrium. A gentle sharp curettage was performed with scant return and both specimens were sent separately to pathology. Repeat hysteroscopy showed an empty cavity with good distention and hemostasis with no evidence of perforation. The tenaculum was removed and a small tear was noted in the cervical mucosa and two 3-0 interrupted Vicryl sutures were placed to reapproximate the mucosa and achieve hemostasis. The patient was then placed in the supine position, awakened without difficulty and taken to the recovery room in good condition having tolerated the procedure well. There was a fair amount of spillage on the floor and the distended media discrepancy is an estimation.   Note : This document was prepared with digital dictation and possible smart phrase technology. Any transcriptional errors that result from this process are unintentional.  Dara Lords MD, 8:37 AM 05/02/2013

## 2013-05-03 ENCOUNTER — Encounter (HOSPITAL_COMMUNITY): Payer: Self-pay | Admitting: Gynecology

## 2013-05-03 DIAGNOSIS — E038 Other specified hypothyroidism: Secondary | ICD-10-CM | POA: Diagnosis not present

## 2013-05-03 DIAGNOSIS — E785 Hyperlipidemia, unspecified: Secondary | ICD-10-CM | POA: Diagnosis not present

## 2013-05-03 DIAGNOSIS — E0500 Thyrotoxicosis with diffuse goiter without thyrotoxic crisis or storm: Secondary | ICD-10-CM | POA: Diagnosis not present

## 2013-05-03 DIAGNOSIS — R5381 Other malaise: Secondary | ICD-10-CM | POA: Diagnosis not present

## 2013-05-17 ENCOUNTER — Ambulatory Visit (INDEPENDENT_AMBULATORY_CARE_PROVIDER_SITE_OTHER): Payer: Self-pay | Admitting: Gynecology

## 2013-05-17 ENCOUNTER — Encounter: Payer: Self-pay | Admitting: Gynecology

## 2013-05-17 DIAGNOSIS — Z9889 Other specified postprocedural states: Secondary | ICD-10-CM

## 2013-05-17 DIAGNOSIS — N840 Polyp of corpus uteri: Secondary | ICD-10-CM

## 2013-05-17 NOTE — Patient Instructions (Signed)
Followup routinely, sooner if any issues

## 2013-05-17 NOTE — Progress Notes (Signed)
Patient presents for her postoperative visit status post hysteroscopy D&C resection of endometrial polyp. Has done well without complaints.  Exam was Administrator, Civil Service vagina with atrophic changes. Cervix normal. Uterus normal size midline mobile nontender. Adnexa without masses or tenderness.  Assessment and plan: Normal postoperative check status post hysteroscopic resection D&C endometrial polyp. Reviewed benign pathology with her as well as pictures from the surgery. Followup routinely. Reminded her that she still needs annual breast exams and pelvic exams.

## 2013-05-20 ENCOUNTER — Ambulatory Visit (INDEPENDENT_AMBULATORY_CARE_PROVIDER_SITE_OTHER): Payer: Medicare Other | Admitting: Internal Medicine

## 2013-05-20 ENCOUNTER — Encounter: Payer: Self-pay | Admitting: Internal Medicine

## 2013-05-20 VITALS — BP 144/81 | HR 54 | Temp 98.1°F | Resp 16 | Wt 164.0 lb

## 2013-05-20 DIAGNOSIS — R110 Nausea: Secondary | ICD-10-CM

## 2013-05-20 DIAGNOSIS — Z23 Encounter for immunization: Secondary | ICD-10-CM

## 2013-05-20 DIAGNOSIS — R1032 Left lower quadrant pain: Secondary | ICD-10-CM

## 2013-05-20 NOTE — Progress Notes (Signed)
Subjective:    Patient ID: Marilyn Rivas, female    DOB: December 15, 1940, 72 y.o.   MRN: [id number]  HPI   Marilyn Rivas is here for acute visit .  She reports over the last few months  She has had extensive work up for her GI issues.  She has seen Dr. Marina Goodell who has performed complete endoscopy and believes she is having GI spasms.  Hyoscyamine helping a little  Most days wakes up with "knot" in LLQ and has nausea with vomiting off and on for last several weeks.   No fever  No constipation  No diarrhea  No fever  She would like second opinion  She denies depressed or sad mood  Denies anhedonia.   She will be taking classes  Hyperlipidemia  She is on Crestor twice a week.    Allergies  Allergen Reactions  . Aspirin     REACTION: severe abdominal pain and excessive salavation Only takes Aleve  . Atorvastatin Other (See Comments)    myalgia  . Zofran [Ondansetron Hcl]     Headache   . Adhesive [Tape] Rash    Looks burned  . Clarithromycin Rash    Says allergic to "mycins"  . Penicillins Swelling and Rash    Hands swelling   Past Medical History  Diagnosis Date  . Thyroid disease   . Hyperlipidemia   . Menopause   . Osteopenia   . Diverticulosis   . Diverticulitis   . Hx of adenomatous colonic polyps   . Seasonal allergies   . Cataracts, both eyes   . GERD (gastroesophageal reflux disease)     HISTORY  . Arthritis    Past Surgical History  Procedure Laterality Date  . Appendectomy  1977  . Right oophorectomy  1977  . Patella reconstruction  1994    left  . Cervical fusion  1999    C5-7  . Lumbar disc surgery  2004    L5  . Cataract surgery Bilateral 06/2012  . Dilatation & curettage/hysteroscopy with trueclear N/A 05/02/2013    Procedure: DILATATION & CURETTAGE/HYSTEROSCOPY WITH TRUECLEAR ;  Surgeon: Dara Lords, MD;  Location: [facility];  Service: Gynecology;  Laterality: N/A;  . Hysteroscopy    . Dilation and curettage of uterus     History   Social  History  . Marital Status: Single    Spouse Name: N/A    Number of Children: 2  . Years of Education: N/A   Occupational History  . RESEARCH ASSIST     Retired    Social History Main Topics  . Smoking status: Former Games developer  . Smokeless tobacco: Never Used  . Alcohol Use: Yes     Comment: OCC  . Drug Use: No  . Sexual Activity: No   Other Topics Concern  . Not on file   Social History Narrative   Daily caffeine    Family History  Problem Relation Age of Onset  . Allergies Mother   . Heart disease Father   . Colon cancer Neg Hx    Patient Active Problem List   Diagnosis Date Noted  . Nausea alone 03/06/2013  . LLQ pain 03/05/2013  . Hyperglycemia 10/29/2012  . Urge incontinence of urine 10/29/2012  . Osteoporosis 05/13/2012  . Basal cell cancer 12/17/2011  . Sinus bradycardia by electrocardiogram 11/15/2011  . Hypothyroidism 09/07/2011  . DJD (degenerative joint disease) 09/07/2011  . Atrophic vaginitis 09/07/2011  . Radiculopathy of lumbar region 09/07/2011  . GERD 09/08/2010  .  DIVERTICULITIS, COLON 09/08/2010  . CONSTIPATION 10/09/2009  . PERSONAL HX COLONIC POLYPS 10/09/2009  . HYPERLIPIDEMIA-MIXED 04/10/2009   Current Outpatient Prescriptions on File Prior to Visit  Medication Sig Dispense Refill  . Calcium Citrate-Vitamin D (CALCIUM CITRATE + PO) Take 4 capsules by mouth daily. Contain 800mg  calcium citrate and other vitamins D,C, K, Mg, zinc, copper      . Cholecalciferol (VITAMIN D3) 5000 UNITS TABS Take 1 tablet by mouth daily.      . hyoscyamine (LEVSIN, ANASPAZ) 0.125 MG tablet Take 0.125 mg by mouth every 4 (four) hours as needed for cramping.      Marland Kitchen levothyroxine (SYNTHROID, LEVOTHROID) 112 MCG tablet Take 1 tablet (112 mcg total) by mouth daily.  90 tablet  1  . Probiotic Product (PROBIOTIC PO) Take 1-2 capsules by mouth daily.       Marland Kitchen triamcinolone ointment (KENALOG) 0.1 % Apply 1 application topically daily as needed (to red spots on arm).         No current facility-administered medications on file prior to visit.     Review of Systems See HPI    Objective:   Physical Exam Physical Exam  Nursing note and vitals reviewed.  Constitutional: She is oriented to person, place, and time. She appears well-developed and well-nourished.  HENT:  Head: Normocephalic and atraumatic.  Cardiovascular: Normal rate and regular rhythm. Exam reveals no gallop and no friction rub.  No murmur heard.  Pulmonary/Chest: Breath sounds normal. She has no wheezes. She has no rales.  Abd:  Bowel sounds hypoactive but present.  nondistended nontender in all quadrants on todays exam Neurological: She is alert and oriented to person, place, and time.  Skin: Skin is warm and dry.  Psychiatric: She has a normal mood and affect. Her behavior is normal.             Assessment & Plan:  Abd pain and nausea :  Pt would like second opinion.   She wishes to see someone in [facility]  And will discuss with her daughter and get back to me.   Hyperlipidemia  On Crestor  Two times a week  Will give influenza today

## 2013-06-03 DIAGNOSIS — L988 Other specified disorders of the skin and subcutaneous tissue: Secondary | ICD-10-CM | POA: Diagnosis not present

## 2013-06-03 DIAGNOSIS — D2360 Other benign neoplasm of skin of unspecified upper limb, including shoulder: Secondary | ICD-10-CM | POA: Diagnosis not present

## 2013-06-03 DIAGNOSIS — D485 Neoplasm of uncertain behavior of skin: Secondary | ICD-10-CM | POA: Diagnosis not present

## 2013-06-03 DIAGNOSIS — L259 Unspecified contact dermatitis, unspecified cause: Secondary | ICD-10-CM | POA: Diagnosis not present

## 2013-06-03 DIAGNOSIS — L738 Other specified follicular disorders: Secondary | ICD-10-CM | POA: Diagnosis not present

## 2013-06-03 DIAGNOSIS — Z85828 Personal history of other malignant neoplasm of skin: Secondary | ICD-10-CM | POA: Diagnosis not present

## 2013-06-12 ENCOUNTER — Ambulatory Visit (INDEPENDENT_AMBULATORY_CARE_PROVIDER_SITE_OTHER): Payer: Medicare Other | Admitting: Internal Medicine

## 2013-06-12 ENCOUNTER — Encounter: Payer: Self-pay | Admitting: Internal Medicine

## 2013-06-12 VITALS — BP 128/80 | HR 81 | Ht 60.5 in | Wt 165.5 lb

## 2013-06-12 DIAGNOSIS — R140 Abdominal distension (gaseous): Secondary | ICD-10-CM

## 2013-06-12 DIAGNOSIS — R112 Nausea with vomiting, unspecified: Secondary | ICD-10-CM

## 2013-06-12 DIAGNOSIS — K219 Gastro-esophageal reflux disease without esophagitis: Secondary | ICD-10-CM | POA: Diagnosis not present

## 2013-06-12 DIAGNOSIS — R141 Gas pain: Secondary | ICD-10-CM | POA: Diagnosis not present

## 2013-06-12 MED ORDER — DEXLANSOPRAZOLE 60 MG PO CPDR: 60.0000 mg | DELAYED_RELEASE_CAPSULE | Freq: Every day | ORAL | Status: DC

## 2013-06-12 MED ORDER — PROMETHAZINE HCL 12.5 MG PO TABS: ORAL_TABLET | ORAL | Status: DC

## 2013-06-12 NOTE — Progress Notes (Signed)
HISTORY OF PRESENT ILLNESS:  Marilyn Rivas is a 72 y.o. female with past medical history as listed below. She has been seen in this office for a history of GERD, dysphagia, and adenomatous polyp surveillance. She presents today with a chief complaint of nausea with occasional vomiting. Patient states that her symptoms have been going on for several months. She was evaluated here in July for similar complaints. CT scan of the abdomen and pelvis was negative except for incidental uterine fibroid which she has subsequently had removed. She did undergo upper endoscopy which was normal. Empiric dilation of the esophagus was performed. We also performed gastric emptying scan. This was normal. Her last colonoscopy was performed February 2012. This was normal except for severe diverticulosis throughout. The patient describes a gagging sensation in the morning. As well, occasional vomiting approximately 2-3 times per month. This generally occurs mid day approximately one to 2 hours after lunch. She does have an hours worth of warning or more. She reports a previous problems with diarrhea have improved. She's not on PPI. She does not tolerate Zofran but does tolerate Phenergan. Levsin sublingual does help intermittent abdominal discomfort. She has difficulties with certain foods such as beef or greasy items. There has been no weight loss. Notes from her PCPs office states that  the patient was interested in a second GI opinion. Patient tells me that the PCP was interested in a second opinion ...  REVIEW OF SYSTEMS:  All non-GI ROS negative except for arthritis   Past Medical History  Diagnosis Date  . Thyroid disease   . Hyperlipidemia   . Menopause   . Osteopenia   . Diverticulosis   . Diverticulitis   . Hx of adenomatous colonic polyps   . Seasonal allergies   . Cataracts, both eyes   . GERD (gastroesophageal reflux disease)     HISTORY  . Arthritis     Past Surgical History  Procedure  Laterality Date  . Appendectomy  1977  . Right oophorectomy  1977  . Patella reconstruction  1994    left  . Cervical fusion  1999    C5-7  . Lumbar disc surgery  2004    L5  . Cataract surgery Bilateral 06/2012  . Dilatation & curettage/hysteroscopy with trueclear N/A 05/02/2013    Procedure: DILATATION & CURETTAGE/HYSTEROSCOPY WITH TRUECLEAR ;  Surgeon: Dara Lords, MD;  Location: [facility];  Service: Gynecology;  Laterality: N/A;  . Hysteroscopy    . Dilation and curettage of uterus      Social History Marilyn Rivas  reports that she has quit smoking. She has never used smokeless tobacco. She reports that she drinks alcohol. She reports that she does not use illicit drugs.  family history includes Allergies in her mother; Heart disease in her father. There is no history of Colon cancer.  Allergies  Allergen Reactions  . Aspirin     REACTION: severe abdominal pain and excessive salavation Only takes Aleve  . Atorvastatin Other (See Comments)    myalgia  . Zofran [Ondansetron Hcl]     Headache   . Adhesive [Tape] Rash    Looks burned  . Clarithromycin Rash    Says allergic to "mycins"  . Penicillins Swelling and Rash    Hands swelling       PHYSICAL EXAMINATION: Vital signs: BP 128/80  Pulse 81  Ht 5' 0.5" (1.537 m)  Wt 165 lb 8 oz (75.07 kg)  BMI 31.78 kg/m2  SpO2 97%  General: Well-developed, well-nourished, no acute distress HEENT: Sclerae are anicteric, conjunctiva pink. Oral mucosa intact Lungs: Clear Heart: Regular Abdomen: soft, obese , nontender, nondistended, no obvious ascites, no peritoneal signs, normal bowel sounds. No organomegaly. Extremities: No edema Psychiatric: alert and oriented x3. Cooperative    ASSESSMENT:  #1. Complaints of gagging sensation and intermittent vomiting as described. Negative extensive workup. Suspect reflux equivalent #2. History of adenomatous colon polyps. Last colonoscopy February 2012 negative for  neoplasia #3. GERD. Currently on no therapy  PLAN:  #1. Reflux precautions #2. Dexilant 60 mg daily. Multiple samples and prescription provided #3. Phenergan 12.5 mg, one or 2 every 4-6 hours when necessary nausea #4. Continue Levsin as needed #5. GI followup in 2 months

## 2013-06-12 NOTE — Patient Instructions (Signed)
We have sent the following medications to your pharmacy for you to pick up at your convenience:  Dexilant and Phenergan

## 2013-06-28 DIAGNOSIS — E785 Hyperlipidemia, unspecified: Secondary | ICD-10-CM | POA: Diagnosis not present

## 2013-06-28 DIAGNOSIS — R5381 Other malaise: Secondary | ICD-10-CM | POA: Diagnosis not present

## 2013-06-28 DIAGNOSIS — E038 Other specified hypothyroidism: Secondary | ICD-10-CM | POA: Diagnosis not present

## 2013-06-28 DIAGNOSIS — E0500 Thyrotoxicosis with diffuse goiter without thyrotoxic crisis or storm: Secondary | ICD-10-CM | POA: Diagnosis not present

## 2013-07-02 DIAGNOSIS — E785 Hyperlipidemia, unspecified: Secondary | ICD-10-CM | POA: Diagnosis not present

## 2013-07-02 DIAGNOSIS — R5381 Other malaise: Secondary | ICD-10-CM | POA: Diagnosis not present

## 2013-07-02 DIAGNOSIS — E038 Other specified hypothyroidism: Secondary | ICD-10-CM | POA: Diagnosis not present

## 2013-07-02 DIAGNOSIS — E0500 Thyrotoxicosis with diffuse goiter without thyrotoxic crisis or storm: Secondary | ICD-10-CM | POA: Diagnosis not present

## 2013-07-31 DIAGNOSIS — M25559 Pain in unspecified hip: Secondary | ICD-10-CM | POA: Diagnosis not present

## 2013-07-31 DIAGNOSIS — M1710 Unilateral primary osteoarthritis, unspecified knee: Secondary | ICD-10-CM | POA: Diagnosis not present

## 2013-09-02 ENCOUNTER — Telehealth: Payer: Self-pay | Admitting: Internal Medicine

## 2013-09-12 NOTE — Telephone Encounter (Signed)
Called patient to discuss her Dexilant concerns as well as apologize for taking so long to address them.  I told her that not calling earlier had been an oversight and I would be happy to help her in any way.   Patient was irate and felt she had been treated negligently.  I apologized repeatedly and asked what I could do make it up to her and fix the situation.  She told me there was nothing I could do to make it up to her.  She stated the pharmacy had suggested a a cheaper alternative to [place] which she had tried and had success with.  She did not want to discuss it with me anymore and hung up.

## 2013-09-24 ENCOUNTER — Encounter: Payer: Self-pay | Admitting: Internal Medicine

## 2013-09-24 ENCOUNTER — Ambulatory Visit (INDEPENDENT_AMBULATORY_CARE_PROVIDER_SITE_OTHER): Payer: Medicare Other | Admitting: Internal Medicine

## 2013-09-24 VITALS — BP 143/81 | HR 68 | Temp 98.3°F

## 2013-09-24 DIAGNOSIS — J029 Acute pharyngitis, unspecified: Secondary | ICD-10-CM

## 2013-09-24 DIAGNOSIS — R05 Cough: Secondary | ICD-10-CM

## 2013-09-24 DIAGNOSIS — R059 Cough, unspecified: Secondary | ICD-10-CM

## 2013-09-24 DIAGNOSIS — E785 Hyperlipidemia, unspecified: Secondary | ICD-10-CM

## 2013-09-24 MED ORDER — HYDROCODONE-HOMATROPINE 5-1.5 MG/5ML PO SYRP: 5.0000 mL | ORAL_SOLUTION | Freq: Three times a day (TID) | ORAL | Status: DC | PRN

## 2013-09-24 MED ORDER — AZITHROMYCIN 250 MG PO TABS: ORAL_TABLET | ORAL | Status: DC

## 2013-09-24 NOTE — Progress Notes (Signed)
Patient here with c/o very sore throat - wants test for strep throat. Rapid strep test positive.

## 2013-09-24 NOTE — Patient Instructions (Signed)
Schedule CPE  See me if not better

## 2013-09-24 NOTE — Progress Notes (Signed)
Subjective:    Patient ID: Marilyn Rivas, female    DOB: [birth year], 73 y.o.   MRN: [id number]  HPI  [name] is here for acute visit.  Sore throat for 2 days.  Exposure to someone with [place] illness.  No fever mild cough.  No chest pain.    Pt also reports she stopped her Crestor as she does not like it.  She is trying to exercise  Allergies  Allergen Reactions  . Aspirin     REACTION: severe abdominal pain and excessive salavation Only takes Aleve  . Atorvastatin Other (See Comments)    myalgia  . Zofran [Ondansetron Hcl]     Headache   . Adhesive [Tape] Rash    Looks burned  . Clarithromycin Rash    Says allergic to "mycins"  . Penicillins Swelling and Rash    Hands swelling   Past Medical History  Diagnosis Date  . Thyroid disease   . Hyperlipidemia   . Menopause   . Osteopenia   . Diverticulosis   . Diverticulitis   . Hx of adenomatous colonic polyps   . Seasonal allergies   . Cataracts, both eyes   . GERD (gastroesophageal reflux disease)     HISTORY  . Arthritis    Past Surgical History  Procedure Laterality Date  . Appendectomy  1977  . Right oophorectomy  1977  . Patella reconstruction  1994    left  . Cervical fusion  1999    C5-7  . Lumbar disc surgery  2004    L5  . Cataract surgery Bilateral 06/2012  . Dilatation & curettage/hysteroscopy with trueclear N/A 05/02/2013    Procedure: DILATATION & CURETTAGE/HYSTEROSCOPY WITH TRUECLEAR ;  Surgeon: Anastasio Auerbach, MD;  Location: [facility];  Service: Gynecology;  Laterality: N/A;  . Hysteroscopy    . Dilation and curettage of uterus     History   Social History  . Marital Status: Single    Spouse Name: N/A    Number of Children: 2  . Years of Education: N/A   Occupational History  . RESEARCH ASSIST     Retired    Social History Main Topics  . Smoking status: Former Research scientist (life sciences)  . Smokeless tobacco: Never Used  . Alcohol Use: Yes     Comment: OCC  . Drug Use: No  . Sexual Activity: No    Other Topics Concern  . Not on file   Social History Narrative   Daily caffeine    Family History  Problem Relation Age of Onset  . Allergies Mother   . Heart disease Father   . Colon cancer Neg Hx    Patient Active Problem List   Diagnosis Date Noted  . Abdominal pain, left lower quadrant 05/20/2013  . Nausea alone 03/06/2013  . LLQ pain 03/05/2013  . Hyperglycemia 10/29/2012  . Urge incontinence of urine 10/29/2012  . Osteoporosis 05/13/2012  . Basal cell cancer 12/17/2011  . Sinus bradycardia by electrocardiogram 11/15/2011  . Hypothyroidism 09/07/2011  . DJD (degenerative joint disease) 09/07/2011  . Atrophic vaginitis 09/07/2011  . Radiculopathy of lumbar region 09/07/2011  . GERD 09/08/2010  . DIVERTICULITIS, COLON 09/08/2010  . CONSTIPATION 10/09/2009  . PERSONAL HX COLONIC POLYPS 10/09/2009  . HYPERLIPIDEMIA-MIXED 04/10/2009   Current Outpatient Prescriptions on File Prior to Visit  Medication Sig Dispense Refill  . Calcium Citrate-Vitamin D (CALCIUM CITRATE + PO) Take 4 capsules by mouth daily. Contain 800mg  calcium citrate and other vitamins D,C, K, Mg,  zinc, copper      . Cholecalciferol (VITAMIN D3) 5000 UNITS TABS Take 1 tablet by mouth daily.      . hyoscyamine (LEVSIN, ANASPAZ) 0.125 MG tablet Take 0.125 mg by mouth every 4 (four) hours as needed for cramping.      Marland Kitchen levothyroxine (SYNTHROID, LEVOTHROID) 112 MCG tablet Take 1 tablet (112 mcg total) by mouth daily.  90 tablet  1  . Probiotic Product (PROBIOTIC PO) Take 1-2 capsules by mouth daily.       Marland Kitchen triamcinolone ointment (KENALOG) 0.1 % Apply 1 application topically daily as needed (to red spots on arm).       Marland Kitchen dexlansoprazole (DEXILANT) 60 MG capsule Take 1 capsule (60 mg total) by mouth daily.  30 capsule  6  . promethazine (PHENERGAN) 12.5 MG tablet Take 1-2 tablets every 4-6 hours for nausea  30 tablet  1  . rosuvastatin (CRESTOR) 5 MG tablet Take 5 mg by mouth 2 (two) times a week.       No  current facility-administered medications on file prior to visit.      Review of Systems    see HPI Objective:   Physical Exam Physical Exam  Constitutional: She is oriented to person, place, and time. She appears well-developed and well-nourished. She is cooperative.  HENT:  Head: Normocephalic and atraumatic.  Right Ear: A middle ear effusion is present.  Left Ear: A middle ear effusion is present.  Nose: Mucosal edema present.  Mouth/Throat: Oropharyngeal exudate and posterior oropharyngeal erythema present.  Serous effusion bilaterally  Eyes: Conjunctivae and EOM are normal. Pupils are equal, round, and reactive to light.  Neck: Neck supple. Carotid bruit is not present. No mass present.  Cardiovascular: Regular rhythm, normal heart sounds, intact distal pulses and normal pulses. Exam reveals no gallop and no friction rub.  No murmur heard.  Pulmonary/Chest: Breath sounds normal. She has no wheezes. She has no rhonchi. She has no rales.  Lymphadenopathy:  She has cervical adenopathy.  Neurological: She is alert and oriented to person, place, and time.  Skin: Skin is warm and dry. No abrasion, no bruising, no ecchymosis and no rash noted. No cyanosis. Nails show no clubbing.  Psychiatric: She has a normal mood and affect. Her speech is normal and behavior is normal.        Assessment & Plan:  Pharyngitis   Will give Z-pak  Strep positive  Cough  Ok for Hycodan  If coughing gets worse    Hyperlipidemia  Off statin now.    See me if not better.  Schedule CPE

## 2013-09-26 ENCOUNTER — Telehealth: Payer: Self-pay | Admitting: *Deleted

## 2013-09-26 NOTE — Telephone Encounter (Signed)
Pt called and states that she still has a sore throat and congestion.advised pt to use over the counter decongestant and spray for sore throat

## 2013-10-23 ENCOUNTER — Other Ambulatory Visit: Payer: Self-pay | Admitting: Dermatology

## 2013-10-23 DIAGNOSIS — D485 Neoplasm of uncertain behavior of skin: Secondary | ICD-10-CM | POA: Diagnosis not present

## 2013-10-23 DIAGNOSIS — C44611 Basal cell carcinoma of skin of unspecified upper limb, including shoulder: Secondary | ICD-10-CM | POA: Diagnosis not present

## 2013-10-23 DIAGNOSIS — L259 Unspecified contact dermatitis, unspecified cause: Secondary | ICD-10-CM | POA: Diagnosis not present

## 2013-10-28 DIAGNOSIS — M1710 Unilateral primary osteoarthritis, unspecified knee: Secondary | ICD-10-CM | POA: Diagnosis not present

## 2013-11-12 DIAGNOSIS — C44611 Basal cell carcinoma of skin of unspecified upper limb, including shoulder: Secondary | ICD-10-CM | POA: Diagnosis not present

## 2013-11-12 DIAGNOSIS — M1710 Unilateral primary osteoarthritis, unspecified knee: Secondary | ICD-10-CM | POA: Diagnosis not present

## 2013-12-02 ENCOUNTER — Other Ambulatory Visit: Payer: Self-pay | Admitting: Physician Assistant

## 2013-12-09 ENCOUNTER — Encounter (HOSPITAL_BASED_OUTPATIENT_CLINIC_OR_DEPARTMENT_OTHER): Payer: Self-pay | Admitting: *Deleted

## 2013-12-11 NOTE — H&P (Signed)
  [facility] [address] [place], [place] [address] [phone] A Division of [facility]  [name], M.D.   Robert A. Noemi Chapel, M.D.   Faythe Casa, M.D.   Johnny Bridge, M.D.   Almedia Balls, M.D Ernesta Amble. Percell Miller, M.D.  Joseph Pierini, M.D.  Lanier Prude, M.D.    Verner Chol, M.D. Mary L. Fenton Malling, PA-C  Kirstin A. Shepperson, PA-C  Josh Suncrest, PA-C [place], [place]   RE: Marilyn Rivas, Marilyn Rivas                                [phone]      DOB: 1941/05/25 PROGRESS NOTE: 11-12-13 [name] comes in for evaluation of her left knee.  Steadily increasing symptoms anterior, as well as retropatellar.  She underwent open reduction internal fixation comminuted patella fracture by me back in 1994.  Two cannulated 3.5 screws and figure-of-eight wire.  Her current symptoms are from the hardware in the front of her knee, but also retropatellar symptoms with stairs, squatting and ambulating.  She would like to entertain removal of hardware and something to address the retropatellar irritation.  X-rays were completed back in December showing her fracture to be healed.  There are some degenerative changes in the patellofemoral joint, but nothing extreme.   Remaining history and general exam is outlined and included in the chart.  EXAMINATION: Specifically, healthy 73 year-old female.  A little bit of an antalgic gait on the left.  Left knee has full motion.  Intact extensor mechanism.  Painful hardware.  Well healed incision.  There is 2+ patellofemoral crepitus.  A little pain with patella compression, but no instability.  No meniscal signs.  Neurovascularly intact distally.    DISPOSITION:  We have discussed definitive treatment.  Open removal of her screws and wires.  Arthroscopy with chondroplasty patellofemoral joint.  Procedure, risks, benefits and complications reviewed.  It is the onset of night pain and  rest pain that is bothering her the most that I think is coming from her hardware, as well as intraarticular findings.  What to anticipate intra and post-op reviewed.  She understands and agrees.  I will see her at the time of operative intervention.    [name], M.D.   Electronically verified by [name], M.D. DFM:jjh D 11-12-13 T 11-13-13

## 2013-12-12 ENCOUNTER — Ambulatory Visit (HOSPITAL_BASED_OUTPATIENT_CLINIC_OR_DEPARTMENT_OTHER)
Admission: RE | Admit: 2013-12-12 | Discharge: 2013-12-12 | Disposition: A | Discharge status: Home / Self Care | Payer: Medicare Other | Source: Ambulatory Visit | Attending: Orthopedic Surgery | Admitting: Orthopedic Surgery

## 2013-12-12 ENCOUNTER — Encounter (HOSPITAL_BASED_OUTPATIENT_CLINIC_OR_DEPARTMENT_OTHER): Payer: Medicare Other | Admitting: Anesthesiology

## 2013-12-12 ENCOUNTER — Encounter (HOSPITAL_BASED_OUTPATIENT_CLINIC_OR_DEPARTMENT_OTHER): Payer: Self-pay | Admitting: *Deleted

## 2013-12-12 ENCOUNTER — Ambulatory Visit (HOSPITAL_BASED_OUTPATIENT_CLINIC_OR_DEPARTMENT_OTHER): Payer: Medicare Other | Admitting: Anesthesiology

## 2013-12-12 ENCOUNTER — Encounter (HOSPITAL_BASED_OUTPATIENT_CLINIC_OR_DEPARTMENT_OTHER)
Admission: RE | Disposition: A | Discharge status: Home / Self Care | Payer: Self-pay | Source: Ambulatory Visit | Attending: Orthopedic Surgery

## 2013-12-12 DIAGNOSIS — Z6832 Body mass index (BMI) 32.0-32.9, adult: Secondary | ICD-10-CM | POA: Diagnosis not present

## 2013-12-12 DIAGNOSIS — M24569 Contracture, unspecified knee: Secondary | ICD-10-CM | POA: Diagnosis not present

## 2013-12-12 DIAGNOSIS — E039 Hypothyroidism, unspecified: Secondary | ICD-10-CM | POA: Diagnosis not present

## 2013-12-12 DIAGNOSIS — K219 Gastro-esophageal reflux disease without esophagitis: Secondary | ICD-10-CM | POA: Insufficient documentation

## 2013-12-12 DIAGNOSIS — M1990 Unspecified osteoarthritis, unspecified site: Secondary | ICD-10-CM | POA: Diagnosis not present

## 2013-12-12 DIAGNOSIS — E669 Obesity, unspecified: Secondary | ICD-10-CM | POA: Insufficient documentation

## 2013-12-12 DIAGNOSIS — M2240 Chondromalacia patellae, unspecified knee: Secondary | ICD-10-CM | POA: Diagnosis not present

## 2013-12-12 DIAGNOSIS — T8489XA Other specified complication of internal orthopedic prosthetic devices, implants and grafts, initial encounter: Secondary | ICD-10-CM | POA: Diagnosis not present

## 2013-12-12 DIAGNOSIS — G8918 Other acute postprocedural pain: Secondary | ICD-10-CM | POA: Diagnosis not present

## 2013-12-12 DIAGNOSIS — Z87891 Personal history of nicotine dependence: Secondary | ICD-10-CM | POA: Diagnosis not present

## 2013-12-12 DIAGNOSIS — M25669 Stiffness of unspecified knee, not elsewhere classified: Secondary | ICD-10-CM | POA: Diagnosis not present

## 2013-12-12 DIAGNOSIS — T84498A Other mechanical complication of other internal orthopedic devices, implants and grafts, initial encounter: Secondary | ICD-10-CM | POA: Diagnosis not present

## 2013-12-12 DIAGNOSIS — Y834 Other reconstructive surgery as the cause of abnormal reaction of the patient, or of later complication, without mention of misadventure at the time of the procedure: Secondary | ICD-10-CM | POA: Insufficient documentation

## 2013-12-12 DIAGNOSIS — M25569 Pain in unspecified knee: Secondary | ICD-10-CM | POA: Diagnosis not present

## 2013-12-12 HISTORY — PX: HARDWARE REMOVAL: SHX979

## 2013-12-12 HISTORY — PX: KNEE ARTHROSCOPY: SHX127

## 2013-12-12 LAB — POCT HEMOGLOBIN-HEMACUE: Hemoglobin: 15.3 g/dL — ABNORMAL HIGH (ref 12.0–15.0)

## 2013-12-12 SURGERY — REMOVAL, HARDWARE
Anesthesia: General | Site: Knee | Laterality: Left

## 2013-12-12 MED ORDER — MIDAZOLAM HCL 2 MG/2ML IJ SOLN
1.0000 mg | INTRAMUSCULAR | Status: DC | PRN
  Administered 2013-12-12: 2 mg via INTRAVENOUS

## 2013-12-12 MED ORDER — LACTATED RINGERS IV SOLN
INTRAVENOUS | Status: DC
  Administered 2013-12-12: 11:00:00 via INTRAVENOUS

## 2013-12-12 MED ORDER — OXYCODONE HCL 5 MG PO TABS
5.0000 mg | ORAL_TABLET | Freq: Once | ORAL | Status: AC | PRN
  Administered 2013-12-12: 5 mg via ORAL

## 2013-12-12 MED ORDER — FENTANYL CITRATE 0.05 MG/ML IJ SOLN: 50.0000 ug | INTRAMUSCULAR | Status: DC | PRN

## 2013-12-12 MED ORDER — FENTANYL CITRATE 0.05 MG/ML IJ SOLN
INTRAMUSCULAR | Status: DC | PRN
  Administered 2013-12-12 (×4): 25 ug via INTRAVENOUS

## 2013-12-12 MED ORDER — SODIUM CHLORIDE 0.9 % IR SOLN
Status: DC | PRN
  Administered 2013-12-12: 1000 mL

## 2013-12-12 MED ORDER — HYDROMORPHONE HCL PF 1 MG/ML IJ SOLN
0.2500 mg | INTRAMUSCULAR | Status: DC | PRN
  Administered 2013-12-12 (×2): 0.25 mg via INTRAVENOUS
  Administered 2013-12-12: 0.5 mg via INTRAVENOUS

## 2013-12-12 MED ORDER — DEXAMETHASONE SODIUM PHOSPHATE 10 MG/ML IJ SOLN
INTRAMUSCULAR | Status: DC | PRN
  Administered 2013-12-12: 4 mg

## 2013-12-12 MED ORDER — PROMETHAZINE HCL 25 MG/ML IJ SOLN: 6.2500 mg | INTRAMUSCULAR | Status: DC | PRN

## 2013-12-12 MED ORDER — CHLORHEXIDINE GLUCONATE 4 % EX LIQD: 60.0000 mL | Freq: Once | CUTANEOUS | Status: DC

## 2013-12-12 MED ORDER — MIDAZOLAM HCL 2 MG/2ML IJ SOLN: 1.0000 mg | INTRAMUSCULAR | Status: DC | PRN

## 2013-12-12 MED ORDER — FENTANYL CITRATE 0.05 MG/ML IJ SOLN
50.0000 ug | Freq: Once | INTRAMUSCULAR | Status: AC
  Administered 2013-12-12: 100 ug via INTRAVENOUS

## 2013-12-12 MED ORDER — FENTANYL CITRATE 0.05 MG/ML IJ SOLN
INTRAMUSCULAR | Status: AC
  Filled 2013-12-12: qty 2

## 2013-12-12 MED ORDER — VANCOMYCIN HCL IN DEXTROSE 1-5 GM/200ML-% IV SOLN
INTRAVENOUS | Status: AC
  Filled 2013-12-12: qty 200

## 2013-12-12 MED ORDER — OXYCODONE HCL 5 MG PO TABS
ORAL_TABLET | ORAL | Status: AC
  Filled 2013-12-12: qty 1

## 2013-12-12 MED ORDER — MIDAZOLAM HCL 2 MG/2ML IJ SOLN
INTRAMUSCULAR | Status: AC
  Filled 2013-12-12: qty 2

## 2013-12-12 MED ORDER — HYDROMORPHONE HCL PF 1 MG/ML IJ SOLN
INTRAMUSCULAR | Status: AC
  Filled 2013-12-12: qty 1

## 2013-12-12 MED ORDER — VANCOMYCIN HCL IN DEXTROSE 1-5 GM/200ML-% IV SOLN
1000.0000 mg | INTRAVENOUS | Status: AC
  Administered 2013-12-12: 1000 mg via INTRAVENOUS

## 2013-12-12 MED ORDER — OXYCODONE-ACETAMINOPHEN 5-325 MG PO TABS: 1.0000 | ORAL_TABLET | ORAL | Status: DC | PRN

## 2013-12-12 MED ORDER — PROPOFOL 10 MG/ML IV BOLUS
INTRAVENOUS | Status: DC | PRN
  Administered 2013-12-12: 200 mg via INTRAVENOUS

## 2013-12-12 MED ORDER — DEXAMETHASONE SODIUM PHOSPHATE 4 MG/ML IJ SOLN
INTRAMUSCULAR | Status: DC | PRN
  Administered 2013-12-12: 10 mg via INTRAVENOUS

## 2013-12-12 MED ORDER — BUPIVACAINE-EPINEPHRINE PF 0.5-1:200000 % IJ SOLN
INTRAMUSCULAR | Status: DC | PRN
  Administered 2013-12-12: 30 mL

## 2013-12-12 MED ORDER — FENTANYL CITRATE 0.05 MG/ML IJ SOLN
INTRAMUSCULAR | Status: AC
  Filled 2013-12-12: qty 4

## 2013-12-12 MED ORDER — OXYCODONE HCL 5 MG/5ML PO SOLN: 5.0000 mg | Freq: Once | ORAL | Status: AC | PRN

## 2013-12-12 MED ORDER — LIDOCAINE HCL (CARDIAC) 20 MG/ML IV SOLN
INTRAVENOUS | Status: DC | PRN
  Administered 2013-12-12: 50 mg via INTRAVENOUS

## 2013-12-12 SURGICAL SUPPLY — 90 items
BANDAGE ELASTIC 4 VELCRO ST LF (GAUZE/BANDAGES/DRESSINGS) IMPLANT
BANDAGE ELASTIC 6 VELCRO ST LF (GAUZE/BANDAGES/DRESSINGS) ×3 IMPLANT
BANDAGE ESMARK 6X9 LF (GAUZE/BANDAGES/DRESSINGS) ×1 IMPLANT
BENZOIN TINCTURE PRP APPL 2/3 (GAUZE/BANDAGES/DRESSINGS) IMPLANT
BLADE CUDA 5.5 (BLADE) IMPLANT
BLADE CUDA GRT WHITE 3.5 (BLADE) IMPLANT
BLADE CUTTER GATOR 3.5 (BLADE) ×3 IMPLANT
BLADE CUTTER MENIS 5.5 (BLADE) IMPLANT
BLADE GREAT WHITE 4.2 (BLADE) ×2 IMPLANT
BLADE GREAT WHITE 4.2MM (BLADE) ×1
BLADE SURG 15 STRL LF DISP TIS (BLADE) ×3 IMPLANT
BLADE SURG 15 STRL SS (BLADE) ×6
BNDG COHESIVE 4X5 TAN STRL (GAUZE/BANDAGES/DRESSINGS) IMPLANT
BNDG ESMARK 4X9 LF (GAUZE/BANDAGES/DRESSINGS) IMPLANT
BNDG ESMARK 6X9 LF (GAUZE/BANDAGES/DRESSINGS) ×3
BUR OVAL 4.0 (BURR) IMPLANT
CANISTER SUCT 1200ML W/VALVE (MISCELLANEOUS) IMPLANT
CANISTER SUCT 3000ML (MISCELLANEOUS) IMPLANT
CLOSURE WOUND 1/2 X4 (GAUZE/BANDAGES/DRESSINGS)
COVER MAYO STAND STRL (DRAPES) IMPLANT
COVER TABLE BACK 60X90 (DRAPES) IMPLANT
CUFF TOURNIQUET SINGLE 18IN (TOURNIQUET CUFF) IMPLANT
CUFF TOURNIQUET SINGLE 34IN LL (TOURNIQUET CUFF) ×3 IMPLANT
CUTTER MENISCUS  4.2MM (BLADE)
CUTTER MENISCUS 4.2MM (BLADE) IMPLANT
DECANTER SPIKE VIAL GLASS SM (MISCELLANEOUS) IMPLANT
DRAPE ARTHROSCOPY W/POUCH 114 (DRAPES) ×3 IMPLANT
DRAPE ARTHROSCOPY W/POUCH 90 (DRAPES) ×3 IMPLANT
DRAPE EXTREMITY T 121X128X90 (DRAPE) IMPLANT
DRAPE OEC MINIVIEW 54X84 (DRAPES) IMPLANT
DRAPE U-SHAPE 47X51 STRL (DRAPES) ×3 IMPLANT
DURAPREP 26ML APPLICATOR (WOUND CARE) ×3 IMPLANT
ELECT MENISCUS 165MM 90D (ELECTRODE) IMPLANT
ELECT REM PT RETURN 9FT ADLT (ELECTROSURGICAL) ×3
ELECTRODE REM PT RTRN 9FT ADLT (ELECTROSURGICAL) ×1 IMPLANT
GAUZE SPONGE 4X4 16PLY XRAY LF (GAUZE/BANDAGES/DRESSINGS) IMPLANT
GAUZE XEROFORM 1X8 LF (GAUZE/BANDAGES/DRESSINGS) ×3 IMPLANT
GLOVE BIO SURGEON STRL SZ 6.5 (GLOVE) ×2 IMPLANT
GLOVE BIO SURGEONS STRL SZ 6.5 (GLOVE) ×1
GLOVE BIOGEL PI IND STRL 7.0 (GLOVE) ×2 IMPLANT
GLOVE BIOGEL PI INDICATOR 7.0 (GLOVE) ×4
GLOVE ECLIPSE 6.5 STRL STRAW (GLOVE) ×3 IMPLANT
GLOVE EXAM NITRILE LRG STRL (GLOVE) ×3 IMPLANT
GLOVE ORTHO TXT STRL SZ7.5 (GLOVE) ×3 IMPLANT
GOWN STRL REUS W/ TWL LRG LVL3 (GOWN DISPOSABLE) ×3 IMPLANT
GOWN STRL REUS W/ TWL XL LVL3 (GOWN DISPOSABLE) IMPLANT
GOWN STRL REUS W/TWL LRG LVL3 (GOWN DISPOSABLE) ×6
GOWN STRL REUS W/TWL XL LVL3 (GOWN DISPOSABLE) IMPLANT
HOLDER KNEE FOAM BLUE (MISCELLANEOUS) ×3 IMPLANT
IMMOBILIZER KNEE 22  40 CIR (ORTHOPEDIC SUPPLIES) ×2
IMMOBILIZER KNEE 22 40 CIR (ORTHOPEDIC SUPPLIES) ×1 IMPLANT
IV NS IRRIG 3000ML ARTHROMATIC (IV SOLUTION) ×6 IMPLANT
KNEE WRAP E Z 3 GEL PACK (MISCELLANEOUS) ×3 IMPLANT
MANIFOLD NEPTUNE II (INSTRUMENTS) ×3 IMPLANT
NEEDLE HYPO 22GX1.5 SAFETY (NEEDLE) IMPLANT
NS IRRIG 1000ML POUR BTL (IV SOLUTION) IMPLANT
PACK ARTHROSCOPY DSU (CUSTOM PROCEDURE TRAY) ×3 IMPLANT
PACK BASIN DAY SURGERY FS (CUSTOM PROCEDURE TRAY) ×3 IMPLANT
PAD CAST 4YDX4 CTTN HI CHSV (CAST SUPPLIES) ×1 IMPLANT
PADDING CAST ABS 4INX4YD NS (CAST SUPPLIES)
PADDING CAST ABS COTTON 4X4 ST (CAST SUPPLIES) IMPLANT
PADDING CAST COTTON 4X4 STRL (CAST SUPPLIES) ×2
PENCIL BUTTON HOLSTER BLD 10FT (ELECTRODE) ×3 IMPLANT
SET ARTHROSCOPY TUBING (MISCELLANEOUS) ×2
SET ARTHROSCOPY TUBING LN (MISCELLANEOUS) ×1 IMPLANT
SPLINT FAST PLASTER 5X30 (CAST SUPPLIES)
SPLINT PLASTER CAST FAST 5X30 (CAST SUPPLIES) IMPLANT
SPONGE GAUZE 4X4 12PLY (GAUZE/BANDAGES/DRESSINGS) ×6 IMPLANT
STAPLER VISISTAT (STAPLE) IMPLANT
STAPLER VISISTAT 35W (STAPLE) IMPLANT
STOCKINETTE 4X48 STRL (DRAPES) IMPLANT
STRIP CLOSURE SKIN 1/2X4 (GAUZE/BANDAGES/DRESSINGS) IMPLANT
SUCTION FRAZIER TIP 10 FR DISP (SUCTIONS) IMPLANT
SUT ETHILON 3 0 PS 1 (SUTURE) ×3 IMPLANT
SUT MNCRL AB 4-0 PS2 18 (SUTURE) ×3 IMPLANT
SUT VIC AB 0 CT1 27 (SUTURE)
SUT VIC AB 0 CT1 27XBRD ANBCTR (SUTURE) IMPLANT
SUT VIC AB 2-0 CT1 27 (SUTURE) ×6
SUT VIC AB 2-0 CT1 TAPERPNT 27 (SUTURE) ×3 IMPLANT
SUT VIC AB 2-0 CT3 27 (SUTURE) ×3 IMPLANT
SUT VIC AB 2-0 SH 27 (SUTURE) ×2
SUT VIC AB 2-0 SH 27XBRD (SUTURE) ×1 IMPLANT
SUT VIC AB 3-0 FS2 27 (SUTURE) IMPLANT
SYR BULB 3OZ (MISCELLANEOUS) IMPLANT
SYR CONTROL 10ML LL (SYRINGE) IMPLANT
TOWEL OR 17X24 6PK STRL BLUE (TOWEL DISPOSABLE) ×3 IMPLANT
TUBE CONNECTING 20'X1/4 (TUBING)
TUBE CONNECTING 20X1/4 (TUBING) IMPLANT
UNDERPAD 30X30 INCONTINENT (UNDERPADS AND DIAPERS) ×3 IMPLANT
WATER STERILE IRR 1000ML POUR (IV SOLUTION) ×3 IMPLANT

## 2013-12-12 NOTE — Discharge Instructions (Signed)
Arthroscopic Procedure/Removal of Hardware Knee, Care After Refer to this sheet in the next few weeks. These discharge instructions provide you with general information on caring for yourself after you leave the hospital. Your health care provider may also give you specific instructions. Your treatment has been planned according to the most current medical practices available, but unavoidable complications sometimes occur. If you have any problems or questions after discharge, please call your health care provider. HOME CARE INSTRUCTIONS   Weight bearing as tolerated but must be in knee immobilizer or using crutches.  May change dressing on Sunday.  May shower on Sunday, but do not let incision get wet.  May apply ice for up to 20 minutes at a time for pain and swelling.  Follow up appointment in our office in one week.    It is normal to be sore for a couple days after surgery. See your health care provider if this seems to be getting worse rather than better.  Only take over-the-counter or prescription medicines for pain, discomfort, or fever as directed by your health care provider.  Take showers rather than baths, or as directed by your health care provider.  Change bandages (dressings) if necessary or as directed.  You may resume normal diet and activities as directed or allowed.  Avoid lifting and driving until you are directed otherwise.  Make an appointment to see your health care provider for stitches (suture) or staple removal as directed.  You may put ice on the area.  Put the ice in a plastic bag. Place a towel between your skin and the bag.  Leave the ice on for 15 20 minutes, three to four times per day for the first 2 days.  Elevate the knee above the level of your heart to reduce swelling, and avoid dangling the leg.  Do 10-15 ankle pumps (pointing your toes toward you and then away from you) two to three times daily.  If you are given compression stockings to wear  after surgery, use them for as long as your surgeon tells you (around 10-14 days).  Avoid smoking and exposure to second-hand smoke. SEEK MEDICAL CARE IF:   You have increased bleeding from your wounds.  You see redness or swelling or you have increasing pain in your wounds.  You have pus coming from your wound.  You have a fever or persistent symptoms for more than 2 3 days.  You notice a bad smell coming from the wound or dressing.  You have severe pain with any motion of your knee. SEEK IMMEDIATE MEDICAL CARE IF:   You develop a rash.  You have difficulty breathing.  You develop any reaction or side effects to medicines taken.  You develop pain in the calves or back of the knee.  You develop chest pain, shortness of breath, or difficulty breathing.  You develop numbness or tingling in the leg or foot. MAKE SURE YOU:   Understand these instructions.  Will watch your condition.  Will get help right away if you are not doing well or you get worse. Document Released: 03/04/2005 Document Revised: 04/17/2013 Document Reviewed: 01/10/2013 [facility] Patient Information 2014 [place], [place].    Regional Anesthesia Blocks  1. Numbness or the inability to move the "blocked" extremity may last from 3-48 hours after placement. The length of time depends on the medication injected and your individual response to the medication. If the numbness is not going away after 48 hours, call your surgeon.  2. The extremity  that is blocked will need to be protected until the numbness is gone and the  Strength has returned. Because you cannot feel it, you will need to take extra care to avoid injury. Because it may be weak, you may have difficulty moving it or using it. You may not know what position it is in without looking at it while the block is in effect.  3. For blocks in the legs and feet, returning to weight bearing and walking needs to be done carefully. You will need to wait until  the numbness is entirely gone and the strength has returned. You should be able to move your leg and foot normally before you try and bear weight or walk. You will need someone to be with you when you first try to ensure you do not fall and possibly risk injury.  4. Bruising and tenderness at the needle site are common side effects and will resolve in a few days.  5. Persistent numbness or new problems with movement should be communicated to the surgeon or the [place] [phone] [place] [phone]).  Post Anesthesia Home Care Instructions  Activity: Get plenty of rest for the remainder of the day. A responsible adult should stay with you for 24 hours following the procedure.  For the next 24 hours, DO NOT: -Drive a car -Paediatric nurse -Drink alcoholic beverages -Take any medication unless instructed by your physician -Make any legal decisions or sign important papers.  Meals: Start with liquid foods such as gelatin or soup. Progress to regular foods as tolerated. Avoid greasy, spicy, heavy foods. If nausea and/or vomiting occur, drink only clear liquids until the nausea and/or vomiting subsides. Call your physician if vomiting continues.  Special Instructions/Symptoms: Your throat may feel dry or sore from the anesthesia or the breathing tube placed in your throat during surgery. If this causes discomfort, gargle with warm salt water. The discomfort should disappear within 24 hours.

## 2013-12-12 NOTE — Anesthesia Preprocedure Evaluation (Signed)
Anesthesia Evaluation  Patient identified by MRN, date of birth, ID band Patient awake    Reviewed: Allergy & Precautions, H&P , NPO status , Patient's Chart, lab work & pertinent test results, reviewed documented beta blocker date and time   History of Anesthesia Complications Negative for: history of anesthetic complications  Airway Mallampati: II TM Distance: >3 FB Neck ROM: full    Dental  (+) Teeth Intact, Caps   Pulmonary neg pulmonary ROS, former smoker,  breath sounds clear to auscultation  Pulmonary exam normal       Cardiovascular Exercise Tolerance: Good negative cardio ROS  Rhythm:regular Rate:Normal     Neuro/Psych Mild low back pain negative psych ROS   GI/Hepatic Neg liver ROS, GERD-  ,Diverticulitis, colon polyps   Endo/Other  Hypothyroidism   Renal/GU negative Renal ROS  Female GU complaint     Musculoskeletal  (+) Arthritis - (left knee), Osteoarthritis,    Abdominal (+) + obese,   Peds  Hematology negative hematology ROS (+)   Anesthesia Other Findings   Reproductive/Obstetrics negative OB ROS                           Anesthesia Physical Anesthesia Plan  ASA: II  Anesthesia Plan: General   Post-op Pain Management:    Induction: Intravenous  Airway Management Planned: LMA  Additional Equipment:   Intra-op Plan:   Post-operative Plan: Extubation in OR  Informed Consent: I have reviewed the patients History and Physical, chart, labs and discussed the procedure including the risks, benefits and alternatives for the proposed anesthesia with the patient or authorized representative who has indicated his/her understanding and acceptance.     Plan Discussed with: CRNA and Surgeon  Anesthesia Plan Comments:         Anesthesia Quick Evaluation

## 2013-12-12 NOTE — Interval H&P Note (Signed)
History and Physical Interval Note:  12/12/2013 7:38 AM  Marilyn Rivas  has presented today for surgery, with the diagnosis of LEFT PATELLA;MECH COMPLIC/INTERNAL ORTH DEVICE,IMPLANT/GRAFT IMS[ECOFOED.CJPMDRP,A;COA [ATE;;A  The various methods of treatment have been discussed with the patient and family. After consideration of risks, benefits and other options for treatment, the patient has consented to  Procedure(s):  LEFT PATELLA HARDWARE REMOVAL (Left) LEFT PATELLA ARTHROSCOPY KNEE WITH DEBRIDEMENT/SHAVING (CONDROPLASTY) (Left) as a surgical intervention .  The patient's history has been reviewed, patient examined, no change in status, stable for surgery.  I have reviewed the patient's chart and labs.  Questions were answered to the patient's satisfaction.     [name]

## 2013-12-12 NOTE — Anesthesia Postprocedure Evaluation (Signed)
  Anesthesia Post-op Note  Patient: Marilyn Rivas  Procedure(s) Performed: Procedure(s):  LEFT PATELLA HARDWARE REMOVAL (Left) LEFT PATELLA ARTHROSCOPY KNEE WITH DEBRIDEMENT/SHAVING (CONDROPLASTY), LYSIS OF ADHESIONS (Left)  Patient Location: PACU  Anesthesia Type:General and block  Level of Consciousness: awake and alert   Airway and Oxygen Therapy: Patient Spontanous Breathing  Post-op Pain: mild  Post-op Assessment: Post-op Vital signs reviewed, Patient's Cardiovascular Status Stable and Respiratory Function Stable  Post-op Vital Signs: Reviewed  Filed Vitals:   12/12/13 1500  BP:   Pulse: 76  Temp:   Resp: 20    Complications: No apparent anesthesia complications

## 2013-12-12 NOTE — Progress Notes (Signed)
Assisted Dr. Chriss Driver with left, ultrasound guided, popliteal, adductor canal block. Side rails up, monitors on throughout procedure. See vital signs in flow sheet. Tolerated Procedure well.

## 2013-12-12 NOTE — Anesthesia Procedure Notes (Addendum)
Anesthesia Regional Block:  Adductor canal block  Pre-Anesthetic Checklist: ,, timeout performed, Correct Patient, Correct Site, Correct Laterality, Correct Procedure, Correct Position, site marked, Risks and benefits discussed,  Surgical consent,  Pre-op evaluation,  At surgeon's request and post-op pain management  Laterality: Left  Prep: chloraprep       Needles:  Injection technique: Single-shot  Needle Type: Echogenic Stimulator Needle     Needle Length: 9cm 9 cm Needle Gauge: 22 and 22 G    Additional Needles:  Procedures: ultrasound guided (picture in chart) and nerve stimulator Adductor canal block  Nerve Stimulator or Paresthesia:  Response: 0.48 mA,   Additional Responses:   Narrative:  Start time: 12/12/2013 11:38 AM End time: 12/12/2013 11:49 AM Injection made incrementally with aspirations every 5 mL. Anesthesiologist: Dr Chriss Driver  Additional Notes: 2505-3976 L Adductor Canal Block POP CHG prep, sterile tech #22 stim/echo needle with [place] visualization-PIX in chart-and stim down to .30ma Multiple neg asp Marc .5% w/epi 1:200000 total 30cc+decadron 4mg  infil No compl Dr Chriss Driver   Procedure Name: LMA Insertion Performed by: Terrance Mass Pre-anesthesia Checklist: Patient identified, Timeout performed, Emergency Drugs available, Suction available and Patient being monitored Patient Re-evaluated:Patient Re-evaluated prior to inductionOxygen Delivery Method: Circle system utilized Preoxygenation: Pre-oxygenation with 100% oxygen Intubation Type: IV induction Ventilation: Mask ventilation without difficulty LMA: LMA inserted LMA Size: 4.0 Tube type: Oral Number of attempts: 1 Placement Confirmation: breath sounds checked- equal and bilateral and positive ETCO2 Tube secured with: Tape Dental Injury: Teeth and Oropharynx as per pre-operative assessment

## 2013-12-12 NOTE — Transfer of Care (Signed)
Immediate Anesthesia Transfer of Care Note  Patient: Marilyn Rivas  Procedure(s) Performed: Procedure(s):  LEFT PATELLA HARDWARE REMOVAL (Left) LEFT PATELLA ARTHROSCOPY KNEE WITH DEBRIDEMENT/SHAVING (CONDROPLASTY), LYSIS OF ADHESIONS (Left)  Patient Location: PACU  Anesthesia Type:General  Level of Consciousness: awake and sedated  Airway & Oxygen Therapy: Patient Spontanous Breathing and Patient connected to face mask oxygen  Post-op Assessment: Report given to PACU RN and Post -op Vital signs reviewed and stable  Post vital signs: Reviewed and stable  Complications: No apparent anesthesia complications

## 2013-12-13 NOTE — Op Note (Signed)
NAMEMarland Rivas  DILLIE, BURANDT NO.:  [id number]  MEDICAL RECORD NO.:  [id number]  LOCATION:                                 FACILITY:  PHYSICIAN:  [name], M.D. DATE OF BIRTH:  Feb 20, 1941  DATE OF PROCEDURE:  12/12/2013 DATE OF DISCHARGE:  12/12/2013                              OPERATIVE REPORT   PREOPERATIVE DIAGNOSES:  Left knee status post open reduction and internal fixation, left patella fracture.  Painful retained hardware including interfragmentary screws and wire.  Posttraumatic chondromalacia of patella.  POSTOPERATIVE DIAGNOSES:  Left knee status post open reduction and internal fixation, left patella fracture.  Painful retained hardware including interfragmentary screws and wire.  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  Intra-articular adhesions.  Healed fracture.  Overgrowth of bone over the retained screws.  PROCEDURES:  Left knee exam under anesthesia.  Open removal of screws and wires from patella.  Arthroscopy with chondroplasty of patella, lysis and debridement of adhesions.  SURGEON:  [name], M.D.  ASSISTANT:  Eula Listen PA, present throughout the entire case and necessary for timely completion of procedure.  ANESTHESIA:  General.  BLOOD LOSS:  Minimal.  SPECIMENS:  None.  CULTURES:  None.  COMPLICATIONS:  None.  DRESSINGS:  Soft compressive.  TOURNIQUET TIME:  45 minutes.  DESCRIPTION OF PROCEDURE:  The patient was brought to the operating room and placed on the operating table in supine position.  After adequate anesthesia had been obtained, tourniquet applied, prepped and draped in usual sterile fashion, exsanguinated with elevation of Esmarch, tourniquet inflated to 350 mmHg.  I used her previous anterior incision. Skin and subcutaneous tissue divided.  Patella exposed.  The wires over the front patella could be  accessed, cut and removed.  The cerclage and FiberWire removed.  I used the inferior wires to isolate the heads of the screws, which were at the distal patella.  These were completely overgrown with bone.  They had been exposed with osteotome and rongeurs. After finally exposed this, I was able to remove the screws and all the hardware.  Fluoroscopy was used to confirm that.  Fracture was well healed.  The split in the tendon distally to access the screws was irrigated and then closed with Vicryl.  Utilizing anterior incision, two portals, medial and lateral parapatellar.  Arthroscope was introduced. Knee was distended and inspected.  Good patellar tracking.  Grade 3 changes, patella trochlea debrided to a stable surface.  Fair amount of intra-articular adhesions removed.  Medial meniscus and medial compartment, lateral meniscus and lateral compartment, cruciate ligaments all looked good.  Instruments and fluid were removed.  Wound was irrigated.  Subcutaneous and subcuticular closure.  Sterile compressive dressing applied.  Tourniquet was deflated and removed. Knee immobilizer applied.  Anesthesia reversed.  Brought to the recovery room.  Tolerated the surgery well.  No complications.     [name], M.D.   ______________________________ [name], M.D.    DFM/MEDQ  D:  12/12/2013  T:  12/13/2013  Job:  [id number]

## 2013-12-16 ENCOUNTER — Encounter (HOSPITAL_BASED_OUTPATIENT_CLINIC_OR_DEPARTMENT_OTHER): Payer: Self-pay | Admitting: Orthopedic Surgery

## 2013-12-16 NOTE — Anesthesia Postprocedure Evaluation (Signed)
  Anesthesia Post-op Note  Patient: Marilyn Rivas  Procedure(s) Performed: Procedure(s):  LEFT PATELLA HARDWARE REMOVAL (Left) LEFT PATELLA ARTHROSCOPY KNEE WITH DEBRIDEMENT/SHAVING (CONDROPLASTY), LYSIS OF ADHESIONS (Left)  Patient Location: PACU  Anesthesia Type:GA combined with regional for post-op pain  Level of Consciousness: awake and alert   Airway and Oxygen Therapy: Patient Spontanous Breathing  Post-op Pain: mild  Post-op Assessment: Post-op Vital signs reviewed, Patient's Cardiovascular Status Stable, Respiratory Function Stable, Patent Airway, No signs of Nausea or vomiting and Pain level controlled  Post-op Vital Signs: Reviewed and stable  Last Vitals:  Filed Vitals:   12/12/13 1530  BP: 151/81  Pulse: 83  Temp: 36.6 C  Resp: 16    Complications: No apparent anesthesia complications

## 2013-12-25 ENCOUNTER — Encounter: Payer: Medicare Other | Admitting: Internal Medicine

## 2013-12-27 DIAGNOSIS — M25569 Pain in unspecified knee: Secondary | ICD-10-CM | POA: Diagnosis not present

## 2013-12-27 DIAGNOSIS — M25669 Stiffness of unspecified knee, not elsewhere classified: Secondary | ICD-10-CM | POA: Diagnosis not present

## 2013-12-27 DIAGNOSIS — M1710 Unilateral primary osteoarthritis, unspecified knee: Secondary | ICD-10-CM | POA: Diagnosis not present

## 2013-12-27 DIAGNOSIS — M2240 Chondromalacia patellae, unspecified knee: Secondary | ICD-10-CM | POA: Diagnosis not present

## 2013-12-30 DIAGNOSIS — M2240 Chondromalacia patellae, unspecified knee: Secondary | ICD-10-CM | POA: Diagnosis not present

## 2013-12-30 DIAGNOSIS — M1710 Unilateral primary osteoarthritis, unspecified knee: Secondary | ICD-10-CM | POA: Diagnosis not present

## 2013-12-30 DIAGNOSIS — M25569 Pain in unspecified knee: Secondary | ICD-10-CM | POA: Diagnosis not present

## 2013-12-30 DIAGNOSIS — M25669 Stiffness of unspecified knee, not elsewhere classified: Secondary | ICD-10-CM | POA: Diagnosis not present

## 2014-01-01 DIAGNOSIS — M1710 Unilateral primary osteoarthritis, unspecified knee: Secondary | ICD-10-CM | POA: Diagnosis not present

## 2014-01-01 DIAGNOSIS — M25669 Stiffness of unspecified knee, not elsewhere classified: Secondary | ICD-10-CM | POA: Diagnosis not present

## 2014-01-01 DIAGNOSIS — M25569 Pain in unspecified knee: Secondary | ICD-10-CM | POA: Diagnosis not present

## 2014-01-01 DIAGNOSIS — M2240 Chondromalacia patellae, unspecified knee: Secondary | ICD-10-CM | POA: Diagnosis not present

## 2014-01-07 DIAGNOSIS — M25569 Pain in unspecified knee: Secondary | ICD-10-CM | POA: Diagnosis not present

## 2014-01-07 DIAGNOSIS — M2240 Chondromalacia patellae, unspecified knee: Secondary | ICD-10-CM | POA: Diagnosis not present

## 2014-01-07 DIAGNOSIS — M25669 Stiffness of unspecified knee, not elsewhere classified: Secondary | ICD-10-CM | POA: Diagnosis not present

## 2014-01-07 DIAGNOSIS — M1710 Unilateral primary osteoarthritis, unspecified knee: Secondary | ICD-10-CM | POA: Diagnosis not present

## 2014-01-09 DIAGNOSIS — M2240 Chondromalacia patellae, unspecified knee: Secondary | ICD-10-CM | POA: Diagnosis not present

## 2014-01-09 DIAGNOSIS — M25569 Pain in unspecified knee: Secondary | ICD-10-CM | POA: Diagnosis not present

## 2014-01-09 DIAGNOSIS — M1710 Unilateral primary osteoarthritis, unspecified knee: Secondary | ICD-10-CM | POA: Diagnosis not present

## 2014-01-09 DIAGNOSIS — M25669 Stiffness of unspecified knee, not elsewhere classified: Secondary | ICD-10-CM | POA: Diagnosis not present

## 2014-01-13 DIAGNOSIS — M2240 Chondromalacia patellae, unspecified knee: Secondary | ICD-10-CM | POA: Diagnosis not present

## 2014-01-13 DIAGNOSIS — M25569 Pain in unspecified knee: Secondary | ICD-10-CM | POA: Diagnosis not present

## 2014-01-13 DIAGNOSIS — M25669 Stiffness of unspecified knee, not elsewhere classified: Secondary | ICD-10-CM | POA: Diagnosis not present

## 2014-01-13 DIAGNOSIS — M1710 Unilateral primary osteoarthritis, unspecified knee: Secondary | ICD-10-CM | POA: Diagnosis not present

## 2014-01-15 DIAGNOSIS — M25569 Pain in unspecified knee: Secondary | ICD-10-CM | POA: Diagnosis not present

## 2014-01-15 DIAGNOSIS — M1710 Unilateral primary osteoarthritis, unspecified knee: Secondary | ICD-10-CM | POA: Diagnosis not present

## 2014-01-15 DIAGNOSIS — M2240 Chondromalacia patellae, unspecified knee: Secondary | ICD-10-CM | POA: Diagnosis not present

## 2014-01-15 DIAGNOSIS — M25669 Stiffness of unspecified knee, not elsewhere classified: Secondary | ICD-10-CM | POA: Diagnosis not present

## 2014-01-21 DIAGNOSIS — L570 Actinic keratosis: Secondary | ICD-10-CM | POA: Diagnosis not present

## 2014-01-21 DIAGNOSIS — C44611 Basal cell carcinoma of skin of unspecified upper limb, including shoulder: Secondary | ICD-10-CM | POA: Diagnosis not present

## 2014-01-21 DIAGNOSIS — M1710 Unilateral primary osteoarthritis, unspecified knee: Secondary | ICD-10-CM | POA: Diagnosis not present

## 2014-01-21 DIAGNOSIS — M2240 Chondromalacia patellae, unspecified knee: Secondary | ICD-10-CM | POA: Diagnosis not present

## 2014-01-21 DIAGNOSIS — L259 Unspecified contact dermatitis, unspecified cause: Secondary | ICD-10-CM | POA: Diagnosis not present

## 2014-01-21 DIAGNOSIS — M25569 Pain in unspecified knee: Secondary | ICD-10-CM | POA: Diagnosis not present

## 2014-01-24 DIAGNOSIS — M2240 Chondromalacia patellae, unspecified knee: Secondary | ICD-10-CM | POA: Diagnosis not present

## 2014-01-24 DIAGNOSIS — M25569 Pain in unspecified knee: Secondary | ICD-10-CM | POA: Diagnosis not present

## 2014-01-24 DIAGNOSIS — M1710 Unilateral primary osteoarthritis, unspecified knee: Secondary | ICD-10-CM | POA: Diagnosis not present

## 2014-01-27 DIAGNOSIS — M2240 Chondromalacia patellae, unspecified knee: Secondary | ICD-10-CM | POA: Diagnosis not present

## 2014-01-27 DIAGNOSIS — M25669 Stiffness of unspecified knee, not elsewhere classified: Secondary | ICD-10-CM | POA: Diagnosis not present

## 2014-01-27 DIAGNOSIS — M25569 Pain in unspecified knee: Secondary | ICD-10-CM | POA: Diagnosis not present

## 2014-01-27 DIAGNOSIS — L900 Lichen sclerosus et atrophicus: Secondary | ICD-10-CM

## 2014-01-27 HISTORY — DX: Lichen sclerosus et atrophicus: L90.0

## 2014-01-31 DIAGNOSIS — M1710 Unilateral primary osteoarthritis, unspecified knee: Secondary | ICD-10-CM | POA: Diagnosis not present

## 2014-01-31 DIAGNOSIS — M2240 Chondromalacia patellae, unspecified knee: Secondary | ICD-10-CM | POA: Diagnosis not present

## 2014-01-31 DIAGNOSIS — M25669 Stiffness of unspecified knee, not elsewhere classified: Secondary | ICD-10-CM | POA: Diagnosis not present

## 2014-01-31 DIAGNOSIS — M25569 Pain in unspecified knee: Secondary | ICD-10-CM | POA: Diagnosis not present

## 2014-02-03 ENCOUNTER — Ambulatory Visit (INDEPENDENT_AMBULATORY_CARE_PROVIDER_SITE_OTHER): Payer: Medicare Other | Admitting: Gynecology

## 2014-02-03 ENCOUNTER — Encounter: Payer: Self-pay | Admitting: Gynecology

## 2014-02-03 DIAGNOSIS — K602 Anal fissure, unspecified: Secondary | ICD-10-CM

## 2014-02-03 DIAGNOSIS — K644 Residual hemorrhoidal skin tags: Secondary | ICD-10-CM

## 2014-02-03 DIAGNOSIS — N899 Noninflammatory disorder of vagina, unspecified: Secondary | ICD-10-CM

## 2014-02-03 DIAGNOSIS — N898 Other specified noninflammatory disorders of vagina: Secondary | ICD-10-CM

## 2014-02-03 DIAGNOSIS — L940 Localized scleroderma [morphea]: Secondary | ICD-10-CM | POA: Diagnosis not present

## 2014-02-03 DIAGNOSIS — R82998 Other abnormal findings in urine: Secondary | ICD-10-CM | POA: Diagnosis not present

## 2014-02-03 LAB — WET PREP FOR TRICH, YEAST, CLUE
TRICH WET PREP: NONE SEEN
Yeast Wet Prep HPF POC: NONE SEEN

## 2014-02-03 MED ORDER — METRONIDAZOLE 500 MG PO TABS: 500.0000 mg | ORAL_TABLET | Freq: Two times a day (BID) | ORAL | Status: DC

## 2014-02-03 NOTE — Patient Instructions (Signed)
Start Flagyl medication twice daily for 7 days. Office will call you with the biopsy results.

## 2014-02-03 NOTE — Progress Notes (Signed)
Marilyn Rivas March 29, 1941 [id number]        72 y.o.  presents with a several month history of vulvar/perianal itching. No significant discharge or odor. Comes and goes. Occasional loss of urine although not overly bothersome. Also some perirectal bleeding with wiping noticed times one episode.  Past medical history,surgical history, problem list, medications, allergies, family history and social history were all reviewed and documented in the EPIC chart.  Directed ROS with pertinent positives and negatives documented in the history of present illness/assessment and plan.  Exam: Kim assistant General appearance  Normal External BUS vagina with symmetrical vitiligo changes from the periclitoral hood to the posterior fourchette/perineal body. Consistent with lichen sclerosus. No specific lesions. Vagina atrophic without significant discharge. Cervix atrophic. Uterus grossly normal midline mobile. Adnexa without masses or tenderness. Rectum with several old external hemorrhoids and a small fissure with slight bleeding 9:00 anal verge.  Procedure: Mid to lower left labia majora skin with classic white blanching cleansed with Betadine, infiltrated with 1% lidocaine and a representative biopsy was taken. Silver nitrate hemostasis applied afterwards. Postoperative care instructions given.   Assessment/Plan:  73 y.o. . With several month history of perineal itching. Exam consistent with lichen sclerosus. Biopsy taken as above. Patient will followup for results. Possible high dose steroid treatment reviewed. Wet prep did show changes consistent with bacterial vaginosis. I will cover her with Flagyl 500 mg twice a day x7 days. Also check urinalysis. Lastly patient does have a small anal fissure which accounts for her bleeding. It does not appear to be overtly bleeding now and will plan OTC hemorrhoid products for now. More aggressive treatment if persists or recurs.  Note: This document was prepared with  digital dictation and possible smart phrase technology. Any transcriptional errors that result from this process are unintentional.   Anastasio Auerbach MD, 4:10 PM 02/03/2014

## 2014-02-04 DIAGNOSIS — M25669 Stiffness of unspecified knee, not elsewhere classified: Secondary | ICD-10-CM | POA: Diagnosis not present

## 2014-02-04 DIAGNOSIS — M25569 Pain in unspecified knee: Secondary | ICD-10-CM | POA: Diagnosis not present

## 2014-02-04 DIAGNOSIS — M1710 Unilateral primary osteoarthritis, unspecified knee: Secondary | ICD-10-CM | POA: Diagnosis not present

## 2014-02-04 DIAGNOSIS — M2240 Chondromalacia patellae, unspecified knee: Secondary | ICD-10-CM | POA: Diagnosis not present

## 2014-02-04 LAB — URINALYSIS W MICROSCOPIC + REFLEX CULTURE
Bilirubin Urine: NEGATIVE
GLUCOSE, UA: NEGATIVE mg/dL
Hgb urine dipstick: NEGATIVE
Nitrite: NEGATIVE
PROTEIN: NEGATIVE mg/dL
SPECIFIC GRAVITY, URINE: 1.024 (ref 1.005–1.030)
Urobilinogen, UA: 0.2 mg/dL (ref 0.0–1.0)
pH: 6.5 (ref 5.0–8.0)

## 2014-02-05 ENCOUNTER — Other Ambulatory Visit: Payer: Self-pay

## 2014-02-05 ENCOUNTER — Encounter: Payer: Self-pay | Admitting: Gynecology

## 2014-02-05 ENCOUNTER — Other Ambulatory Visit: Payer: Self-pay | Admitting: Gynecology

## 2014-02-05 MED ORDER — CLOBETASOL PROPIONATE 0.05 % EX CREA: TOPICAL_CREAM | CUTANEOUS | Status: DC

## 2014-02-05 MED ORDER — SULFAMETHOXAZOLE-TMP DS 800-160 MG PO TABS: 1.0000 | ORAL_TABLET | Freq: Two times a day (BID) | ORAL | Status: DC

## 2014-02-06 DIAGNOSIS — M25569 Pain in unspecified knee: Secondary | ICD-10-CM | POA: Diagnosis not present

## 2014-02-06 DIAGNOSIS — M1710 Unilateral primary osteoarthritis, unspecified knee: Secondary | ICD-10-CM | POA: Diagnosis not present

## 2014-02-06 DIAGNOSIS — M2240 Chondromalacia patellae, unspecified knee: Secondary | ICD-10-CM | POA: Diagnosis not present

## 2014-02-06 DIAGNOSIS — M25669 Stiffness of unspecified knee, not elsewhere classified: Secondary | ICD-10-CM | POA: Diagnosis not present

## 2014-02-06 LAB — URINE CULTURE: Colony Count: 70000

## 2014-02-07 ENCOUNTER — Telehealth: Payer: Self-pay | Admitting: *Deleted

## 2014-02-07 NOTE — Telephone Encounter (Signed)
Pt called asking if labs could be done here such as TSH, lipid profile etc. I informed that labs would not be covered here. Pt will see PCP

## 2014-02-11 DIAGNOSIS — M1710 Unilateral primary osteoarthritis, unspecified knee: Secondary | ICD-10-CM | POA: Diagnosis not present

## 2014-02-11 DIAGNOSIS — M25669 Stiffness of unspecified knee, not elsewhere classified: Secondary | ICD-10-CM | POA: Diagnosis not present

## 2014-02-11 DIAGNOSIS — M25569 Pain in unspecified knee: Secondary | ICD-10-CM | POA: Diagnosis not present

## 2014-02-11 DIAGNOSIS — M2240 Chondromalacia patellae, unspecified knee: Secondary | ICD-10-CM | POA: Diagnosis not present

## 2014-02-12 ENCOUNTER — Other Ambulatory Visit: Payer: Self-pay | Admitting: Dermatology

## 2014-02-12 DIAGNOSIS — D239 Other benign neoplasm of skin, unspecified: Secondary | ICD-10-CM | POA: Diagnosis not present

## 2014-02-12 DIAGNOSIS — D485 Neoplasm of uncertain behavior of skin: Secondary | ICD-10-CM | POA: Diagnosis not present

## 2014-02-12 DIAGNOSIS — Z85828 Personal history of other malignant neoplasm of skin: Secondary | ICD-10-CM | POA: Diagnosis not present

## 2014-02-12 DIAGNOSIS — L821 Other seborrheic keratosis: Secondary | ICD-10-CM | POA: Diagnosis not present

## 2014-02-12 DIAGNOSIS — I789 Disease of capillaries, unspecified: Secondary | ICD-10-CM | POA: Diagnosis not present

## 2014-02-12 DIAGNOSIS — I781 Nevus, non-neoplastic: Secondary | ICD-10-CM | POA: Diagnosis not present

## 2014-02-12 DIAGNOSIS — L570 Actinic keratosis: Secondary | ICD-10-CM | POA: Diagnosis not present

## 2014-02-13 DIAGNOSIS — M2240 Chondromalacia patellae, unspecified knee: Secondary | ICD-10-CM | POA: Diagnosis not present

## 2014-02-13 DIAGNOSIS — M1710 Unilateral primary osteoarthritis, unspecified knee: Secondary | ICD-10-CM | POA: Diagnosis not present

## 2014-02-13 DIAGNOSIS — M25669 Stiffness of unspecified knee, not elsewhere classified: Secondary | ICD-10-CM | POA: Diagnosis not present

## 2014-02-13 DIAGNOSIS — H04129 Dry eye syndrome of unspecified lacrimal gland: Secondary | ICD-10-CM | POA: Diagnosis not present

## 2014-02-13 DIAGNOSIS — M25569 Pain in unspecified knee: Secondary | ICD-10-CM | POA: Diagnosis not present

## 2014-02-17 ENCOUNTER — Ambulatory Visit (INDEPENDENT_AMBULATORY_CARE_PROVIDER_SITE_OTHER): Payer: Medicare Other | Admitting: Gynecology

## 2014-02-17 ENCOUNTER — Encounter: Payer: Self-pay | Admitting: Gynecology

## 2014-02-17 VITALS — BP 130/80 | HR 60

## 2014-02-17 DIAGNOSIS — B9689 Other specified bacterial agents as the cause of diseases classified elsewhere: Secondary | ICD-10-CM | POA: Diagnosis not present

## 2014-02-17 DIAGNOSIS — R5383 Other fatigue: Secondary | ICD-10-CM | POA: Diagnosis not present

## 2014-02-17 DIAGNOSIS — M545 Low back pain, unspecified: Secondary | ICD-10-CM

## 2014-02-17 DIAGNOSIS — N760 Acute vaginitis: Secondary | ICD-10-CM

## 2014-02-17 DIAGNOSIS — R5381 Other malaise: Secondary | ICD-10-CM

## 2014-02-17 DIAGNOSIS — A499 Bacterial infection, unspecified: Secondary | ICD-10-CM | POA: Diagnosis not present

## 2014-02-17 LAB — CBC WITH DIFFERENTIAL/PLATELET
Basophils Absolute: 0.1 10*3/uL (ref 0.0–0.1)
Basophils Relative: 1 % (ref 0–1)
Eosinophils Absolute: 0.2 10*3/uL (ref 0.0–0.7)
Eosinophils Relative: 3 % (ref 0–5)
HCT: 43.5 % (ref 36.0–46.0)
HEMOGLOBIN: 15.2 g/dL — AB (ref 12.0–15.0)
LYMPHS ABS: 2.5 10*3/uL (ref 0.7–4.0)
Lymphocytes Relative: 30 % (ref 12–46)
MCH: 30.8 pg (ref 26.0–34.0)
MCHC: 34.9 g/dL (ref 30.0–36.0)
MCV: 88.2 fL (ref 78.0–100.0)
MONOS PCT: 8 % (ref 3–12)
Monocytes Absolute: 0.7 10*3/uL (ref 0.1–1.0)
NEUTROS ABS: 4.8 10*3/uL (ref 1.7–7.7)
NEUTROS PCT: 58 % (ref 43–77)
Platelets: 301 10*3/uL (ref 150–400)
RBC: 4.93 MIL/uL (ref 3.87–5.11)
RDW: 13.8 % (ref 11.5–15.5)
WBC: 8.3 10*3/uL (ref 4.0–10.5)

## 2014-02-17 LAB — URINALYSIS W MICROSCOPIC + REFLEX CULTURE
Bilirubin Urine: NEGATIVE
GLUCOSE, UA: NEGATIVE mg/dL
Hgb urine dipstick: NEGATIVE
LEUKOCYTES UA: NEGATIVE
NITRITE: NEGATIVE
PH: 7 (ref 5.0–8.0)
Protein, ur: NEGATIVE mg/dL
SPECIFIC GRAVITY, URINE: 1.02 (ref 1.005–1.030)
Urobilinogen, UA: 0.2 mg/dL (ref 0.0–1.0)

## 2014-02-17 LAB — COMPREHENSIVE METABOLIC PANEL
ALK PHOS: 75 U/L (ref 39–117)
ALT: 36 U/L — ABNORMAL HIGH (ref 0–35)
AST: 27 U/L (ref 0–37)
Albumin: 4.3 g/dL (ref 3.5–5.2)
BUN: 13 mg/dL (ref 6–23)
CO2: 29 meq/L (ref 19–32)
Calcium: 10.1 mg/dL (ref 8.4–10.5)
Chloride: 103 mEq/L (ref 96–112)
Creat: 0.69 mg/dL (ref 0.50–1.10)
GLUCOSE: 77 mg/dL (ref 70–99)
POTASSIUM: 5.1 meq/L (ref 3.5–5.3)
Sodium: 140 mEq/L (ref 135–145)
TOTAL PROTEIN: 6.3 g/dL (ref 6.0–8.3)
Total Bilirubin: 0.5 mg/dL (ref 0.2–1.2)

## 2014-02-17 LAB — WET PREP FOR TRICH, YEAST, CLUE
Clue Cells Wet Prep HPF POC: NONE SEEN
TRICH WET PREP: NONE SEEN
Yeast Wet Prep HPF POC: NONE SEEN

## 2014-02-17 NOTE — Patient Instructions (Signed)
Office will call you if there are any abnormalities with the lab work. If your symptoms continue recommend followup with your primary physician for further evaluation.

## 2014-02-17 NOTE — Addendum Note (Signed)
Addended by: Nelva Nay on: 02/17/2014 03:30 PM   Modules accepted: Orders

## 2014-02-17 NOTE — Progress Notes (Signed)
Marilyn Rivas 1940/10/10 [id number]        73 y.o.  presents complaining of several days of fatigue and low back pain. Also requests a test of cure for her recent bacterial vaginosis treatment. Also treated for UTI. Had diarrhea following these treatments but this has resolved. No nausea vomiting diarrhea constipation. No frequency dysuria or urgency. No fever chills. No chest pain, respiratory complaints such as shortness of breath cough or sputum. No lightheadedness or dizziness. No swelling or abdominal bloating. No peripheral edema.  Past medical history,surgical history, problem list, medications, allergies, family history and social history were all reviewed and documented as reviewed in the EPIC chart.  ROS:  12 system ROS performed with pertinent positives and negatives included in the history, assessment and plan.  Included Systems: General, HEENT, Neck, Cardiovascular, Pulmonary, Gastrointestinal, Genitourinary, Musculoskeletal, Dermatologic, Endocrine, Hematological, Neurologic, Psychiatric Additional significant findings :  None   Exam: Kim assistant Pulse 60 blood pressure 130/80 General appearance:  Normal affect, orientation and appearance in no apparent distress. Skin: Grossly normal HEENT: Without gross lesions.  No cervical or supraclavicular adenopathy. Thyroid normal.  Lungs:  Clear without wheezing, rales or rhonchi Cardiac: RR, without RMG Abdominal:  Soft, nontender, without masses, guarding, rebound, organomegaly or hernia Pelvic:  Ext/BUS/vagina with generalized atrophic changes  Cervix atrophic  Uterus small, difficult to palpate. Nontender  Adnexa  Without gross masses or tenderness    Anus and perineum  Normal  Extremities without edema  Assessment/Plan:  73 y.o. with history of recent UTI treatment and bacterial vaginosis treatment. Exam shows no evidence of vaginal discharge or irritation. Wet prep and urinalysis are both negative. Complaining of several  days of fatigue without other localizing symptoms. Exam is normal without overt cardiac or respiratory etiology. Will check baseline CBC comprehensive metabolic panel thyroid panel. Assuming negative patient will monitor and if the symptoms continue over the next several days or certainly any other symptoms present themselves I have recommended ASAP evaluation by her primary physician. Etiologies such as cardiac/neurologic/other metabolic issues reviewed. Need for further studies such as EKG also reviewed.  Note: This document was prepared with digital dictation and possible smart phrase technology. Any transcriptional errors that result from this process are unintentional.   Anastasio Auerbach MD, 12:31 PM 02/17/2014

## 2014-02-18 ENCOUNTER — Other Ambulatory Visit: Payer: Self-pay | Admitting: Gynecology

## 2014-02-18 DIAGNOSIS — M25569 Pain in unspecified knee: Secondary | ICD-10-CM | POA: Diagnosis not present

## 2014-02-18 DIAGNOSIS — R946 Abnormal results of thyroid function studies: Secondary | ICD-10-CM

## 2014-02-18 DIAGNOSIS — M1710 Unilateral primary osteoarthritis, unspecified knee: Secondary | ICD-10-CM | POA: Diagnosis not present

## 2014-02-18 DIAGNOSIS — M2240 Chondromalacia patellae, unspecified knee: Secondary | ICD-10-CM | POA: Diagnosis not present

## 2014-02-18 DIAGNOSIS — M25669 Stiffness of unspecified knee, not elsewhere classified: Secondary | ICD-10-CM | POA: Diagnosis not present

## 2014-02-18 LAB — THYROID PANEL
Free T4: 1.63 ng/dL (ref 0.80–1.80)
T3 UPTAKE: 38.9 % — AB (ref 22.5–37.0)
T4 TOTAL: 13.5 ug/dL — AB (ref 5.0–12.5)
TSH: 0.559 u[IU]/mL (ref 0.350–4.500)

## 2014-02-19 ENCOUNTER — Encounter: Payer: Self-pay | Admitting: Internal Medicine

## 2014-02-19 ENCOUNTER — Ambulatory Visit (INDEPENDENT_AMBULATORY_CARE_PROVIDER_SITE_OTHER): Payer: Medicare Other | Admitting: Internal Medicine

## 2014-02-19 ENCOUNTER — Ambulatory Visit (HOSPITAL_BASED_OUTPATIENT_CLINIC_OR_DEPARTMENT_OTHER)
Admission: RE | Admit: 2014-02-19 | Discharge: 2014-02-19 | Disposition: A | Discharge status: Home / Self Care | Payer: Medicare Other | Source: Ambulatory Visit | Attending: Internal Medicine | Admitting: Internal Medicine

## 2014-02-19 VITALS — BP 120/82 | HR 52 | Resp 16 | Ht 61.0 in | Wt 164.0 lb

## 2014-02-19 DIAGNOSIS — J984 Other disorders of lung: Secondary | ICD-10-CM | POA: Insufficient documentation

## 2014-02-19 DIAGNOSIS — E785 Hyperlipidemia, unspecified: Secondary | ICD-10-CM | POA: Diagnosis not present

## 2014-02-19 DIAGNOSIS — R0602 Shortness of breath: Secondary | ICD-10-CM

## 2014-02-19 DIAGNOSIS — Z87891 Personal history of nicotine dependence: Secondary | ICD-10-CM | POA: Insufficient documentation

## 2014-02-19 LAB — COMPREHENSIVE METABOLIC PANEL
ALT: 30 U/L (ref 0–35)
AST: 25 U/L (ref 0–37)
Albumin: 4.7 g/dL (ref 3.5–5.2)
Alkaline Phosphatase: 63 U/L (ref 39–117)
BUN: 12 mg/dL (ref 6–23)
CHLORIDE: 102 meq/L (ref 96–112)
CO2: 29 mEq/L (ref 19–32)
Calcium: 9.9 mg/dL (ref 8.4–10.5)
Creat: 0.64 mg/dL (ref 0.50–1.10)
Glucose, Bld: 86 mg/dL (ref 70–99)
Potassium: 4.1 mEq/L (ref 3.5–5.3)
Sodium: 140 mEq/L (ref 135–145)
Total Bilirubin: 0.8 mg/dL (ref 0.2–1.2)
Total Protein: 6.8 g/dL (ref 6.0–8.3)

## 2014-02-19 LAB — LIPID PANEL
CHOLESTEROL: 281 mg/dL — AB (ref 0–200)
HDL: 52 mg/dL (ref >39)
LDL Cholesterol: 181 mg/dL — ABNORMAL HIGH (ref 0–99)
TRIGLYCERIDES: 240 mg/dL — AB (ref <150)
Total CHOL/HDL Ratio: 5.4 Ratio
VLDL: 48 mg/dL — AB (ref 0–40)

## 2014-02-19 NOTE — Progress Notes (Signed)
Subjective:    Patient ID: Marilyn Rivas, female    DOB: 28-Sep-1940, 73 y.o.   MRN: [id number]  HPI  Marilyn Rivas is here for acute visit.   She reports two episodes of SOB once while making bed and once while climbing stairs while doing PT.  She had Left knee surgery where "hardware was removed" back in April.  No edema of either leg  Episodes lasted just a few minutes.  No chest pain, no pressure, no palpitations  When SOB.  No PND   She has never had cardiac stress testing.   She has a longstanding history of untreated hyperlipidemia   Allergies  Allergen Reactions  . Aspirin     REACTION: severe abdominal pain and excessive salavation Only takes Aleve  . Atorvastatin Other (See Comments)    myalgia  . Zofran [Ondansetron Hcl]     Headache   . Adhesive [Tape] Rash    Looks burned  . Clarithromycin Rash    Says allergic to "mycins"  . Penicillins Swelling and Rash    Hands swelling   Past Medical History  Diagnosis Date  . Thyroid disease   . Hyperlipidemia   . Menopause   . Osteopenia   . Diverticulosis   . Diverticulitis   . Hx of adenomatous colonic polyps   . Seasonal allergies   . Cataracts, both eyes   . GERD (gastroesophageal reflux disease)     HISTORY  . Arthritis   . Lichen sclerosus 09/7515    Biopsy proven   Past Surgical History  Procedure Laterality Date  . Appendectomy  1977  . Right oophorectomy  1977  . Patella reconstruction  1994    left  . Cervical fusion  1999    C5-7  . Lumbar disc surgery  2004    L5  . Cataract surgery Bilateral 06/2012  . Dilatation & curettage/hysteroscopy with trueclear N/A 05/02/2013    Procedure: DILATATION & CURETTAGE/HYSTEROSCOPY WITH TRUECLEAR ;  Surgeon: Anastasio Auerbach, MD;  Location: [facility];  Service: Gynecology;  Laterality: N/A;  . Hysteroscopy    . Dilation and curettage of uterus    . Hardware removal Left 12/12/2013    Procedure:  LEFT PATELLA HARDWARE REMOVAL;  Surgeon: [name], MD;   Location: [place];  Service: Orthopedics;  Laterality: Left;  . Knee arthroscopy Left 12/12/2013    Procedure: LEFT PATELLA ARTHROSCOPY KNEE WITH DEBRIDEMENT/SHAVING (CONDROPLASTY), LYSIS OF ADHESIONS;  Surgeon: [name], MD;  Location: [place];  Service: Orthopedics;  Laterality: Left;   History   Social History  . Marital Status: Single    Spouse Name: N/A    Number of Children: 2  . Years of Education: N/A   Occupational History  . RESEARCH ASSIST     Retired    Social History Main Topics  . Smoking status: Former Smoker    Quit date: 12/10/1983  . Smokeless tobacco: Never Used  . Alcohol Use: Yes     Comment: OCC  . Drug Use: No  . Sexual Activity: No   Other Topics Concern  . Not on file   Social History Narrative   Daily caffeine    Family History  Problem Relation Age of Onset  . Allergies Mother   . Heart disease Father   . Colon cancer Neg Hx    Patient Active Problem List   Diagnosis Date Noted  . Abdominal pain, left lower quadrant 05/20/2013  .  Nausea alone 03/06/2013  . LLQ pain 03/05/2013  . Hyperglycemia 10/29/2012  . Urge incontinence of urine 10/29/2012  . Osteoporosis 05/13/2012  . Basal cell cancer 12/17/2011  . Sinus bradycardia by electrocardiogram 11/15/2011  . Hypothyroidism 09/07/2011  . DJD (degenerative joint disease) 09/07/2011  . Atrophic vaginitis 09/07/2011  . Radiculopathy of lumbar region 09/07/2011  . GERD 09/08/2010  . DIVERTICULITIS, COLON 09/08/2010  . CONSTIPATION 10/09/2009  . PERSONAL HX COLONIC POLYPS 10/09/2009  . HYPERLIPIDEMIA-MIXED 04/10/2009   Current Outpatient Prescriptions on File Prior to Visit  Medication Sig Dispense Refill  . Calcium Citrate-Vitamin D (CALCIUM CITRATE + PO) Take 4 capsules by mouth daily. Contain 800mg  calcium citrate and other vitamins D,C, K, Mg, zinc, copper      . Cholecalciferol (VITAMIN D3) 5000 UNITS TABS Take 1 tablet by mouth daily.        . clobetasol cream (TEMOVATE) 0.05 % S: Apply thin film externally to affected areas at bedtime for 2-4 weeks. Then taper every other to every third night for 2 more weeks and then stop it. Use prn as discussed after that.  30 g  4  . hyoscyamine (LEVSIN, ANASPAZ) 0.125 MG tablet Take 0.125 mg by mouth every 4 (four) hours as needed for cramping.      Marland Kitchen levothyroxine (SYNTHROID, LEVOTHROID) 112 MCG tablet Take 1 tablet (112 mcg total) by mouth daily.  90 tablet  1  . omeprazole (PRILOSEC OTC) 20 MG tablet Take 20 mg by mouth daily.      . Probiotic Product (PROBIOTIC PO) Take 1-2 capsules by mouth daily.        No current facility-administered medications on file prior to visit.      Review of Systems    see HPI Objective:   Physical Exam  Physical Exam  Nursing note and vitals reviewed.  Constitutional: She is oriented to person, place, and time. She appears well-developed and well-nourished.  HENT:  Head: Normocephalic and atraumatic.  Cardiovascular: Normal rate and regular rhythm. Exam reveals no gallop and no friction rub.  No murmur heard.  Pulmonary/Chest: Breath sounds normal. She has no wheezes. She has no rales.  Neurological: She is alert and oriented to person, place, and time.  Skin: Skin is warm and dry.  Psychiatric: She has a normal mood and affect. Her behavior is normal.          Assessment & Plan:  DOE:    EKG today no changes from prior EKG but TWI laterally and LVH.  Will get CXR and refer to cardiology for further work up.  Hyperlipidemia  Has had problems with statins in the past.  Will  Recheck today and if still high will discuss trying statin 3 times per week  Hypothyroid   TSH normal with normal freee T4 she would like to see a new endocrinologist  Will refer  Knee surgery 3 months ago.  No signs of DVT  This is less likely cause of DOE    Pt to call office in am for CXR results

## 2014-02-19 NOTE — Patient Instructions (Signed)
Will set up appt with [facility] cardiology   Set up referral to Dr. Cruzita Lederer    To xray and lab today

## 2014-02-19 NOTE — Addendum Note (Signed)
Addended by: Gretchen Short on: 02/19/2014 10:00 AM   Modules accepted: Orders

## 2014-02-20 DIAGNOSIS — M2240 Chondromalacia patellae, unspecified knee: Secondary | ICD-10-CM | POA: Diagnosis not present

## 2014-02-20 DIAGNOSIS — M25669 Stiffness of unspecified knee, not elsewhere classified: Secondary | ICD-10-CM | POA: Diagnosis not present

## 2014-02-20 DIAGNOSIS — M1710 Unilateral primary osteoarthritis, unspecified knee: Secondary | ICD-10-CM | POA: Diagnosis not present

## 2014-02-20 DIAGNOSIS — M25569 Pain in unspecified knee: Secondary | ICD-10-CM | POA: Diagnosis not present

## 2014-02-24 ENCOUNTER — Telehealth: Payer: Self-pay | Admitting: Internal Medicine

## 2014-02-24 NOTE — Telephone Encounter (Signed)
Spoke with Marilyn Rivas and informed of CXR result with scarring in right lung.  She has had pneumonia x2 in the past  Also informed of severe hyperlipidemia.  She had been on Crestor twice a week by Dr. Elyse Hsu but she wishes to see a new endocrinologist.   Has appt with Dr. Cruzita Lederer  No further episodes of SOB no chest pain or epigastric symptoms.  Advised if any recureent SOB or chest pain to go to ER  .  Marilyn Rivas voices understanding

## 2014-02-25 DIAGNOSIS — M1710 Unilateral primary osteoarthritis, unspecified knee: Secondary | ICD-10-CM | POA: Diagnosis not present

## 2014-02-25 DIAGNOSIS — M25669 Stiffness of unspecified knee, not elsewhere classified: Secondary | ICD-10-CM | POA: Diagnosis not present

## 2014-02-25 DIAGNOSIS — M2240 Chondromalacia patellae, unspecified knee: Secondary | ICD-10-CM | POA: Diagnosis not present

## 2014-02-25 DIAGNOSIS — M25569 Pain in unspecified knee: Secondary | ICD-10-CM | POA: Diagnosis not present

## 2014-03-06 ENCOUNTER — Encounter: Payer: Self-pay | Admitting: Internal Medicine

## 2014-03-06 ENCOUNTER — Ambulatory Visit (INDEPENDENT_AMBULATORY_CARE_PROVIDER_SITE_OTHER): Payer: Medicare Other | Admitting: Internal Medicine

## 2014-03-06 VITALS — BP 104/62 | HR 78 | Temp 98.5°F | Resp 12 | Ht 60.75 in | Wt 168.0 lb

## 2014-03-06 DIAGNOSIS — E785 Hyperlipidemia, unspecified: Secondary | ICD-10-CM

## 2014-03-06 MED ORDER — ROSUVASTATIN CALCIUM 5 MG PO TABS: 5.0000 mg | ORAL_TABLET | Freq: Every day | ORAL | Status: DC

## 2014-03-06 NOTE — Patient Instructions (Signed)
Please start Crestor 5 mg every 7, then 6, then 5 days, etc., until you get to take it every day. You can start taking the CoQ10.  Please come back in 6 weeks for labs - fasting.  Please come back for a follow-up appointment in 3 months.

## 2014-03-06 NOTE — Progress Notes (Signed)
Patient ID: Marilyn Rivas, female   DOB: 01/31/1941, 73 y.o.   MRN: [id number]   HPI  Marilyn Rivas is a 73 y.o.-year-old female, referred by her PCP, Dr. Coralyn Mark, for management of hyperlipidemia.  Pt. has been dx with hyperlipidemia in 1980s.  I reviewed pt's recent lipid panels  - last test was not fasting:  Component     Latest Ref Rng 10/18/2011 11/05/2012 02/19/2014  Cholesterol     0 - 200 mg/dL 279 (H) 237 (H) 281 (H)  Triglycerides     <150 mg/dL 166 (H) 196 (H) 240 (H)  HDL     >39 mg/dL 48 50 52  Total CHOL/HDL Ratio      5.8 4.7 5.4  VLDL     0 - 40 mg/dL 33 39 48 (H)  LDL (calc)     0 - 99 mg/dL 198 (H) 148 (H) 181 (H)   She has tried the following meds: - Questran initially >> TG increased >> stopped - Lipitor >> 10 mg daily (Dr Verl Blalock) mm pain >> stopped  - Crestor >> 5 mg 2x a week (Dr Altheimer) >> stopped 8 mo ago, however, she tolerated this well  She has taken Coenzyme Q - when taking Crestor.   Meals: - Breakfast: fruit, toast, sometimes omelet - Lunch: salad, leftovers, fruit - Dinner: veggies, rice, rice pasta - Snacks: 1 Exercises 3x a week; Aquatic exercises  She had occasional high LFTs, now normal: Component     Latest Ref Rng 02/13/2013 04/23/2013 02/17/2014 02/19/2014  Total Protein     6.0 - 8.3 g/dL 7.0 6.5 6.3 6.8  Albumin     3.5 - 5.2 g/dL 4.2 4.0 4.3 4.7  AST     0 - 37 U/L 25 35 27 25  ALT     0 - 35 U/L 28 41 (H) 36 (H) 30  Alkaline Phosphatase     39 - 117 U/L 80 87 75 63  Total Bilirubin     0.2 - 1.2 mg/dL 0.6 0.6 0.5 0.8   No h/o vitamin D deficiency. She is taking a vit D supplement. Last level was normal in 2013: Component     Latest Ref Rng 10/18/2011  Vit D, 25-Hydroxy     30 - 89 ng/mL 50   She has a FH h/o heart disease - father: 1st MI in late 68s). She has a daughter with a pacemaker at 54 y/o.  Hypothyroidism: Pt is on Levothyroxine 112 mcg daily.   She takes the LT4: - fasting - with water -  eats b'fast 30 min later - takes calcium with b'fast!  Her TFTs are controlled: Lab Results  Component Value Date   TSH 0.559 02/17/2014   TSH 1.494 11/05/2012   TSH 10.146* 10/18/2011   TSH 0.84 08/01/2007   TSH 2.91 01/29/2007   FREET4 1.63 02/17/2014    Pt describes: - fatigue - weight gain She believes that these sxs are caused by unregulated thyroid fxn.  ROS: Constitutional: + weight gain, + fatigue, no subjective hyperthermia/hypothermia Eyes: no blurry vision, no xerophthalmia ENT: no sore throat, no nodules palpated in throat, no dysphagia/odynophagia, no hoarseness Cardiovascular: no CP/+ SOB/no palpitations/leg swelling Respiratory: no cough/+ SOB Gastrointestinal: no N/V/D/C Musculoskeletal: no muscle/+ joint aches Skin: no rashes Neurological: no tremors/numbness/tingling/dizziness Psychiatric: no depression/anxiety  Past Medical History  Diagnosis Date  . Thyroid disease   . Hyperlipidemia   . Menopause   . Osteopenia   . Diverticulosis   .  Diverticulitis   . Hx of adenomatous colonic polyps   . Seasonal allergies   . Cataracts, both eyes   . GERD (gastroesophageal reflux disease)     HISTORY  . Arthritis   . Lichen sclerosus 10/9765    Biopsy proven   Past Surgical History  Procedure Laterality Date  . Appendectomy  1977  . Right oophorectomy  1977  . Patella reconstruction  1994    left  . Cervical fusion  1999    C5-7  . Lumbar disc surgery  2004    L5  . Cataract surgery Bilateral 06/2012  . Dilatation & curettage/hysteroscopy with trueclear N/A 05/02/2013    Procedure: DILATATION & CURETTAGE/HYSTEROSCOPY WITH TRUECLEAR ;  Surgeon: Anastasio Auerbach, MD;  Location: [facility];  Service: Gynecology;  Laterality: N/A;  . Hysteroscopy    . Dilation and curettage of uterus    . Hardware removal Left 12/12/2013    Procedure:  LEFT PATELLA HARDWARE REMOVAL;  Surgeon: [name], MD;  Location: [place];  Service: Orthopedics;   Laterality: Left;  . Knee arthroscopy Left 12/12/2013    Procedure: LEFT PATELLA ARTHROSCOPY KNEE WITH DEBRIDEMENT/SHAVING (CONDROPLASTY), LYSIS OF ADHESIONS;  Surgeon: [name], MD;  Location: [place];  Service: Orthopedics;  Laterality: Left;   History   Social History  . Marital Status: Single    Spouse Name: N/A    Number of Children: 2   Occupational History  . RESEARCH ASSIST     Retired    Social History Main Topics  . Smoking status: Former Smoker    Quit date: 12/10/1983  . Smokeless tobacco: Never Used  . Alcohol Use: Yes     Comment: OCC - wine, 2x a mo  . Drug Use: No   Social History Narrative   Daily caffeine    Current Outpatient Prescriptions on File Prior to Visit  Medication Sig Dispense Refill  . Calcium Citrate-Vitamin D (CALCIUM CITRATE + PO) Take 4 capsules by mouth daily. Contain 800mg  calcium citrate and other vitamins D,C, K, Mg, zinc, copper      . Cholecalciferol (VITAMIN D3) 5000 UNITS TABS Take 1 tablet by mouth daily.      . clobetasol cream (TEMOVATE) 0.05 % S: Apply thin film externally to affected areas at bedtime for 2-4 weeks. Then taper every other to every third night for 2 more weeks and then stop it. Use prn as discussed after that.  30 g  4  . levothyroxine (SYNTHROID, LEVOTHROID) 112 MCG tablet Take 1 tablet (112 mcg total) by mouth daily.  90 tablet  1  . omeprazole (PRILOSEC OTC) 20 MG tablet Take 20 mg by mouth daily.      . Probiotic Product (PROBIOTIC PO) Take 1-2 capsules by mouth daily.       . hyoscyamine (LEVSIN, ANASPAZ) 0.125 MG tablet Take 0.125 mg by mouth every 4 (four) hours as needed for cramping.       No current facility-administered medications on file prior to visit.   Allergies  Allergen Reactions  . Aspirin     REACTION: severe abdominal pain and excessive salavation Only takes Aleve  . Atorvastatin Other (See Comments)    myalgia  . Zofran [Ondansetron Hcl]     Headache   . Adhesive  [Tape] Rash    Looks burned  . Clarithromycin Rash    Says allergic to "mycins"  . Penicillins Swelling and Rash    Hands swelling  Family History  Problem Relation Age of Onset  . Allergies Mother   . Heart disease Father   . Colon cancer Neg Hx    PE: BP 104/62  Pulse 78  Temp(Src) 98.5 F (36.9 C) (Oral)  Resp 12  Ht 5' 0.75" (1.543 m)  Wt 168 lb (76.204 kg)  BMI 32.01 kg/m2  SpO2 95% Wt Readings from Last 3 Encounters:  03/06/14 168 lb (76.204 kg)  02/19/14 164 lb (74.39 kg)  12/12/13 164 lb 2 oz (74.447 kg)   Constitutional: overweight, in NAD Eyes: PERRLA, EOMI, no exophthalmos ENT: moist mucous membranes, no thyromegaly, no cervical lymphadenopathy Cardiovascular: RRR, No MRG Respiratory: CTA B Gastrointestinal: abdomen soft, NT, ND, BS+ Musculoskeletal: no deformities, strength intact in all 4 Skin: moist, warm, no rashes Neurological: no tremor with outstretched hands, DTR normal in all 4  ASSESSMENT: 1. Hyperlipidemia  2. Hypothyroidism - managed by PCP - TSH at goal  PLAN:  1. Patient with long-standing hyperlipidemia, not on therapy.  - We discussed about the fact that she is at higher cardiac risk 2/2 her high LDL.  - She agrees that this needs to be decreased. She had great success in the past with Crestor (while on CoQ10), although this did not decreased the LDL quite to goal.  - She agrees to restart Crestor 5 mg initially every 7, then 6, then 5 days, then hopefully slowly getting ger to take it every day. If she can tolerate it, we can increase to 10 mg daily - advised to restart CoQ10 - given coupon for Crestor - we discussed her diet briefly - she is mostly vegetarian - advised her to stop the eggs as they are full of cholesterol - may need more nutrition advice at next visit.  - will check labs in 6 weeks: lipids, LFTs and vitamin D - will see her in 3 months  2. Hypothyroidism - advised pt to take Levothyroxine with water, >30 min before  b'fast, separated by >4h from anti acid medication, calcium, iron, MVI. She will start separating it from ca more - she asks me if we can check other thyroid tests to see if something is causing her fatigue as she feels that this is related to her thyroid (TSH being normal). I explained that the TSH is the main test and the others are not helping much. She was not happy with this response as she told me that her daughter's Dr checks many thyroid tests when she sees her. Agrees to continue the current LT4, though.  - she will continue to see PCP for this

## 2014-03-21 DIAGNOSIS — M1710 Unilateral primary osteoarthritis, unspecified knee: Secondary | ICD-10-CM | POA: Diagnosis not present

## 2014-04-02 ENCOUNTER — Encounter: Payer: Medicare Other | Admitting: Internal Medicine

## 2014-04-08 ENCOUNTER — Ambulatory Visit (INDEPENDENT_AMBULATORY_CARE_PROVIDER_SITE_OTHER): Payer: Medicare Other | Admitting: Cardiology

## 2014-04-08 ENCOUNTER — Encounter: Payer: Self-pay | Admitting: Cardiology

## 2014-04-08 VITALS — BP 130/78 | HR 66 | Ht 61.0 in | Wt 163.0 lb

## 2014-04-08 DIAGNOSIS — E785 Hyperlipidemia, unspecified: Secondary | ICD-10-CM

## 2014-04-08 DIAGNOSIS — R0609 Other forms of dyspnea: Secondary | ICD-10-CM

## 2014-04-08 DIAGNOSIS — R5381 Other malaise: Secondary | ICD-10-CM

## 2014-04-08 DIAGNOSIS — R9439 Abnormal result of other cardiovascular function study: Secondary | ICD-10-CM

## 2014-04-08 DIAGNOSIS — R0600 Dyspnea, unspecified: Secondary | ICD-10-CM

## 2014-04-08 DIAGNOSIS — R5383 Other fatigue: Secondary | ICD-10-CM

## 2014-04-08 DIAGNOSIS — R0989 Other specified symptoms and signs involving the circulatory and respiratory systems: Secondary | ICD-10-CM

## 2014-04-08 DIAGNOSIS — R0602 Shortness of breath: Secondary | ICD-10-CM | POA: Diagnosis not present

## 2014-04-08 DIAGNOSIS — R5382 Chronic fatigue, unspecified: Secondary | ICD-10-CM

## 2014-04-08 NOTE — Progress Notes (Signed)
Patient ID: Marilyn Rivas, female   DOB: 1941-06-04, 73 y.o.   MRN: [id number]    Patient Name: Marilyn Rivas Date of Encounter: 04/08/2014  Primary Care Provider:  Kelton Pillar, MD Primary Cardiologist:  Dorothy Spark  Problem List   Past Medical History  Diagnosis Date  . Thyroid disease   . Hyperlipidemia   . Menopause   . Osteopenia   . Diverticulosis   . Diverticulitis   . Hx of adenomatous colonic polyps   . Seasonal allergies   . Cataracts, both eyes   . GERD (gastroesophageal reflux disease)     HISTORY  . Arthritis   . Lichen sclerosus 09/4399    Biopsy proven   Past Surgical History  Procedure Laterality Date  . Appendectomy  1977  . Right oophorectomy  1977  . Patella reconstruction  1994    left  . Cervical fusion  1999    C5-7  . Lumbar disc surgery  2004    L5  . Cataract surgery Bilateral 06/2012  . Dilatation & curettage/hysteroscopy with trueclear N/A 05/02/2013    Procedure: DILATATION & CURETTAGE/HYSTEROSCOPY WITH TRUECLEAR ;  Surgeon: Anastasio Auerbach, MD;  Location: [facility];  Service: Gynecology;  Laterality: N/A;  . Hysteroscopy    . Dilation and curettage of uterus    . Hardware removal Left 12/12/2013    Procedure:  LEFT PATELLA HARDWARE REMOVAL;  Surgeon: [name], MD;  Location: [place];  Service: Orthopedics;  Laterality: Left;  . Knee arthroscopy Left 12/12/2013    Procedure: LEFT PATELLA ARTHROSCOPY KNEE WITH DEBRIDEMENT/SHAVING (CONDROPLASTY), LYSIS OF ADHESIONS;  Surgeon: [name], MD;  Location: [place];  Service: Orthopedics;  Laterality: Left;    Allergies  Allergies  Allergen Reactions  . Aleve [Naproxen Sodium] Other (See Comments)    Severe abdominal pain  . Aspirin     REACTION: severe abdominal pain and excessive salavation   . Atorvastatin Other (See Comments)    myalgia  . Zofran [Ondansetron Hcl]     Headache   . Adhesive [Tape] Rash    Looks  burned  . Clarithromycin Rash    Says allergic to "mycins"  . Penicillins Swelling and Rash    Hands swelling    HPI  A very pleasant 73 year old female who is coming for concern of dyspnea on exertion and fatigue. The patient is generally very active she exercises twice a week in the gym and walks almost daily. She has noticed recently that occasionally she gets short of breath on exertion but overall she feels very tired. The patient has no prior history of cardiac disease. She has never had an echocardiogram. She has known history of hyperlipidemia there was recently started on Crestor 5 mg daily. She reports muscle cramping and is scheduled for a blood work next week. The patient denies any chest pain, palpitation or recent syncope. She has smoked 30 years ago. Her father had myocardial infarction at age of 20 and underwent quadruple bypass and AAA repair at age of 38. Her daughter has a pacemaker for unknown reason.  Home Medications  Prior to Admission medications   Medication Sig Start Date End Date Taking? Authorizing Provider  acetaminophen (TYLENOL) 325 MG tablet Take 325 mg by mouth every 6 (six) hours as needed.   Yes Historical Provider, MD  Calcium Citrate-Vitamin D (CALCIUM CITRATE + PO) Take 4 capsules by mouth daily. Contain 800mg  calcium citrate and other vitamins D,C, K,  Mg, zinc, copper   Yes Historical Provider, MD  Cholecalciferol (VITAMIN D3) 5000 UNITS TABS Take 1 tablet by mouth daily.   Yes Historical Provider, MD  diphenhydramine-acetaminophen (TYLENOL PM) 25-500 MG TABS Take 1 tablet by mouth at bedtime as needed.   Yes Historical Provider, MD  hyoscyamine (LEVSIN, ANASPAZ) 0.125 MG tablet Take 0.125 mg by mouth every 4 (four) hours as needed for cramping.   Yes Historical Provider, MD  levothyroxine (SYNTHROID, LEVOTHROID) 112 MCG tablet Take 1 tablet (112 mcg total) by mouth daily. 06/29/12  Yes Lanice Shirts, MD  omeprazole (PRILOSEC OTC) 20 MG tablet Take  20 mg by mouth daily.   Yes Historical Provider, MD  Probiotic Product (PROBIOTIC PO) Take 1-2 capsules by mouth as needed.    Yes Historical Provider, MD  rosuvastatin (CRESTOR) 5 MG tablet Take 5 mg by mouth 2 (two) times a week. 03/06/14  Yes Philemon Kingdom, MD    Family History  Family History  Problem Relation Age of Onset  . Allergies Mother   . Heart disease Father   . Colon cancer Neg Hx     Social History  History   Social History  . Marital Status: Single    Spouse Name: N/A    Number of Children: 2  . Years of Education: N/A   Occupational History  . RESEARCH ASSIST     Retired    Social History Main Topics  . Smoking status: Former Smoker    Quit date: 12/10/1983  . Smokeless tobacco: Never Used  . Alcohol Use: Yes     Comment: OCC  . Drug Use: No  . Sexual Activity: No   Other Topics Concern  . Not on file   Social History Narrative   Daily caffeine      Review of Systems, as per HPI, otherwise negative General:  No chills, fever, night sweats or weight changes.  Cardiovascular:  No chest pain, dyspnea on exertion, edema, orthopnea, palpitations, paroxysmal nocturnal dyspnea. Dermatological: No rash, lesions/masses Respiratory: No cough, dyspnea Urologic: No hematuria, dysuria Abdominal:   No nausea, vomiting, diarrhea, bright red blood per rectum, melena, or hematemesis Neurologic:  No visual changes, wkns, changes in mental status. All other systems reviewed and are otherwise negative except as noted above.  Physical Exam  Blood pressure 130/78, pulse 66, height 5\' 1"  (1.549 m), weight 163 lb (73.936 kg).  General: Pleasant, NAD Psych: Normal affect. Neuro: Alert and oriented X 3. Moves all extremities spontaneously. HEENT: Normal  Neck: Supple without bruits or JVD. Lungs:  Resp regular and unlabored, CTA. Heart: RRR no s3, s4, or murmurs. Abdomen: Soft, non-tender, non-distended, BS + x 4.  Extremities: No clubbing, cyanosis or edema.  DP/PT/Radials 2+ and equal bilaterally.  Labs:  No results found for this basename: CKTOTAL, CKMB, TROPONINI,  in the last 72 hours Lab Results  Component Value Date   WBC 8.3 02/17/2014   HGB 15.2* 02/17/2014   HCT 43.5 02/17/2014   MCV 88.2 02/17/2014   PLT 301 02/17/2014    No results found for this basename: DDIMER   No components found with this basename: POCBNP,     Component Value Date/Time   NA 140 02/19/2014 0943   K 4.1 02/19/2014 0943   CL 102 02/19/2014 0943   CO2 29 02/19/2014 0943   GLUCOSE 86 02/19/2014 0943   BUN 12 02/19/2014 0943   CREATININE 0.64 02/19/2014 0943   CREATININE 0.71 04/23/2013 1237   CALCIUM 9.9 02/19/2014  0943   PROT 6.8 02/19/2014 0943   ALBUMIN 4.7 02/19/2014 0943   AST 25 02/19/2014 0943   ALT 30 02/19/2014 0943   ALKPHOS 63 02/19/2014 0943   BILITOT 0.8 02/19/2014 0943   GFRNONAA 84* 04/23/2013 1237   GFRAA >90 04/23/2013 1237   Lab Results  Component Value Date   CHOL 281* 02/19/2014   HDL 52 02/19/2014   LDLCALC 181* 02/19/2014   TRIG 240* 02/19/2014    Accessory Clinical Findings  Echocardiogram - none  ECG - 02/19/2014 - sinus bradycardia, diffuse negative T waves in inferior anterolateral leads    Assessment & Plan  1.  dyspnea on exertion, fatigue - to gather with diffuse negative T waves in inferior and anterolateral leads this is very concerning for possible ischemia. We'll check baseline cardiogram and so schedule an exercise nuclear stress test to rule out ischemia.  2. blood pressure - well controlled  3. Hyperlipidemia - recently started on Crestor 5 mg daily, patient is experiencing muscle cramping she scheduled for liver enzyme check and CPK checked next week. We will follow  Followup in 2 months   Zebbie Ace, Jamse Belfast, MD, [facility] 04/08/2014, 9:44 AM

## 2014-04-08 NOTE — Patient Instructions (Signed)
Your physician recommends that you continue on your current medications as directed. Please refer to the Current Medication list given to you today.  Your physician has requested that you have an echocardiogram. Echocardiography is a painless test that uses sound waves to create images of your heart. It provides your doctor with information about the size and shape of your heart and how well your heart's chambers and valves are working. This procedure takes approximately one hour. There are no restrictions for this procedure.  Your physician has requested that you have en exercise stress myoview. For further information please visit [email or web address]. Please follow instruction sheet, as given.  Your physician recommends that you schedule a follow-up appointment in: Marilyn Rivas

## 2014-04-11 ENCOUNTER — Ambulatory Visit (HOSPITAL_COMMUNITY): Payer: Medicare Other | Attending: Cardiology | Admitting: Cardiology

## 2014-04-11 DIAGNOSIS — R0602 Shortness of breath: Secondary | ICD-10-CM | POA: Diagnosis not present

## 2014-04-11 DIAGNOSIS — R0609 Other forms of dyspnea: Secondary | ICD-10-CM | POA: Diagnosis present

## 2014-04-11 DIAGNOSIS — I519 Heart disease, unspecified: Secondary | ICD-10-CM | POA: Diagnosis not present

## 2014-04-11 DIAGNOSIS — R5382 Chronic fatigue, unspecified: Secondary | ICD-10-CM

## 2014-04-11 NOTE — Progress Notes (Signed)
Echo performed. 

## 2014-04-14 ENCOUNTER — Encounter: Payer: Self-pay | Admitting: Gynecology

## 2014-04-14 ENCOUNTER — Ambulatory Visit (INDEPENDENT_AMBULATORY_CARE_PROVIDER_SITE_OTHER): Payer: Medicare Other | Admitting: Gynecology

## 2014-04-14 ENCOUNTER — Ambulatory Visit (HOSPITAL_COMMUNITY): Payer: Medicare Other | Attending: Cardiology | Admitting: Radiology

## 2014-04-14 VITALS — BP 122/72 | HR 50 | Ht 61.0 in | Wt 159.0 lb

## 2014-04-14 DIAGNOSIS — Z87891 Personal history of nicotine dependence: Secondary | ICD-10-CM | POA: Diagnosis not present

## 2014-04-14 DIAGNOSIS — Z8249 Family history of ischemic heart disease and other diseases of the circulatory system: Secondary | ICD-10-CM | POA: Insufficient documentation

## 2014-04-14 DIAGNOSIS — R5381 Other malaise: Secondary | ICD-10-CM | POA: Insufficient documentation

## 2014-04-14 DIAGNOSIS — N3000 Acute cystitis without hematuria: Secondary | ICD-10-CM

## 2014-04-14 DIAGNOSIS — R5383 Other fatigue: Principal | ICD-10-CM

## 2014-04-14 DIAGNOSIS — R350 Frequency of micturition: Secondary | ICD-10-CM

## 2014-04-14 DIAGNOSIS — R0989 Other specified symptoms and signs involving the circulatory and respiratory systems: Secondary | ICD-10-CM | POA: Diagnosis not present

## 2014-04-14 DIAGNOSIS — R0609 Other forms of dyspnea: Secondary | ICD-10-CM | POA: Diagnosis not present

## 2014-04-14 DIAGNOSIS — R0602 Shortness of breath: Secondary | ICD-10-CM

## 2014-04-14 DIAGNOSIS — R5382 Chronic fatigue, unspecified: Secondary | ICD-10-CM

## 2014-04-14 LAB — URINALYSIS W MICROSCOPIC + REFLEX CULTURE
Bilirubin Urine: NEGATIVE
Casts: NONE SEEN
Crystals: NONE SEEN
Glucose, UA: NEGATIVE mg/dL
Ketones, ur: NEGATIVE mg/dL
Leukocytes, UA: NEGATIVE
Nitrite: NEGATIVE
Protein, ur: NEGATIVE mg/dL
Specific Gravity, Urine: <1.005 — ABNORMAL LOW (ref 1.005–1.030)
Urobilinogen, UA: 0.2 mg/dL (ref 0.0–1.0)
pH: 6 (ref 5.0–8.0)

## 2014-04-14 MED ORDER — SULFAMETHOXAZOLE-TRIMETHOPRIM 800-160 MG PO TABS: 1.0000 | ORAL_TABLET | Freq: Two times a day (BID) | ORAL | Status: DC

## 2014-04-14 MED ORDER — TECHNETIUM TC 99M SESTAMIBI GENERIC - CARDIOLITE
30.0000 | Freq: Once | INTRAVENOUS | Status: AC | PRN
  Administered 2014-04-14: 30 via INTRAVENOUS

## 2014-04-14 MED ORDER — TECHNETIUM TC 99M SESTAMIBI GENERIC - CARDIOLITE
10.0000 | Freq: Once | INTRAVENOUS | Status: AC | PRN
  Administered 2014-04-14: 10 via INTRAVENOUS

## 2014-04-14 NOTE — Progress Notes (Signed)
Marilyn Rivas 1940/12/09 [id number]        72 y.o.  presents with 2 days of urinary frequency and odor in her urine. Slight low back discomfort. No fever or chills or significant dysuria.  Past medical history,surgical history, problem list, medications, allergies, family history and social history were all reviewed and documented in the EPIC chart.  Directed ROS with pertinent positives and negatives documented in the history of present illness/assessment and plan.  Exam: Kim assistant General appearance:  Normal Spine straight without CVA tenderness Abdomen soft nontender without masses guarding rebound organomegaly Pelvic external BUS vagina with atrophic changes. Bimanual without masses or tenderness  Assessment/Plan:  73 y.o. with symptoms to suggest early UTI. Urinalysis does show rare bacteria 0-2 WBC 3-6 RBC. We'll cover for early UTI with Septra DS one by mouth twice a day x3 days. Followup if symptoms persist, worsen or recur.   Note: This document was prepared with digital dictation and possible smart phrase technology. Any transcriptional errors that result from this process are unintentional.   Anastasio Auerbach MD, 12:17 PM 04/14/2014

## 2014-04-14 NOTE — Patient Instructions (Signed)
Take antibiotic pill twice daily for 3 days. Followup if your urinary symptoms persist, worsen or recur.

## 2014-04-14 NOTE — Progress Notes (Signed)
[facility] [address] [place], [place] [address] [phone]    Cardiology Nuclear Med Study  Marilyn Rivas is a 73 y.o. female     MRN : [id number]     DOB: June 19, 1941  Procedure Date: 04/14/2014  Nuclear Med Background Indication for Stress Test:  Evaluation for Ischemia History: No prior known history of CAD, 04-11-2014 Echo: EF=60-65% Cardiac Risk Factors: Family History - CAD, History of Smoking and Lipids  Symptoms:  DOE and Fatigue   Nuclear Pre-Procedure Caffeine/Decaff Intake:  None > 12 hrs NPO After: 7:30pm   Lungs:  clear O2 Sat: 96% on room air. IV 0.9% NS with Angio Cath:  22g  IV Site: R Hand x 1, tolerated well IV Started by:  Irven Baltimore, RN  Chest Size (in):  40 Cup Size: D  Height: 5\' 1"  (1.549 m)  Weight:  159 lb (72.122 kg)  BMI:  Body mass index is 30.06 kg/(m^2). Tech Comments:  N/A    Nuclear Med Study 1 or 2 day study: 1 day  Stress Test Type:  Stress  Reading MD: N/A  Order Authorizing Provider:  Ena Dawley, MD  Resting Radionuclide: Technetium 25m Sestamibi  Resting Radionuclide Dose: 11.0 mCi   Stress Radionuclide:  Technetium 41m Sestamibi  Stress Radionuclide Dose: 33.0 mCi           Stress Protocol Rest HR: 50 Stress HR: 134  Rest BP: 122/72 Stress BP: 212/55  Exercise Time (min): 5:30 METS: 7.0   Predicted Max HR: 147 bpm % Max HR: 91.16 bpm Rate Pressure Product: 26712   Dose of Adenosine (mg):  n/a Dose of Lexiscan: n/a mg  Dose of Atropine (mg): n/a Dose of Dobutamine: n/a mcg/kg/min (at max HR)  Stress Test Technologist: Irven Baltimore, RN  Nuclear Technologist:  Vedia Pereyra, CNMT     Rest Procedure:  Myocardial perfusion imaging was performed at rest 45 minutes following the intravenous administration of Technetium 83m Sestamibi. Rest ECG: NSR with non-specific ST-T wave changes and Lateral TWI's  Stress Procedure:  The patient exercised on the treadmill utilizing the Bruce  Protocol for 5:30 minutes, RPE=15. The patient stopped due to DOE and denied any chest pain.  Technetium 66m Sestamibi was injected at peak exercise and myocardial perfusion imaging was performed after a brief delay. Stress ECG: 82mm lateral ST depression and TWI's  QPS Raw Data Images:  Normal; no motion artifact; normal heart/lung ratio. Stress Images:  Normal homogeneous uptake in all areas of the myocardium. Rest Images:  Normal homogeneous uptake in all areas of the myocardium. Subtraction (SDS):  No evidence of ischemia. Transient Ischemic Dilatation (Normal <1.22):  0.82 Lung/Heart Ratio (Normal <0.45):  0.25  Quantitative Gated Spect Images QGS EDV:  60 ml QGS ESV:  12 ml  Impression Exercise Capacity:  Fair exercise capacity. BP Response:  Hypertensive blood pressure response. Clinical Symptoms:  No significant symptoms noted. ECG Impression:  Significant ST abnormalities consistent with ischemia. Comparison with Prior Nuclear Study: No previous nuclear study performed  Overall Impression:  Low risk stress nuclear study with normal perfusion. TID 0.82. There was <1 mm lateral ST depression at rest that became 2 mm horizontal with exercise. TWI's in this area were negative, but became upright in recovery then inverted at the end of the study. This is technically suggestive of ischemia, but may represent repolarization abnormality. Clinical correlation is advised -considering balanced ischemia as a possibility..  LV Ejection Fraction: 80%.  LV Wall Motion:  Normal Wall Motion  Pixie Casino, MD, [facility] Board Certified in Nuclear Cardiology Attending Cardiologist [place]

## 2014-04-15 LAB — URINE CULTURE
COLONY COUNT: NO GROWTH
ORGANISM ID, BACTERIA: NO GROWTH

## 2014-04-17 ENCOUNTER — Other Ambulatory Visit: Payer: Self-pay | Admitting: *Deleted

## 2014-04-17 ENCOUNTER — Other Ambulatory Visit (INDEPENDENT_AMBULATORY_CARE_PROVIDER_SITE_OTHER): Payer: Medicare Other

## 2014-04-17 ENCOUNTER — Encounter: Payer: Self-pay | Admitting: *Deleted

## 2014-04-17 ENCOUNTER — Telehealth: Payer: Self-pay | Admitting: Cardiology

## 2014-04-17 DIAGNOSIS — R0609 Other forms of dyspnea: Secondary | ICD-10-CM

## 2014-04-17 DIAGNOSIS — E785 Hyperlipidemia, unspecified: Secondary | ICD-10-CM

## 2014-04-17 DIAGNOSIS — R9439 Abnormal result of other cardiovascular function study: Secondary | ICD-10-CM

## 2014-04-17 DIAGNOSIS — M810 Age-related osteoporosis without current pathological fracture: Secondary | ICD-10-CM | POA: Diagnosis not present

## 2014-04-17 DIAGNOSIS — R001 Bradycardia, unspecified: Secondary | ICD-10-CM

## 2014-04-17 DIAGNOSIS — R739 Hyperglycemia, unspecified: Secondary | ICD-10-CM

## 2014-04-17 LAB — COMPREHENSIVE METABOLIC PANEL
ALBUMIN: 4.1 g/dL (ref 3.5–5.2)
ALK PHOS: 79 U/L (ref 39–117)
ALT: 31 U/L (ref 0–35)
AST: 26 U/L (ref 0–37)
BUN: 12 mg/dL (ref 6–23)
CALCIUM: 9.3 mg/dL (ref 8.4–10.5)
CHLORIDE: 105 meq/L (ref 96–112)
CO2: 26 mEq/L (ref 19–32)
Creatinine, Ser: 0.8 mg/dL (ref 0.4–1.2)
GFR: 71.44 mL/min (ref >60.00)
GLUCOSE: 97 mg/dL (ref 70–99)
Potassium: 4 mEq/L (ref 3.5–5.1)
SODIUM: 138 meq/L (ref 135–145)
TOTAL PROTEIN: 6.9 g/dL (ref 6.0–8.3)
Total Bilirubin: 0.4 mg/dL (ref 0.2–1.2)

## 2014-04-17 LAB — LIPID PANEL
CHOLESTEROL: 199 mg/dL (ref 0–200)
HDL: 49.9 mg/dL (ref >39.00)
LDL Cholesterol: 123 mg/dL — ABNORMAL HIGH (ref 0–99)
NonHDL: 149.1
TRIGLYCERIDES: 129 mg/dL (ref 0.0–149.0)
Total CHOL/HDL Ratio: 4
VLDL: 25.8 mg/dL (ref 0.0–40.0)

## 2014-04-17 LAB — VITAMIN D 25 HYDROXY (VIT D DEFICIENCY, FRACTURES): VITD: 58.94 ng/mL (ref 30.00–100.00)

## 2014-04-17 NOTE — Telephone Encounter (Signed)
New Message  Pt requests a call back to discuss having the lab at [facility] to authorize a particular test from the blood previously taken for Dr. Francesca Oman use recently. Please call back to discuss options with pt//sr

## 2014-04-17 NOTE — Addendum Note (Signed)
Addended by: Dorothy Spark on: 04/17/2014 12:02 PM   Modules accepted: Orders

## 2014-04-17 NOTE — Telephone Encounter (Signed)
Contacted pt to inform her that per Dr Meda Coffee her stress test is suggestive of ischemia, and she recommends a left sided cath to be done.  Pt verbalized understanding and agrees with proceeding with setting up the cath.  Contacted the cath lab and pt is set up for a left sided cath for 04/22/14 Tuesday at 0730 (pt to be there at [facility]) with Dr Tamala Julian, at [facility]. Pt is to come and get pre-procedure labs done tomorrow 04/18/14, and is aware of this.  Pt requesting a CPK and a LFT be added on to the pre-procedure labs for tomorrow, as she discussed this with Dr Meda Coffee at last OV.  Labs were added on.  Informed pt that I will have her letter of instruction available for her to pick up tomorrow when she comes in for labs.  Went over instructions for cath with the pt on the phone as well.   Will send Dr Meda Coffee a message to place orders in.  Pt verbalized understanding and agrees with the plan, times, and dates.

## 2014-04-18 ENCOUNTER — Other Ambulatory Visit (INDEPENDENT_AMBULATORY_CARE_PROVIDER_SITE_OTHER): Payer: Medicare Other

## 2014-04-18 DIAGNOSIS — E785 Hyperlipidemia, unspecified: Secondary | ICD-10-CM

## 2014-04-18 DIAGNOSIS — R739 Hyperglycemia, unspecified: Secondary | ICD-10-CM

## 2014-04-18 DIAGNOSIS — R0989 Other specified symptoms and signs involving the circulatory and respiratory systems: Secondary | ICD-10-CM

## 2014-04-18 DIAGNOSIS — I498 Other specified cardiac arrhythmias: Secondary | ICD-10-CM | POA: Diagnosis not present

## 2014-04-18 DIAGNOSIS — R0609 Other forms of dyspnea: Secondary | ICD-10-CM

## 2014-04-18 DIAGNOSIS — R9439 Abnormal result of other cardiovascular function study: Secondary | ICD-10-CM | POA: Diagnosis not present

## 2014-04-18 DIAGNOSIS — R001 Bradycardia, unspecified: Secondary | ICD-10-CM

## 2014-04-18 DIAGNOSIS — R7309 Other abnormal glucose: Secondary | ICD-10-CM

## 2014-04-18 LAB — CBC WITH DIFFERENTIAL/PLATELET
Basophils Absolute: 0 10*3/uL (ref 0.0–0.1)
Basophils Relative: 0.7 % (ref 0.0–3.0)
Eosinophils Absolute: 0.2 10*3/uL (ref 0.0–0.7)
Eosinophils Relative: 3.3 % (ref 0.0–5.0)
HCT: 45.4 % (ref 36.0–46.0)
Hemoglobin: 15.4 g/dL — ABNORMAL HIGH (ref 12.0–15.0)
Lymphocytes Relative: 28.7 % (ref 12.0–46.0)
Lymphs Abs: 1.9 10*3/uL (ref 0.7–4.0)
MCHC: 33.9 g/dL (ref 30.0–36.0)
MCV: 92.7 fl (ref 78.0–100.0)
Monocytes Absolute: 0.4 10*3/uL (ref 0.1–1.0)
Monocytes Relative: 6.2 % (ref 3.0–12.0)
Neutro Abs: 4.1 10*3/uL (ref 1.4–7.7)
Neutrophils Relative %: 61.1 % (ref 43.0–77.0)
Platelets: 286 10*3/uL (ref 150.0–400.0)
RBC: 4.9 Mil/uL (ref 3.87–5.11)
RDW: 13.4 % (ref 11.5–15.5)
WBC: 6.7 10*3/uL (ref 4.0–10.5)

## 2014-04-18 LAB — PROTIME-INR
INR: 0.9 ratio (ref 0.8–1.0)
Prothrombin Time: 10.4 s (ref 9.6–13.1)

## 2014-04-19 LAB — BASIC METABOLIC PANEL
BUN: 13 mg/dL (ref 6–23)
CO2: 23 mEq/L (ref 19–32)
Calcium: 9.8 mg/dL (ref 8.4–10.5)
Chloride: 106 mEq/L (ref 96–112)
Creatinine, Ser: 0.8 mg/dL (ref 0.4–1.2)
GFR: 74.54 mL/min (ref >60.00)
Glucose, Bld: 119 mg/dL — ABNORMAL HIGH (ref 70–99)
Potassium: 3.9 mEq/L (ref 3.5–5.1)
Sodium: 139 mEq/L (ref 135–145)

## 2014-04-19 LAB — HEPATIC FUNCTION PANEL
ALT: 33 U/L (ref 0–35)
AST: 31 U/L (ref 0–37)
Albumin: 4.3 g/dL (ref 3.5–5.2)
Alkaline Phosphatase: 85 U/L (ref 39–117)
Bilirubin, Direct: 0 mg/dL (ref 0.0–0.3)
Total Bilirubin: 0.7 mg/dL (ref 0.2–1.2)
Total Protein: 6.8 g/dL (ref 6.0–8.3)

## 2014-04-19 LAB — CK: Total CK: 72 U/L (ref 7–177)

## 2014-04-21 ENCOUNTER — Other Ambulatory Visit: Payer: Self-pay | Admitting: Interventional Cardiology

## 2014-04-21 ENCOUNTER — Telehealth: Payer: Self-pay | Admitting: Internal Medicine

## 2014-04-21 ENCOUNTER — Encounter: Payer: Self-pay | Admitting: *Deleted

## 2014-04-21 ENCOUNTER — Encounter (HOSPITAL_COMMUNITY): Payer: Self-pay | Admitting: Pharmacy Technician

## 2014-04-21 DIAGNOSIS — R931 Abnormal findings on diagnostic imaging of heart and coronary circulation: Secondary | ICD-10-CM

## 2014-04-21 NOTE — Telephone Encounter (Signed)
Returned pt's call. Advised pt per Dr Arman Filter result note, again. Pt very pleased with results. Pt stated that she was very inconsistent with taking the Crestor. She has not been taking it due to severe leg cramps. She said that she would like to discuss with Dr Cruzita Lederer about taking something different. Pt also said that she has had some shortness of breath and is having an angiogram tomorrow. Pt wants Dr Cruzita Lederer to be aware of this. Pt would like her lab results mailed to her. Be advised.

## 2014-04-21 NOTE — H&P (View-Only) (Signed)
[facility] [address] [place], [place] [address] [phone]    Cardiology Nuclear Med Study  Marilyn Rivas is a 73 y.o. female     MRN : [id number]     DOB: 03/26/1941  Procedure Date: 04/14/2014  Nuclear Med Background Indication for Stress Test:  Evaluation for Ischemia History: No prior known history of CAD, 04-11-2014 Echo: EF=60-65% Cardiac Risk Factors: Family History - CAD, History of Smoking and Lipids  Symptoms:  DOE and Fatigue   Nuclear Pre-Procedure Caffeine/Decaff Intake:  None > 12 hrs NPO After: 7:30pm   Lungs:  clear O2 Sat: 96% on room air. IV 0.9% NS with Angio Cath:  22g  IV Site: R Hand x 1, tolerated well IV Started by:  Irven Baltimore, RN  Chest Size (in):  40 Cup Size: D  Height: 5\' 1"  (1.549 m)  Weight:  159 lb (72.122 kg)  BMI:  Body mass index is 30.06 kg/(m^2). Tech Comments:  N/A    Nuclear Med Study 1 or 2 day study: 1 day  Stress Test Type:  Stress  Reading MD: N/A  Order Authorizing Provider:  Ena Dawley, MD  Resting Radionuclide: Technetium 65m Sestamibi  Resting Radionuclide Dose: 11.0 mCi   Stress Radionuclide:  Technetium 89m Sestamibi  Stress Radionuclide Dose: 33.0 mCi           Stress Protocol Rest HR: 50 Stress HR: 134  Rest BP: 122/72 Stress BP: 212/55  Exercise Time (min): 5:30 METS: 7.0   Predicted Max HR: 147 bpm % Max HR: 91.16 bpm Rate Pressure Product: 60630   Dose of Adenosine (mg):  n/a Dose of Lexiscan: n/a mg  Dose of Atropine (mg): n/a Dose of Dobutamine: n/a mcg/kg/min (at max HR)  Stress Test Technologist: Irven Baltimore, RN  Nuclear Technologist:  Vedia Pereyra, CNMT     Rest Procedure:  Myocardial perfusion imaging was performed at rest 45 minutes following the intravenous administration of Technetium 36m Sestamibi. Rest ECG: NSR with non-specific ST-T wave changes and Lateral TWI's  Stress Procedure:  The patient exercised on the treadmill utilizing the Bruce  Protocol for 5:30 minutes, RPE=15. The patient stopped due to DOE and denied any chest pain.  Technetium 33m Sestamibi was injected at peak exercise and myocardial perfusion imaging was performed after a brief delay. Stress ECG: 31mm lateral ST depression and TWI's  QPS Raw Data Images:  Normal; no motion artifact; normal heart/lung ratio. Stress Images:  Normal homogeneous uptake in all areas of the myocardium. Rest Images:  Normal homogeneous uptake in all areas of the myocardium. Subtraction (SDS):  No evidence of ischemia. Transient Ischemic Dilatation (Normal <1.22):  0.82 Lung/Heart Ratio (Normal <0.45):  0.25  Quantitative Gated Spect Images QGS EDV:  60 ml QGS ESV:  12 ml  Impression Exercise Capacity:  Fair exercise capacity. BP Response:  Hypertensive blood pressure response. Clinical Symptoms:  No significant symptoms noted. ECG Impression:  Significant ST abnormalities consistent with ischemia. Comparison with Prior Nuclear Study: No previous nuclear study performed  Overall Impression:  Low risk stress nuclear study with normal perfusion. TID 0.82. There was <1 mm lateral ST depression at rest that became 2 mm horizontal with exercise. TWI's in this area were negative, but became upright in recovery then inverted at the end of the study. This is technically suggestive of ischemia, but may represent repolarization abnormality. Clinical correlation is advised -considering balanced ischemia as a possibility..  LV Ejection Fraction: 80%.  LV Wall Motion:  Normal Wall Motion  Pixie Casino, MD, [facility] Board Certified in Nuclear Cardiology Attending Cardiologist [place]

## 2014-04-21 NOTE — Telephone Encounter (Signed)
Patient ask if you could call her with her lab results again, it wasn't very clear on her answering machine.

## 2014-04-21 NOTE — Telephone Encounter (Signed)
Yes, OK to mail her the labs. Even taking Crestor once a week has good results! We can discuss more at next visit.

## 2014-04-21 NOTE — Interval H&P Note (Signed)
Cath Lab Visit (complete for each Cath Lab visit)  Clinical Evaluation Leading to the Procedure:   ACS: No.  Non-ACS:    Anginal Classification: CCS II  Anti-ischemic medical therapy: Minimal Therapy (1 class of medications)  Non-Invasive Test Results: Low-risk stress test findings: cardiac mortality <1%/year  Prior CABG: No previous CABG      History and Physical Interval Note:  04/21/2014 4:32 PM  Marilyn Rivas  has presented today for surgery, with the diagnosis of positive stress test  The various methods of treatment have been discussed with the patient and family. After consideration of risks, benefits and other options for treatment, the patient has consented to  Procedure(s): LEFT HEART CATHETERIZATION WITH CORONARY ANGIOGRAM (N/A) as a surgical intervention .  The patient's history has been reviewed, patient examined, no change in status, stable for surgery.  I have reviewed the patient's chart and labs.  Questions were answered to the patient's satisfaction.     Sinclair Grooms

## 2014-04-21 NOTE — Interval H&P Note (Signed)
History and Physical Interval Note:  04/21/2014 4:32 PM  Marilyn Rivas  has presented today for surgery, with the diagnosis of positive stress test  The various methods of treatment have been discussed with the patient and family. After consideration of risks, benefits and other options for treatment, the patient has consented to  Procedure(s): LEFT HEART CATHETERIZATION WITH CORONARY ANGIOGRAM (N/A) as a surgical intervention .  The patient's history has been reviewed, patient examined, no change in status, stable for surgery.  I have reviewed the patient's chart and labs.  Questions were answered to the patient's satisfaction.     Sinclair Grooms

## 2014-04-22 ENCOUNTER — Ambulatory Visit (HOSPITAL_COMMUNITY)
Admission: RE | Admit: 2014-04-22 | Discharge: 2014-04-22 | Disposition: A | Discharge status: Home / Self Care | Payer: Medicare Other | Source: Ambulatory Visit | Attending: Interventional Cardiology | Admitting: Interventional Cardiology

## 2014-04-22 ENCOUNTER — Encounter (HOSPITAL_COMMUNITY)
Admission: RE | Disposition: A | Discharge status: Home / Self Care | Payer: Self-pay | Source: Ambulatory Visit | Attending: Interventional Cardiology

## 2014-04-22 DIAGNOSIS — R9439 Abnormal result of other cardiovascular function study: Secondary | ICD-10-CM | POA: Insufficient documentation

## 2014-04-22 DIAGNOSIS — E785 Hyperlipidemia, unspecified: Secondary | ICD-10-CM

## 2014-04-22 DIAGNOSIS — Z8249 Family history of ischemic heart disease and other diseases of the circulatory system: Secondary | ICD-10-CM | POA: Insufficient documentation

## 2014-04-22 DIAGNOSIS — R0989 Other specified symptoms and signs involving the circulatory and respiratory systems: Principal | ICD-10-CM | POA: Insufficient documentation

## 2014-04-22 DIAGNOSIS — R0609 Other forms of dyspnea: Secondary | ICD-10-CM | POA: Insufficient documentation

## 2014-04-22 DIAGNOSIS — Z87891 Personal history of nicotine dependence: Secondary | ICD-10-CM | POA: Insufficient documentation

## 2014-04-22 DIAGNOSIS — R931 Abnormal findings on diagnostic imaging of heart and coronary circulation: Secondary | ICD-10-CM

## 2014-04-22 HISTORY — PX: LEFT HEART CATHETERIZATION WITH CORONARY ANGIOGRAM: SHX5451

## 2014-04-22 SURGERY — LEFT HEART CATHETERIZATION WITH CORONARY ANGIOGRAM
Anesthesia: LOCAL

## 2014-04-22 MED ORDER — NITROGLYCERIN 1 MG/10 ML FOR IR/CATH LAB
INTRA_ARTERIAL | Status: AC
  Filled 2014-04-22: qty 10

## 2014-04-22 MED ORDER — SODIUM CHLORIDE 0.9 % IJ SOLN: 3.0000 mL | Freq: Two times a day (BID) | INTRAMUSCULAR | Status: DC

## 2014-04-22 MED ORDER — SODIUM CHLORIDE 0.9 % IV SOLN: INTRAVENOUS | Status: DC

## 2014-04-22 MED ORDER — HEPARIN (PORCINE) IN NACL 2-0.9 UNIT/ML-% IJ SOLN
INTRAMUSCULAR | Status: AC
  Filled 2014-04-22: qty 1000

## 2014-04-22 MED ORDER — MIDAZOLAM HCL 2 MG/2ML IJ SOLN
INTRAMUSCULAR | Status: AC
  Filled 2014-04-22: qty 2

## 2014-04-22 MED ORDER — VERAPAMIL HCL 2.5 MG/ML IV SOLN
INTRAVENOUS | Status: AC
Start: 2014-04-22 — End: 2014-04-22
  Filled 2014-04-22: qty 2

## 2014-04-22 MED ORDER — LIDOCAINE HCL (PF) 1 % IJ SOLN
INTRAMUSCULAR | Status: AC
Start: 2014-04-22 — End: 2014-04-22
  Filled 2014-04-22: qty 30

## 2014-04-22 MED ORDER — SODIUM CHLORIDE 0.9 % IJ SOLN
3.0000 mL | INTRAMUSCULAR | Status: DC | PRN
Start: 2014-04-22 — End: 2014-04-22

## 2014-04-22 MED ORDER — OXYCODONE-ACETAMINOPHEN 5-325 MG PO TABS: 1.0000 | ORAL_TABLET | ORAL | Status: DC | PRN

## 2014-04-22 MED ORDER — SODIUM CHLORIDE 0.9 % IV SOLN
INTRAVENOUS | Status: DC
  Administered 2014-04-22: 1000 mL via INTRAVENOUS

## 2014-04-22 MED ORDER — FENTANYL CITRATE 0.05 MG/ML IJ SOLN
INTRAMUSCULAR | Status: AC
  Filled 2014-04-22: qty 2

## 2014-04-22 MED ORDER — SODIUM CHLORIDE 0.9 % IV SOLN: 250.0000 mL | INTRAVENOUS | Status: DC | PRN

## 2014-04-22 MED ORDER — ACETAMINOPHEN 325 MG PO TABS: 650.0000 mg | ORAL_TABLET | ORAL | Status: DC | PRN

## 2014-04-22 MED ORDER — HEPARIN SODIUM (PORCINE) 1000 UNIT/ML IJ SOLN
INTRAMUSCULAR | Status: AC
Start: 2014-04-22 — End: 2014-04-22
  Filled 2014-04-22: qty 1

## 2014-04-22 NOTE — Discharge Instructions (Signed)
Radial Site Care °Refer to this sheet in the next few weeks. These instructions provide you with information on caring for yourself after your procedure. Your caregiver may also give you more specific instructions. Your treatment has been planned according to current medical practices, but problems sometimes occur. Call your caregiver if you have any problems or questions after your procedure. °HOME CARE INSTRUCTIONS °· You may shower the day after the procedure. Remove the bandage (dressing) and gently wash the site with plain soap and water. Gently pat the site dry. °· Do not apply powder or lotion to the site. °· Do not submerge the affected site in water for 3 to 5 days. °· Inspect the site at least twice daily. °· Do not flex or bend the affected arm for 24 hours. °· No lifting over 5 pounds (2.3 kg) for 5 days after your procedure. °· Do not drive home if you are discharged the same day of the procedure. Have someone else drive you. °· You may drive 24 hours after the procedure unless otherwise instructed by your caregiver. °· Do not operate machinery or power tools for 24 hours. °· A responsible adult should be with you for the first 24 hours after you arrive home. °What to expect: °· Any bruising will usually fade within 1 to 2 weeks. °· Blood that collects in the tissue (hematoma) may be painful to the touch. It should usually decrease in size and tenderness within 1 to 2 weeks. °SEEK IMMEDIATE MEDICAL CARE IF: °· You have unusual pain at the radial site. °· You have redness, warmth, swelling, or pain at the radial site. °· You have drainage (other than a small amount of blood on the dressing). °· You have chills. °· You have a fever or persistent symptoms for more than 72 hours. °· You have a fever and your symptoms suddenly get worse. °· Your arm becomes pale, cool, tingly, or numb. °· You have heavy bleeding from the site. Hold pressure on the site. °Document Released: 09/17/2010 Document Revised:  11/07/2011 Document Reviewed: 09/17/2010 °ExitCare® Patient Information ©2015 [employer]. This information is not intended to replace advice given to you by your health care provider. Make sure you discuss any questions you have with your health care provider. ° °

## 2014-04-22 NOTE — CV Procedure (Signed)
     Left Heart Catheterization with Coronary Angiography  Report  JAYDEE INGMAN  73 y.o.  female 1941-01-24  Procedure Date: 04/22/2014 Referring Physician: Lilian Kapur, M.D. Primary Cardiologist: Same  INDICATIONS: Dyspnea with abnormal exercise treadmill test  PROCEDURE: 1. Left heart catheterization; 2. Coronary angiography; 3. Left ventriculography  CONSENT:  The risks, benefits, and details of the procedure were explained in detail to the patient. Risks including death, stroke, heart attack, kidney injury, allergy, limb ischemia, bleeding and radiation injury were discussed.  The patient verbalized understanding and wanted to proceed.  Informed written consent was obtained.  PROCEDURE TECHNIQUE:  After Xylocaine anesthesia a 5 French Slender sheath was placed in the right radial artery with an angiocath and the modified Seldinger technique.  Coronary angiography was done using a 5 F JR 4 and JL 3.5 cm diagnostic catheter.  Left ventriculography was done using the JR 4 catheter and hand injection.   Digital images were reviewed and the case was terminated.  Hemostasis was achieved with a wrist band.   CONTRAST:  Total of 55 cc.  COMPLICATIONS:  None   HEMODYNAMICS:  Aortic pressure 135/75 mmHg; LV pressure 137/13 mmHg; LVEDP 16 mm mercury  ANGIOGRAPHIC DATA:   The left main coronary artery is normal.  The left anterior descending artery is normal. The distribution is to the apex but not transapical. A large branching diagonal arises proximally and is also widely patent..  The left circumflex artery is widely patent with 2 moderate sized obtuse marginal branches..  The right coronary artery is dominant and normal.   LEFT VENTRICULOGRAM:  Left ventricular angiogram was done in the 30 RAO projection and revealed normal cavity size and EF 60%.   IMPRESSIONS:  1. Normal coronary arteries 2. Normal left ventricular systolic function with EF 60% and normal  hemodynamics 3. Dyspnea not likely related to cardiac etiology   RECOMMENDATION:  Management per primary team with other considerations being pulmonary sources of dyspnea, deconditioning, and sleep apnea.Marland Kitchen

## 2014-04-22 NOTE — Telephone Encounter (Signed)
Called pt and advised her per Dr Arman Filter message. Pt said she will see Dr Cruzita Lederer in Oct and discuss.

## 2014-04-23 ENCOUNTER — Telehealth: Payer: Self-pay | Admitting: *Deleted

## 2014-04-23 ENCOUNTER — Ambulatory Visit: Payer: Medicare Other | Admitting: Women's Health

## 2014-04-23 DIAGNOSIS — R0600 Dyspnea, unspecified: Secondary | ICD-10-CM

## 2014-04-23 NOTE — Telephone Encounter (Signed)
Marilyn Rivas called wanting you to know that her test results from Cardiology were all normal.  Cardiologist recommends that she be referred to a Pulmonologist for SOB.

## 2014-04-28 DIAGNOSIS — M810 Age-related osteoporosis without current pathological fracture: Secondary | ICD-10-CM | POA: Diagnosis not present

## 2014-04-28 DIAGNOSIS — Z8262 Family history of osteoporosis: Secondary | ICD-10-CM | POA: Diagnosis not present

## 2014-05-12 ENCOUNTER — Telehealth: Payer: Self-pay | Admitting: Internal Medicine

## 2014-05-12 NOTE — Telephone Encounter (Signed)
Marilyn Rivas  Call this pt and let her know that her bone density shows osteoporosis in her hip.  She has seen Dr. Estanislado Pandy and Dr. Cruzita Lederer in the past.  Let her know there has not been worsening compared to 2 years ago but I still recommend she take a bone building  medication for this .    She can also discuss treatment options with either specialist listed above.  Route back to me with pt response

## 2014-05-12 NOTE — Telephone Encounter (Signed)
I spoke with Marilyn Rivas in regards to her DEXA scan. She does not want to see either of the specialist we recommended. She also does not want to take any medications to help with the osteoporosis. She is going to increase her calcium intake for now.-eh

## 2014-05-16 DIAGNOSIS — Z23 Encounter for immunization: Secondary | ICD-10-CM | POA: Diagnosis not present

## 2014-05-19 ENCOUNTER — Encounter: Payer: Self-pay | Admitting: *Deleted

## 2014-05-21 ENCOUNTER — Encounter: Payer: Self-pay | Admitting: *Deleted

## 2014-05-27 DIAGNOSIS — H01029 Squamous blepharitis unspecified eye, unspecified eyelid: Secondary | ICD-10-CM | POA: Diagnosis not present

## 2014-05-27 DIAGNOSIS — H1045 Other chronic allergic conjunctivitis: Secondary | ICD-10-CM | POA: Diagnosis not present

## 2014-06-06 ENCOUNTER — Encounter: Payer: Self-pay | Admitting: Pulmonary Disease

## 2014-06-06 ENCOUNTER — Ambulatory Visit: Payer: Medicare Other | Admitting: Internal Medicine

## 2014-06-06 ENCOUNTER — Other Ambulatory Visit: Payer: Medicare Other

## 2014-06-06 ENCOUNTER — Ambulatory Visit (INDEPENDENT_AMBULATORY_CARE_PROVIDER_SITE_OTHER): Payer: Medicare Other | Admitting: Pulmonary Disease

## 2014-06-06 VITALS — BP 133/72 | HR 77 | Temp 98.0°F | Ht 60.0 in | Wt 168.4 lb

## 2014-06-06 DIAGNOSIS — R0609 Other forms of dyspnea: Secondary | ICD-10-CM | POA: Diagnosis not present

## 2014-06-06 DIAGNOSIS — R0602 Shortness of breath: Secondary | ICD-10-CM | POA: Diagnosis not present

## 2014-06-06 DIAGNOSIS — H01004 Unspecified blepharitis left upper eyelid: Secondary | ICD-10-CM | POA: Diagnosis not present

## 2014-06-06 DIAGNOSIS — H01005 Unspecified blepharitis left lower eyelid: Secondary | ICD-10-CM | POA: Diagnosis not present

## 2014-06-06 DIAGNOSIS — H01002 Unspecified blepharitis right lower eyelid: Secondary | ICD-10-CM | POA: Diagnosis not present

## 2014-06-06 DIAGNOSIS — H01001 Unspecified blepharitis right upper eyelid: Secondary | ICD-10-CM | POA: Diagnosis not present

## 2014-06-06 NOTE — Patient Instructions (Signed)
Blood test  If positive , proceed with CT scan of chest Increase aerobic exercise as discussed

## 2014-06-06 NOTE — Assessment & Plan Note (Addendum)
Cause for her dyspnea is not apparent. Cardiac workup has been negative. She does not seem to have his obstruction, pulmonary hypertension or evidence of fibrosis. Will check d-dimer If positive , proceed with CT scan of chest Deconditioning is a possibility, but desaturation on exertion does suggest some cardiopulmonary etiology- Increase aerobic exercise as discussed

## 2014-06-06 NOTE — Progress Notes (Signed)
Subjective:    Patient ID: Marilyn Rivas, female    DOB: 27-Aug-1941, 73 y.o.   MRN: [id number]  HPI  73 year old remote smoker presents for evaluation of dyspnea on exertion. She reports class III dyspnea, ongoing for the last 4 months. She did not have this problem during the winter. She reports dyspnea on routine activities around the house. She denies wheezing or cough or frequent chest colds. She denies chest pain, orthopnea or paroxysmal nocturnal dyspnea. She is able to perform her pool exercises, but admits that this is not strenuous activity. She smoked about 30-pack-years, before quitting in 1985. She had patellar reconstruction surgery but does not appear to be limited by this. She underwent cardiac evaluation- Echo-showed normal LV function. And RV pressures Treadmill stress imaging-was abnormal, hypertensive response, she was able to complete 8 minutes Cardiac catheterization-showed normal coronaries Spirometry showed moderate airway obstruction with a ratio of 47, FEV1 1.41-81% and FVC 3.0 L-131%. There was flattening of the expiratory limb, but this may have been due to poor effort. Chest x-ray 6/34/15 did not show infiltrates or effusions CT abdomen 02/2013 does not show any infiltrates at the lung bases. She  desaturated on walking to 89percent on the third lap.  Past Medical History  Diagnosis Date  . Thyroid disease   . Hyperlipidemia   . Menopause   . Osteopenia   . Diverticulosis   . Diverticulitis   . Hx of adenomatous colonic polyps   . Seasonal allergies   . Cataracts, both eyes   . GERD (gastroesophageal reflux disease)     HISTORY  . Arthritis   . Lichen sclerosus 02/8587    Biopsy proven    Past Surgical History  Procedure Laterality Date  . Appendectomy  1977  . Right oophorectomy  1977  . Patella reconstruction  1994    left  . Cervical fusion  1999    C5-7  . Lumbar disc surgery  2004    L5  . Cataract surgery Bilateral 06/2012  .  Dilatation & curettage/hysteroscopy with trueclear N/A 05/02/2013    Procedure: DILATATION & CURETTAGE/HYSTEROSCOPY WITH TRUECLEAR ;  Surgeon: Anastasio Auerbach, MD;  Location: [facility];  Service: Gynecology;  Laterality: N/A;  . Hysteroscopy    . Dilation and curettage of uterus    . Hardware removal Left 12/12/2013    Procedure:  LEFT PATELLA HARDWARE REMOVAL;  Surgeon: [name], MD;  Location: [facility];  Service: Orthopedics;  Laterality: Left;  . Knee arthroscopy Left 12/12/2013    Procedure: LEFT PATELLA ARTHROSCOPY KNEE WITH DEBRIDEMENT/SHAVING (CONDROPLASTY), LYSIS OF ADHESIONS;  Surgeon: [name], MD;  Location: [facility];  Service: Orthopedics;  Laterality: Left;    Allergies  Allergen Reactions  . Aleve [Naproxen Sodium] Other (See Comments)    Severe abdominal pain  . Aspirin Other (See Comments)    REACTION: severe abdominal pain and excessive salavation   . Atorvastatin Other (See Comments)    myalgia  . Adhesive [Tape] Rash    Looks burned  . Clarithromycin Rash    Says allergic to "mycins"  . Penicillins Swelling and Rash    Hands swelling  . Zofran [Ondansetron Hcl] Other (See Comments)    Headache     History   Social History  . Marital Status: Single    Spouse Name: N/A    Number of Children: 2  . Years of Education: N/A   Occupational History  . RESEARCH ASSIST  Retired    Social History Main Topics  . Smoking status: Former Smoker    Quit date: 12/10/1983  . Smokeless tobacco: Never Used  . Alcohol Use: Yes     Comment: OCC  . Drug Use: No  . Sexual Activity: No   Other Topics Concern  . Not on file   Social History Narrative   Daily caffeine     Family History  Problem Relation Age of Onset  . Allergies Mother   . Heart disease Father   . Colon cancer Neg Hx         Review of Systems  Constitutional: Negative.   HENT: Negative.   Eyes: Negative.   Respiratory: Positive for  shortness of breath.   Cardiovascular: Negative.   Gastrointestinal: Negative.   Endocrine: Negative.   Genitourinary: Negative.   Allergic/Immunologic: Negative.   Neurological: Negative.   Hematological: Negative.   Psychiatric/Behavioral: Negative.        Objective:   Physical Exam  Gen. Pleasant, obese, in no distress, normal affect ENT - no lesions, no post nasal drip, class 2 airway Neck: No JVD, no thyromegaly, no carotid bruits Lungs: no use of accessory muscles, no dullness to percussion, decreased without rales or rhonchi  Cardiovascular: Rhythm regular, heart sounds  normal, no murmurs or gallops, no peripheral edema Abdomen: soft and non-tender, no hepatosplenomegaly, BS normal. Musculoskeletal: No deformities, no cyanosis or clubbing Neuro:  alert, non focal, no tremors       Assessment & Plan:

## 2014-06-07 LAB — D-DIMER, QUANTITATIVE (NOT AT ARMC): D-Dimer, Quant: 0.29 ug/mL-FEU (ref 0.00–0.48)

## 2014-06-12 DIAGNOSIS — H01002 Unspecified blepharitis right lower eyelid: Secondary | ICD-10-CM | POA: Diagnosis not present

## 2014-06-12 DIAGNOSIS — H01001 Unspecified blepharitis right upper eyelid: Secondary | ICD-10-CM | POA: Diagnosis not present

## 2014-06-12 DIAGNOSIS — H01005 Unspecified blepharitis left lower eyelid: Secondary | ICD-10-CM | POA: Diagnosis not present

## 2014-06-12 DIAGNOSIS — H01004 Unspecified blepharitis left upper eyelid: Secondary | ICD-10-CM | POA: Diagnosis not present

## 2014-06-16 ENCOUNTER — Encounter: Payer: Self-pay | Admitting: *Deleted

## 2014-06-16 ENCOUNTER — Other Ambulatory Visit: Payer: Self-pay | Admitting: *Deleted

## 2014-06-17 ENCOUNTER — Ambulatory Visit (INDEPENDENT_AMBULATORY_CARE_PROVIDER_SITE_OTHER): Payer: Medicare Other | Admitting: Cardiology

## 2014-06-17 ENCOUNTER — Encounter: Payer: Self-pay | Admitting: Cardiology

## 2014-06-17 VITALS — BP 122/68 | HR 74 | Ht 60.0 in | Wt 165.0 lb

## 2014-06-17 DIAGNOSIS — E782 Mixed hyperlipidemia: Secondary | ICD-10-CM | POA: Diagnosis not present

## 2014-06-17 NOTE — Progress Notes (Signed)
Patient ID: Marilyn Rivas, female   DOB: 1941/07/25, 73 y.o.   MRN: [id number]    Patient Name: Marilyn Rivas Date of Encounter: 06/17/2014  Primary Care Provider:  Kelton Pillar, MD Primary Cardiologist:  Dorothy Spark  Problem List   Past Medical History  Diagnosis Date  . Thyroid disease   . Hyperlipidemia   . Menopause   . Osteopenia   . Diverticulosis   . Diverticulitis   . Hx of adenomatous colonic polyps   . Seasonal allergies   . Cataracts, both eyes   . GERD (gastroesophageal reflux disease)     HISTORY  . Arthritis   . Lichen sclerosus 11/8183    Biopsy proven   Past Surgical History  Procedure Laterality Date  . Appendectomy  1977  . Right oophorectomy  1977  . Patella reconstruction Left 1994  . Cervical fusion  1999    C5-7  . Lumbar disc surgery  2004    L5  . Cataract surgery Bilateral 06/2012  . Dilatation & curettage/hysteroscopy with trueclear N/A 05/02/2013    Procedure: DILATATION & CURETTAGE/HYSTEROSCOPY WITH TRUECLEAR ;  Surgeon: Anastasio Auerbach, MD;  Location: [facility];  Service: Gynecology;  Laterality: N/A;  . Hysteroscopy    . Dilation and curettage of uterus    . Hardware removal Left 12/12/2013    Procedure:  LEFT PATELLA HARDWARE REMOVAL;  Surgeon: [name], MD;  Location: [facility];  Service: Orthopedics;  Laterality: Left;  . Knee arthroscopy Left 12/12/2013    Procedure: LEFT PATELLA ARTHROSCOPY KNEE WITH DEBRIDEMENT/SHAVING (CONDROPLASTY), LYSIS OF ADHESIONS;  Surgeon: [name], MD;  Location: [facility];  Service: Orthopedics;  Laterality: Left;    Allergies  Allergies  Allergen Reactions  . Aleve [Naproxen Sodium] Other (See Comments)    Severe abdominal pain  . Aspirin Other (See Comments)    REACTION: severe abdominal pain and excessive salavation   . Atorvastatin Other (See Comments)    myalgia  . Adhesive [Tape] Rash    Looks burned  . Clarithromycin Rash      Says allergic to "mycins"  . Penicillins Swelling and Rash    Hands swelling  . Zofran [Ondansetron Hcl] Other (See Comments)    Headache     HPI  A very pleasant 73 year old female who is coming for concern of dyspnea on exertion and fatigue. The patient is generally very active she exercises twice a week in the gym and walks almost daily. She has noticed recently that occasionally she gets short of breath on exertion but overall she feels very tired. The patient has no prior history of cardiac disease. She has never had an echocardiogram. She has known history of hyperlipidemia there was recently started on Crestor 5 mg daily. She reports muscle cramping and is scheduled for a blood work next week. The patient denies any chest pain, palpitation or recent syncope. She has smoked 30 years ago. Her father had myocardial infarction at age of 39 and underwent quadruple bypass and AAA repair at age of 66. Her daughter has a pacemaker for unknown reason.  06/17/2014 - negative cardiac cath - normal coronaries despite abnormal stress test. No chest pain, no SOB. Continues to have muscle cramping despite normal LFTS. She stopped using crestor. No palpitations, LE edema, orthopnea or syncope.   Home Medications  Prior to Admission medications   Medication Sig Start Date End Date Taking? Authorizing Provider  acetaminophen (TYLENOL) 325 MG tablet Take  325 mg by mouth every 6 (six) hours as needed.   Yes Historical Provider, MD  Calcium Citrate-Vitamin D (CALCIUM CITRATE + PO) Take 4 capsules by mouth daily. Contain 800mg  calcium citrate and other vitamins D,C, K, Mg, zinc, copper   Yes Historical Provider, MD  Cholecalciferol (VITAMIN D3) 5000 UNITS TABS Take 1 tablet by mouth daily.   Yes Historical Provider, MD  diphenhydramine-acetaminophen (TYLENOL PM) 25-500 MG TABS Take 1 tablet by mouth at bedtime as needed.   Yes Historical Provider, MD  hyoscyamine (LEVSIN, ANASPAZ) 0.125 MG tablet Take  0.125 mg by mouth every 4 (four) hours as needed for cramping.   Yes Historical Provider, MD  levothyroxine (SYNTHROID, LEVOTHROID) 112 MCG tablet Take 1 tablet (112 mcg total) by mouth daily. 06/29/12  Yes Lanice Shirts, MD  omeprazole (PRILOSEC OTC) 20 MG tablet Take 20 mg by mouth daily.   Yes Historical Provider, MD  Probiotic Product (PROBIOTIC PO) Take 1-2 capsules by mouth as needed.    Yes Historical Provider, MD  rosuvastatin (CRESTOR) 5 MG tablet Take 5 mg by mouth 2 (two) times a week. 03/06/14  Yes Philemon Kingdom, MD    Family History  Family History  Problem Relation Age of Onset  . Allergies Mother   . Heart disease Father   . Colon cancer Neg Hx     Social History  History   Social History  . Marital Status: Single    Spouse Name: N/A    Number of Children: 2  . Years of Education: N/A   Occupational History  . RESEARCH ASSIST     Retired    Social History Main Topics  . Smoking status: Former Smoker    Quit date: 12/10/1983  . Smokeless tobacco: Never Used  . Alcohol Use: Yes     Comment: OCC  . Drug Use: No  . Sexual Activity: No   Other Topics Concern  . Not on file   Social History Narrative   Daily caffeine      Review of Systems, as per HPI, otherwise negative General:  No chills, fever, night sweats or weight changes.  Cardiovascular:  No chest pain, dyspnea on exertion, edema, orthopnea, palpitations, paroxysmal nocturnal dyspnea. Dermatological: No rash, lesions/masses Respiratory: No cough, dyspnea Urologic: No hematuria, dysuria Abdominal:   No nausea, vomiting, diarrhea, bright red blood per rectum, melena, or hematemesis Neurologic:  No visual changes, wkns, changes in mental status. All other systems reviewed and are otherwise negative except as noted above.  Physical Exam  Blood pressure 122/68, pulse 74, height 5' (1.524 m), weight 165 lb (74.844 kg), SpO2 97.00%.  General: Pleasant, NAD Psych: Normal affect. Neuro:  Alert and oriented X 3. Moves all extremities spontaneously. HEENT: Normal  Neck: Supple without bruits or JVD. Lungs:  Resp regular and unlabored, CTA. Heart: RRR no s3, s4, or murmurs. Abdomen: Soft, non-tender, non-distended, BS + x 4.  Extremities: No clubbing, cyanosis or edema. DP/PT/Radials 2+ and equal bilaterally.  Labs:  No results found for this basename: CKTOTAL, CKMB, TROPONINI,  in the last 72 hours Lab Results  Component Value Date   WBC 6.7 04/18/2014   HGB 15.4* 04/18/2014   HCT 45.4 04/18/2014   MCV 92.7 04/18/2014   PLT 286.0 04/18/2014    Lab Results  Component Value Date   DDIMER 0.29 06/06/2014   No components found with this basename: POCBNP,     Component Value Date/Time   NA 139 04/18/2014 1032   K  3.9 04/18/2014 1032   CL 106 04/18/2014 1032   CO2 23 04/18/2014 1032   GLUCOSE 119* 04/18/2014 1032   BUN 13 04/18/2014 1032   CREATININE 0.8 04/18/2014 1032   CREATININE 0.64 02/19/2014 0943   CALCIUM 9.8 04/18/2014 1032   PROT 6.8 04/18/2014 1032   ALBUMIN 4.3 04/18/2014 1032   AST 31 04/18/2014 1032   ALT 33 04/18/2014 1032   ALKPHOS 85 04/18/2014 1032   BILITOT 0.7 04/18/2014 1032   GFRNONAA 84* 04/23/2013 1237   GFRAA >90 04/23/2013 1237   Lab Results  Component Value Date   CHOL 199 04/17/2014   HDL 49.90 04/17/2014   LDLCALC 123* 04/17/2014   TRIG 129.0 04/17/2014    Accessory Clinical Findings  Echocardiogram - none  ECG - 02/19/2014 - sinus bradycardia, diffuse negative T waves in inferior anterolateral leads  Left cardiac cath: LEFT VENTRICULOGRAM: Left ventricular angiogram was done in the 30 RAO projection and revealed normal cavity size and EF 60%.  IMPRESSIONS: 1. Normal coronary arteries  2. Normal left ventricular systolic function with EF 60% and normal hemodynamics  3. Dyspnea not likely related to cardiac etiology  RECOMMENDATION: Management per primary team with other considerations being pulmonary sources of dyspnea, deconditioning, and  sleep apnea..    Assessment & Plan  1. Dyspnea on exertion, fatigue - to gather with diffuse negative T waves in inferior and anterolateral leads this was very concerning for possible ischemia, there was abnormal stress test, but normal left cardiac cath.  2. Blood pressure - well controlled  3. Hyperlipidemia - recently started on Crestor 5 mg daily, patient is experiencing muscle cramping, we will switch to Livalo 2 mg daily, she was given 1 month free samples, if tolerated we will prescribe.  CMP in 4 weeks,   Follow up in 6 months with lipids and CMP.     Dorothy Spark, MD, [facility] 06/17/2014, 10:29 AM

## 2014-06-17 NOTE — Patient Instructions (Signed)
Your physician has recommended you make the following change in your medication:   DR NELSON GAVE YOU SAMPLES TO TAKE LIVALO 2 MG DAILY TO TRY FOR ONE MONTH   Your physician recommends that you return for lab work in: [place] ([place]) [place] wants you to follow-up in: [place] will receive a reminder letter in the mail two months in advance. If you don't receive a letter, please call our office to schedule the follow-up appointment.

## 2014-06-26 ENCOUNTER — Telehealth: Payer: Self-pay | Admitting: Internal Medicine

## 2014-06-26 MED ORDER — METRONIDAZOLE 500 MG PO TABS: ORAL_TABLET | ORAL | Status: DC

## 2014-06-26 MED ORDER — CIPROFLOXACIN HCL 500 MG PO TABS: ORAL_TABLET | ORAL | Status: DC

## 2014-06-26 NOTE — Telephone Encounter (Signed)
Spoke with patient and recommendations given. Rx sent. Scheduled OV with Tye Savoy, NP on 07/04/14 at 10:30 AM.

## 2014-06-26 NOTE — Telephone Encounter (Signed)
Patient calling to report diverticulitis flare. States 3 days ago, she started having constipation and left sided abdominal pain. The pain is at her waist area. She took laxatives and her medication for "spasms" without relief. She also states she has been on a low residue diet. She is under a lot of stress. States all the symptoms are her usual diverticulitis episodes and she would like medication for this. Please, advise.

## 2014-06-26 NOTE — Telephone Encounter (Signed)
Okay to begin Cipro 500 mg twice a day and Flagyl 500 mg twice a day 7 days. However, she needs to see an extender this week or next for proper continuity care. Thank you

## 2014-07-04 ENCOUNTER — Ambulatory Visit (INDEPENDENT_AMBULATORY_CARE_PROVIDER_SITE_OTHER): Payer: Medicare Other | Admitting: Nurse Practitioner

## 2014-07-04 ENCOUNTER — Encounter: Payer: Self-pay | Admitting: Nurse Practitioner

## 2014-07-04 VITALS — BP 118/72 | HR 68 | Ht 60.0 in | Wt 165.4 lb

## 2014-07-04 DIAGNOSIS — R1013 Epigastric pain: Secondary | ICD-10-CM

## 2014-07-04 DIAGNOSIS — K219 Gastro-esophageal reflux disease without esophagitis: Secondary | ICD-10-CM | POA: Diagnosis not present

## 2014-07-04 MED ORDER — SUCRALFATE 1 G PO TABS: ORAL_TABLET | ORAL | Status: DC

## 2014-07-04 MED ORDER — HYOSCYAMINE SULFATE 0.125 MG SL SUBL: 0.1250 mg | SUBLINGUAL_TABLET | SUBLINGUAL | Status: DC | PRN

## 2014-07-04 NOTE — Patient Instructions (Addendum)
We have sent the following medications to your pharmacy for you to pick up at your convenience: Carafate 1 gram tablet, take one tablet thirty minutes after breakfast, lunch and dinner  Please call back in ten days with an update on how you are feeling, you can speak to [place]

## 2014-07-06 ENCOUNTER — Encounter: Payer: Self-pay | Admitting: Nurse Practitioner

## 2014-07-06 NOTE — Progress Notes (Signed)
     History of Present Illness:   Patient is a 73 year old female known to Dr Henrene Pastor for history of GERD, dysphagia, diverticular disease and adenomatous colon polyps. We recently treated her empirically for an episode of diverticulitis. Patient has just completed the antibiotics and her lower abdominal pain has resolved. While on antibiotics patient developed severe indigestion. No associated SOB Patient stopped her PPI while on antibiotics as she was concerned about taking the two medications together. She has since resumed PPI, no further severe pain just some "left over irritation".   Current Medications, Allergies, Past Medical History, Past Surgical History, Family History and Social History were reviewed in [employer] record.  Physical Exam: General: Pleasant, well developed , white female in no acute distress Head: Normocephalic and atraumatic Eyes:  sclerae anicteric, conjunctiva pink  Ears: Normal auditory acuity Lungs: Clear throughout to auscultation Heart: Regular rate and rhythm Abdomen: Soft, non distended, non-tender. No masses, no hepatomegaly. Normal bowel sounds Musculoskeletal: Symmetrical with no gross deformities  Extremities: No edema  Neurological: Alert oriented x 4, grossly nonfocal Psychological:  Alert and cooperative. Normal mood and affect  Assessment and Recommendations:  1. Lower abdominal pain, probably recurrent diverticulitis. She has just completed course of antibiotics and abdominal pain has resolved..   2. Epigastric / chest pain. Patient stopped PPI for a few days while taking antibiotics for diverticulitis. Now back on PPI, no severe pain just residual "irritation". No odynophagia or dysphagia.  Will try Carafate 3 times a day for 10 days. Continue daily PPI. Patient will call if she does not continue to improve

## 2014-07-07 DIAGNOSIS — R1013 Epigastric pain: Secondary | ICD-10-CM | POA: Insufficient documentation

## 2014-07-07 NOTE — Progress Notes (Signed)
Agree with initial assessment and plans 

## 2014-07-10 DIAGNOSIS — H04123 Dry eye syndrome of bilateral lacrimal glands: Secondary | ICD-10-CM | POA: Diagnosis not present

## 2014-07-10 DIAGNOSIS — H0289 Other specified disorders of eyelid: Secondary | ICD-10-CM | POA: Diagnosis not present

## 2014-07-10 DIAGNOSIS — D2312 Other benign neoplasm of skin of left eyelid, including canthus: Secondary | ICD-10-CM | POA: Diagnosis not present

## 2014-07-10 DIAGNOSIS — H10413 Chronic giant papillary conjunctivitis, bilateral: Secondary | ICD-10-CM | POA: Diagnosis not present

## 2014-07-14 ENCOUNTER — Telehealth: Payer: Self-pay

## 2014-07-14 DIAGNOSIS — H01023 Squamous blepharitis right eye, unspecified eyelid: Secondary | ICD-10-CM | POA: Diagnosis not present

## 2014-07-14 DIAGNOSIS — H04123 Dry eye syndrome of bilateral lacrimal glands: Secondary | ICD-10-CM | POA: Diagnosis not present

## 2014-07-14 NOTE — Telephone Encounter (Signed)
Ivis [phone]  Marilyn Rivas is wanting [place] to do research and see when she last got her PNA shot and see if it is time for a new one. I do not see anything on when a last one was.

## 2014-07-14 NOTE — Telephone Encounter (Signed)
Marilyn Rivas is aware that we do not have record of her last Pneumonia Vaccine. -eh

## 2014-07-15 ENCOUNTER — Other Ambulatory Visit (INDEPENDENT_AMBULATORY_CARE_PROVIDER_SITE_OTHER): Payer: Medicare Other | Admitting: *Deleted

## 2014-07-15 DIAGNOSIS — E782 Mixed hyperlipidemia: Secondary | ICD-10-CM

## 2014-07-16 LAB — LIPID PANEL
Cholesterol: 183 mg/dL (ref 0–200)
HDL: 50.7 mg/dL (ref >39.00)
LDL Cholesterol: 107 mg/dL — ABNORMAL HIGH (ref 0–99)
NonHDL: 132.3
Total CHOL/HDL Ratio: 4
Triglycerides: 126 mg/dL (ref 0.0–149.0)
VLDL: 25.2 mg/dL (ref 0.0–40.0)

## 2014-07-16 LAB — COMPREHENSIVE METABOLIC PANEL
ALT: 30 U/L (ref 0–35)
AST: 28 U/L (ref 0–37)
Albumin: 4.4 g/dL (ref 3.5–5.2)
Alkaline Phosphatase: 70 U/L (ref 39–117)
BUN: 9 mg/dL (ref 6–23)
CO2: 29 mEq/L (ref 19–32)
Calcium: 9.8 mg/dL (ref 8.4–10.5)
Chloride: 107 mEq/L (ref 96–112)
Creatinine, Ser: 0.7 mg/dL (ref 0.4–1.2)
GFR: 93.01 mL/min (ref >60.00)
Glucose, Bld: 85 mg/dL (ref 70–99)
Potassium: 4.7 mEq/L (ref 3.5–5.1)
Sodium: 142 mEq/L (ref 135–145)
Total Bilirubin: 0.9 mg/dL (ref 0.2–1.2)
Total Protein: 6.8 g/dL (ref 6.0–8.3)

## 2014-07-17 ENCOUNTER — Telehealth: Payer: Self-pay | Admitting: Cardiology

## 2014-07-17 MED ORDER — PITAVASTATIN CALCIUM 2 MG PO TABS: 2.0000 mg | ORAL_TABLET | Freq: Every day | ORAL | Status: DC

## 2014-07-17 NOTE — Telephone Encounter (Signed)
New problem   Pt has taken all her samples for Livalo 2mg  and now she wants a prescription [facility].

## 2014-07-17 NOTE — Telephone Encounter (Signed)
Informed the pt that we sent in refills for her Livalo (pitavastatin calcium) 2 mg po daily.  Confirmed the pharmacy of choice with the pt.  Pt gracious for all the assistance provided.

## 2014-07-18 ENCOUNTER — Telehealth: Payer: Self-pay | Admitting: Cardiology

## 2014-07-18 ENCOUNTER — Encounter: Payer: Self-pay | Admitting: Pulmonary Disease

## 2014-07-18 ENCOUNTER — Ambulatory Visit (INDEPENDENT_AMBULATORY_CARE_PROVIDER_SITE_OTHER): Payer: Medicare Other | Admitting: Pulmonary Disease

## 2014-07-18 VITALS — BP 122/84 | HR 57 | Ht 60.0 in | Wt 167.6 lb

## 2014-07-18 DIAGNOSIS — Z23 Encounter for immunization: Secondary | ICD-10-CM | POA: Diagnosis not present

## 2014-07-18 DIAGNOSIS — R0609 Other forms of dyspnea: Secondary | ICD-10-CM | POA: Diagnosis not present

## 2014-07-18 MED ORDER — ALBUTEROL SULFATE HFA 108 (90 BASE) MCG/ACT IN AERS: 2.0000 | INHALATION_SPRAY | Freq: Four times a day (QID) | RESPIRATORY_TRACT | Status: DC | PRN

## 2014-07-18 NOTE — Progress Notes (Signed)
   Subjective:    Patient ID: Marilyn Rivas, female    DOB: [birth year], 73 y.o.   MRN: [id number]  HPI  73 year old remote smoker for FU  of dyspnea on exertion. She reports class III dyspnea, ongoing for the last 4 months. She did not have this problem during the winter.  She is able to perform her pool exercises, but admits that this is not strenuous activity. She smoked about 30-pack-years, before quitting in 1985. She had patellar reconstruction surgery but does not appear to be limited by this.   Significant tests/ events  Echo-showed normal LV function. And RV pressures Treadmill stress imaging-was abnormal, hypertensive response, she was able to complete 8 minutes Cardiac catheterization-showed normal coronaries Spirometry showed moderate airway obstruction with a ratio of 47, FEV1 1.41-81% and FVC 3.0 L-131%. There was flattening of the expiratory limb, but this may have been due to poor effort. Chest x-ray 6/34/15 did not show infiltrates or effusions CT abdomen 02/2013 does not show any infiltrates at the lung bases. She desaturated on walking to 89 percent on the third lap.  Chief Complaint  Patient presents with  . Follow-up    F/U SOB; Prevnar shot; pt extremely hostile due to wait time.    She denies wheezing, chest colds, cough, orthopnea or paroxysmal nocturnal dyspnea She did not desaturate on walking 3 laps today Labs-d-dimer negative   Review of Systems neg for any significant sore throat, dysphagia, itching, sneezing, nasal congestion or excess/ purulent secretions, fever, chills, sweats, unintended wt loss, pleuritic or exertional cp, hempoptysis, orthopnea pnd or change in chronic leg swelling. Also denies presyncope, palpitations, heartburn, abdominal pain, nausea, vomiting, diarrhea or change in bowel or urinary habits, dysuria,hematuria, rash, arthralgias, visual complaints, headache, numbness weakness or ataxia.     Objective:   Physical Exam  Gen.  Pleasant, obese, in no distress ENT - no lesions, no post nasal drip Neck: No JVD, no thyromegaly, no carotid bruits Lungs: no use of accessory muscles, no dullness to percussion, decreased without rales or rhonchi  Cardiovascular: Rhythm regular, heart sounds  normal, no murmurs or gallops, no peripheral edema Musculoskeletal: No deformities, no cyanosis or clubbing , no tremors       Assessment & Plan:

## 2014-07-18 NOTE — Telephone Encounter (Signed)
New MSG    Insurance will not approve medication, dr

## 2014-07-18 NOTE — Patient Instructions (Signed)
Prevnar Can use albuterol inhaler for wheezing or shortness of breath

## 2014-07-18 NOTE — Telephone Encounter (Signed)
LMTCB to inform the pt that we have samples here to pick up of Livalo, until prior auth for this medication is complete and approved through her insurance.

## 2014-07-18 NOTE — Telephone Encounter (Signed)
Pt calling re insurance not covering [place] ? Spelling? Needs auth to [facility] [address], pls call pt when done [phone]

## 2014-07-18 NOTE — Telephone Encounter (Signed)
LMTCB at both numbers listed

## 2014-07-21 NOTE — Telephone Encounter (Signed)
Pt notified and very argumentative over the phone stating nobody has tried to contact her in regards to her Livalo prescription and needing authorization for this med.  Informed the pt that I tried calling her numerous times to inform her that we received the authorization paperwork from her pharmacy and to inform her that I was going to leave samples at the front desk when she notifies [place] of a good day to pick them up.  Pt states that "I cannot always answer the phone when you people call." Asked pt if she would still like for [place] to leave samples for her to pick up, and the pt stated "I have enough samples to last me for 2 months." Informed the pt that when her prior authorization forms are complete and approved, then we will notify her.  Informed the pt if she runs out of Livalo, to contact our office for more samples.  Pt verbalized understanding and agrees with this plan.

## 2014-07-21 NOTE — Assessment & Plan Note (Signed)
Unclear cause, would attribute to deconditioning at the current time.  Prevnar Since there is mild airway obstruction -Can use albuterol inhaler for wheezing or shortness of breath

## 2014-07-30 ENCOUNTER — Encounter: Payer: Self-pay | Admitting: Internal Medicine

## 2014-07-30 ENCOUNTER — Ambulatory Visit (INDEPENDENT_AMBULATORY_CARE_PROVIDER_SITE_OTHER): Payer: Medicare Other | Admitting: Internal Medicine

## 2014-07-30 VITALS — BP 129/80 | HR 84 | Temp 98.8°F | Resp 16 | Ht 60.0 in | Wt 166.0 lb

## 2014-07-30 DIAGNOSIS — H109 Unspecified conjunctivitis: Secondary | ICD-10-CM | POA: Diagnosis not present

## 2014-07-30 LAB — CBC WITH DIFFERENTIAL/PLATELET
BASOS PCT: 1 % (ref 0–1)
Basophils Absolute: 0.1 10*3/uL (ref 0.0–0.1)
EOS ABS: 0.3 10*3/uL (ref 0.0–0.7)
Eosinophils Relative: 4 % (ref 0–5)
HEMATOCRIT: 41.4 % (ref 36.0–46.0)
Hemoglobin: 15.2 g/dL — ABNORMAL HIGH (ref 12.0–15.0)
Lymphocytes Relative: 37 % (ref 12–46)
Lymphs Abs: 2.8 10*3/uL (ref 0.7–4.0)
MCH: 31.7 pg (ref 26.0–34.0)
MCHC: 36.7 g/dL — AB (ref 30.0–36.0)
MCV: 86.3 fL (ref 78.0–100.0)
MONO ABS: 0.8 10*3/uL (ref 0.1–1.0)
MPV: 9.7 fL (ref 9.4–12.4)
Monocytes Relative: 10 % (ref 3–12)
NEUTROS PCT: 48 % (ref 43–77)
Neutro Abs: 3.6 10*3/uL (ref 1.7–7.7)
Platelets: 271 10*3/uL (ref 150–400)
RBC: 4.8 MIL/uL (ref 3.87–5.11)
RDW: 13.1 % (ref 11.5–15.5)
WBC: 7.6 10*3/uL (ref 4.0–10.5)

## 2014-07-30 LAB — TSH: TSH: 0.737 u[IU]/mL (ref 0.350–4.500)

## 2014-07-30 LAB — RHEUMATOID FACTOR: Rhuematoid fact SerPl-aCnc: <10 IU/mL (ref <=14)

## 2014-07-30 NOTE — Progress Notes (Signed)
Subjective:    Patient ID: Marilyn Rivas, female    DOB: 10-30-1940, 73 y.o.   MRN: [id number]  HPI  Rick is here for acute visit   Bilateral red eyes present for 6 weeks.  She has seen opthalmologist 3 different occasions was told it was dry eyes.  Most recent visit one week ago with Dr. Jerline Pain and was given Lotemax and Doxycycline.   Also given tea tree drops but pt cannot tolerate these so is not using.  No double vision,  No pain,  Occasional blurring  itchilng off and on.   Denies rash, hives,  headache.   She tells me she has not had any blood testing for the eye problem and she has seen an allergist in the past but not in a while.  ?? Dr. Ishmael Holter  She also has seen a rheumatologist but does not care for her.      Allergies  Allergen Reactions  . Aleve [Naproxen Sodium] Other (See Comments)    Severe abdominal pain  . Aspirin Other (See Comments)    REACTION: severe abdominal pain and excessive salavation   . Atorvastatin Other (See Comments)    myalgia  . Crestor [Rosuvastatin] Other (See Comments)    Muscle aches  . Durezol [Difluprednate]     Eye redness    . Other     [place]o eye drops - causes eye redness  . Adhesive [Tape] Rash    Looks burned  . Clarithromycin Rash    Says allergic to "mycins"  . Penicillins Swelling and Rash    Hands swelling  . Zofran [Ondansetron Hcl] Other (See Comments)    Headache    Past Medical History  Diagnosis Date  . Thyroid disease   . Hyperlipidemia   . Menopause   . Osteopenia   . Diverticulosis   . Diverticulitis   . Hx of adenomatous colonic polyps   . Seasonal allergies   . Cataracts, both eyes   . GERD (gastroesophageal reflux disease)     HISTORY  . Arthritis   . Lichen sclerosus 08/2876    Biopsy proven   Past Surgical History  Procedure Laterality Date  . Appendectomy  1977  . Right oophorectomy  1977  . Patella reconstruction Left 1994  . Cervical fusion  1999    C5-7  . Lumbar disc surgery   2004    L5  . Cataract surgery Bilateral 06/2012  . Dilatation & curettage/hysteroscopy with trueclear N/A 05/02/2013    Procedure: DILATATION & CURETTAGE/HYSTEROSCOPY WITH TRUECLEAR ;  Surgeon: Anastasio Auerbach, MD;  Location: [facility];  Service: Gynecology;  Laterality: N/A;  . Hysteroscopy    . Dilation and curettage of uterus    . Hardware removal Left 12/12/2013    Procedure:  LEFT PATELLA HARDWARE REMOVAL;  Surgeon: [name], MD;  Location: [facility];  Service: Orthopedics;  Laterality: Left;  . Knee arthroscopy Left 12/12/2013    Procedure: LEFT PATELLA ARTHROSCOPY KNEE WITH DEBRIDEMENT/SHAVING (CONDROPLASTY), LYSIS OF ADHESIONS;  Surgeon: [name], MD;  Location: [facility];  Service: Orthopedics;  Laterality: Left;   History   Social History  . Marital Status: Single    Spouse Name: N/A    Number of Children: 2  . Years of Education: N/A   Occupational History  . RESEARCH ASSIST     Retired    Social History Main Topics  . Smoking status: Former Audiological scientist  date: 12/10/1983  . Smokeless tobacco: Never Used  . Alcohol Use: Yes     Comment: OCC  . Drug Use: No  . Sexual Activity: No   Other Topics Concern  . Not on file   Social History Narrative   Daily caffeine    Family History  Problem Relation Age of Onset  . Allergies Mother   . Heart disease Father   . Colon cancer Neg Hx    Patient Active Problem List   Diagnosis Date Noted  . Abdominal pain, epigastric 07/07/2014  . Fatigue 04/08/2014  . Dyspnea on exertion 04/08/2014  . Hyperlipidemia 04/08/2014  . Abdominal pain, left lower quadrant 05/20/2013  . Nausea alone 03/06/2013  . LLQ pain 03/05/2013  . Hyperglycemia 10/29/2012  . Urge incontinence of urine 10/29/2012  . Osteoporosis 05/13/2012  . Basal cell cancer 12/17/2011  . Sinus bradycardia by electrocardiogram 11/15/2011  . Hypothyroidism 09/07/2011  . DJD (degenerative joint disease) 09/07/2011    . Atrophic vaginitis 09/07/2011  . Radiculopathy of lumbar region 09/07/2011  . GERD 09/08/2010  . DIVERTICULITIS, COLON 09/08/2010  . CONSTIPATION 10/09/2009  . PERSONAL HX COLONIC POLYPS 10/09/2009  . Mixed hyperlipidemia 04/10/2009   Current Outpatient Prescriptions on File Prior to Visit  Medication Sig Dispense Refill  . acetaminophen (TYLENOL) 325 MG tablet Take 325 mg by mouth every 6 (six) hours as needed for mild pain.     . Cholecalciferol (VITAMIN D3) 5000 UNITS TABS Take 1 tablet by mouth daily.    . diphenhydramine-acetaminophen (TYLENOL PM) 25-500 MG TABS Take 1 tablet by mouth at bedtime as needed (for sleep).     Marland Kitchen doxycycline (VIBRAMYCIN) 100 MG capsule Take 100 mg by mouth 2 (two) times daily.    . hyoscyamine (LEVSIN/SL) 0.125 MG SL tablet Place 1 tablet (0.125 mg total) under the tongue every 4 (four) hours as needed. 120 tablet 2  . levothyroxine (SYNTHROID, LEVOTHROID) 112 MCG tablet Take 1 tablet (112 mcg total) by mouth daily. (Patient taking differently: Take 112 mcg by mouth daily. USES NAME BRAND ONLY) 90 tablet 1  . omeprazole (PRILOSEC OTC) 20 MG tablet Take 20 mg by mouth daily.    . Pitavastatin Calcium 2 MG TABS Take 1 tablet (2 mg total) by mouth daily. 90 tablet 3  . Probiotic Product (PROBIOTIC PO) Take 1-2 capsules by mouth as needed (for GI support).     Marland Kitchen albuterol (PROVENTIL HFA;VENTOLIN HFA) 108 (90 BASE) MCG/ACT inhaler Inhale 2 puffs into the lungs every 6 (six) hours as needed for wheezing or shortness of breath. (Patient not taking: Reported on 07/30/2014) 1 Inhaler 6  . Calcium Citrate-Vitamin D (CALCIUM CITRATE + PO) Take 4 capsules by mouth daily. Contain 800mg  calcium citrate and other vitamins D,C, K, Mg, zinc, copper     No current facility-administered medications on file prior to visit.      .  Review of Systems See HPI    Objective:   Physical Exam Physical Exam  Nursing note and vitals reviewed.  Constitutional: She is  oriented to person, place, and time. She appears well-developed and well-nourished.  HENT:  Head: Normocephalic and atraumatic.  Eyes bilateral conjunctivisit  No crusting  Cardiovascular: Normal rate and regular rhythm. Exam reveals no gallop and no friction rub.  No murmur heard.  Pulmonary/Chest: Breath sounds normal. She has no wheezes. She has no rales.  Neurological: She is alert and oriented to person, place, and time.  Skin: Skin is warm and dry.  Psychiatric: She has a normal mood and affect. Her behavior is normal.         Assessment & Plan:  Bilateral conjunctivitis :  Will check auto-immune profile , CBC   Will set up with allergy .   Further management based on resulst   Continue Lotemax doxycycline per opthalmology instructions

## 2014-07-30 NOTE — Patient Instructions (Signed)
To lab  Will refer to allergy

## 2014-07-31 ENCOUNTER — Telehealth: Payer: Self-pay | Admitting: Cardiology

## 2014-07-31 ENCOUNTER — Other Ambulatory Visit: Payer: Self-pay | Admitting: *Deleted

## 2014-07-31 ENCOUNTER — Telehealth: Payer: Self-pay | Admitting: *Deleted

## 2014-07-31 DIAGNOSIS — J3089 Other allergic rhinitis: Secondary | ICD-10-CM | POA: Diagnosis not present

## 2014-07-31 DIAGNOSIS — H1045 Other chronic allergic conjunctivitis: Secondary | ICD-10-CM | POA: Diagnosis not present

## 2014-07-31 LAB — ANA: Anti Nuclear Antibody(ANA): NEGATIVE

## 2014-07-31 LAB — SEDIMENTATION RATE: Sed Rate: 5 mm/hr (ref 0–22)

## 2014-07-31 NOTE — Progress Notes (Signed)
Smita is aware of her lab results-eh

## 2014-07-31 NOTE — Telephone Encounter (Signed)
PA sent for Livalo via CoverMyMeds.

## 2014-07-31 NOTE — Telephone Encounter (Signed)
New message      Did you get the livalo approved?

## 2014-07-31 NOTE — Telephone Encounter (Signed)
Informed the pt that we contacted her pharmacy [facility] to check on the status of her prior auth approval of Livalo, and [facility] states it was not approved and they will fax our office paperwork to fill out and send to the insurance company.  Informed the pt that our prior authorization nurse will receive the fax and work on this accordingly, and then we will notify the pt with the approval status.  Asked the pt if she needs any samples while the prior auth is being processed and the pt states she has enough samples that will last her another month.  Informed pt to contact our office if she needs samples until this med is approved.  Pt verbalized understanding and agrees with this plan.

## 2014-08-01 DIAGNOSIS — H10433 Chronic follicular conjunctivitis, bilateral: Secondary | ICD-10-CM | POA: Diagnosis not present

## 2014-08-04 DIAGNOSIS — H0288B Meibomian gland dysfunction left eye, upper and lower eyelids: Secondary | ICD-10-CM

## 2014-08-04 DIAGNOSIS — H10433 Chronic follicular conjunctivitis, bilateral: Secondary | ICD-10-CM | POA: Diagnosis not present

## 2014-08-04 DIAGNOSIS — H16223 Keratoconjunctivitis sicca, not specified as Sjogren's, bilateral: Secondary | ICD-10-CM | POA: Diagnosis not present

## 2014-08-04 DIAGNOSIS — H01001 Unspecified blepharitis right upper eyelid: Secondary | ICD-10-CM | POA: Diagnosis not present

## 2014-08-04 DIAGNOSIS — H01002 Unspecified blepharitis right lower eyelid: Secondary | ICD-10-CM | POA: Diagnosis not present

## 2014-08-04 DIAGNOSIS — H0288A Meibomian gland dysfunction right eye, upper and lower eyelids: Secondary | ICD-10-CM | POA: Insufficient documentation

## 2014-08-05 NOTE — Telephone Encounter (Signed)
PA approved for Livalo, non-formulary through 08/29/2015. Patient called and a message left that medication would be ready at pharmacy this afternoon.

## 2014-08-07 ENCOUNTER — Encounter (HOSPITAL_COMMUNITY): Payer: Self-pay | Admitting: Interventional Cardiology

## 2014-08-11 ENCOUNTER — Encounter: Payer: Self-pay | Admitting: *Deleted

## 2014-08-11 DIAGNOSIS — H01001 Unspecified blepharitis right upper eyelid: Secondary | ICD-10-CM | POA: Diagnosis not present

## 2014-08-11 DIAGNOSIS — H10433 Chronic follicular conjunctivitis, bilateral: Secondary | ICD-10-CM | POA: Diagnosis not present

## 2014-08-11 DIAGNOSIS — H16223 Keratoconjunctivitis sicca, not specified as Sjogren's, bilateral: Secondary | ICD-10-CM | POA: Diagnosis not present

## 2014-08-11 DIAGNOSIS — H01002 Unspecified blepharitis right lower eyelid: Secondary | ICD-10-CM | POA: Diagnosis not present

## 2014-09-08 IMAGING — CT CT ABD-PELV W/ CM
2 of 4 series · 15 of 42 positions shown, 19 images · IV contrast (Omnipaque 300)
Comparison: None.

CLINICAL DATA: Abdominal pain, nausea, vomiting, history of
diverticulitis

CT ABDOMEN AND PELVIS WITH CONTRAST
TECHNIQUE: Multidetector CT imaging of the abdomen and pelvis was
performed following the standard protocol during bolus
administration of intravenous contrast.
Contrast: 100mL OMNIPAQUE IOHEXOL 300 MG/ML  SOLN

[Series 3: abd/ pel 5mm · axial · 0.73mm/px · z∈[-378,+17]mm · 12 of 89 slices shown, 16 images]
[im 5/89  soft-tissue]
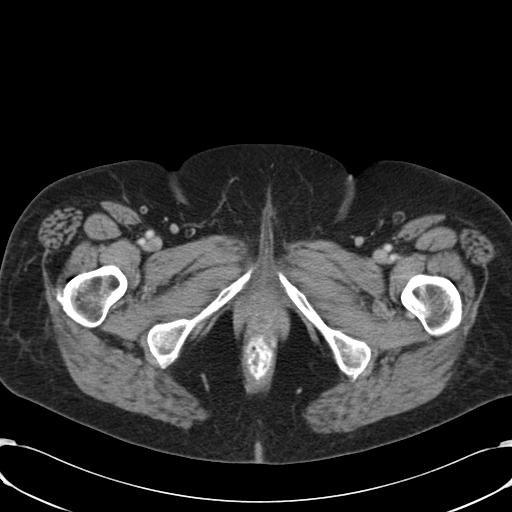
[im 5/89  bone]
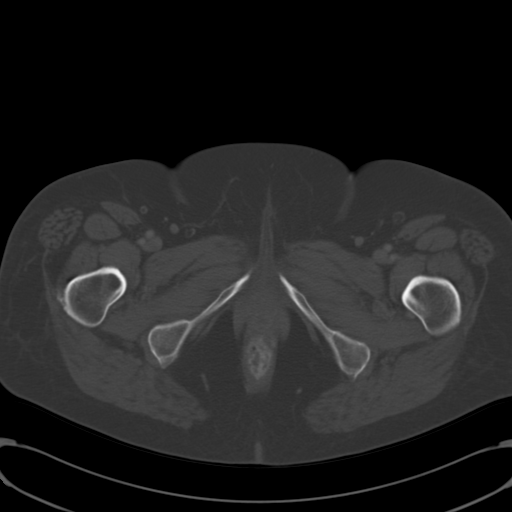
[im 14/89  soft-tissue]
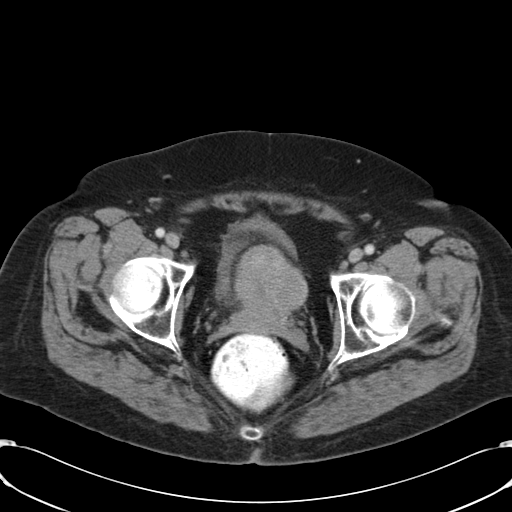
[im 24/89  soft-tissue]
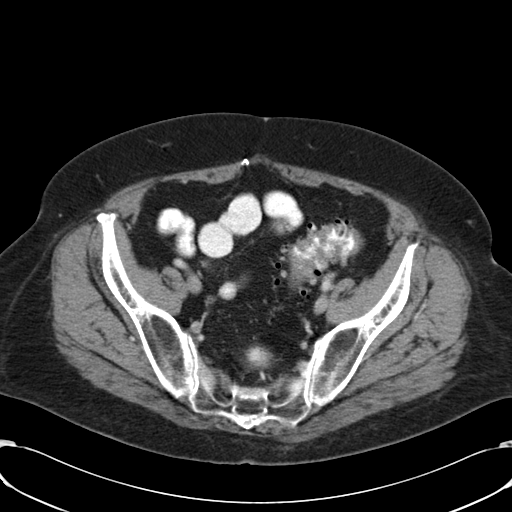
[im 33/89  soft-tissue]
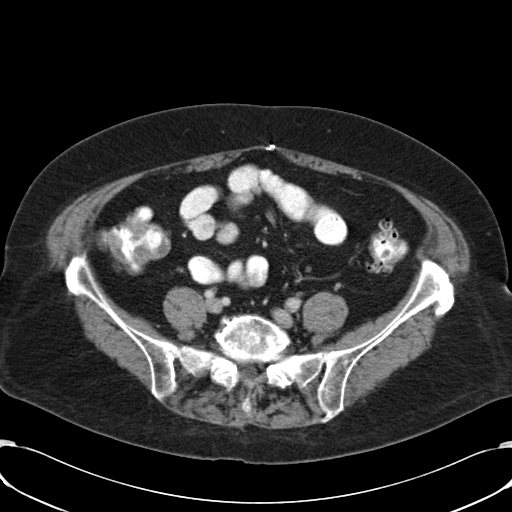
[im 42/89  soft-tissue]
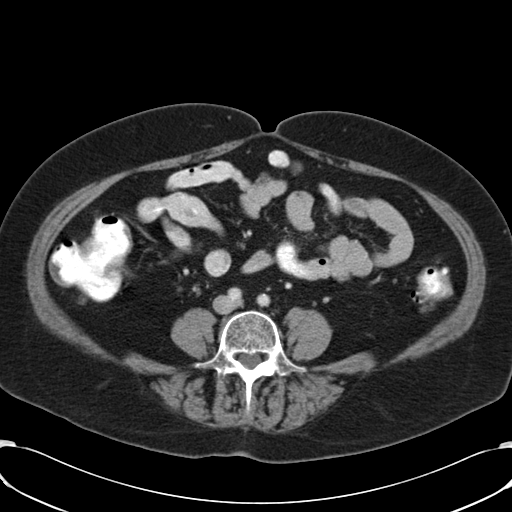
[im 47/89  soft-tissue]
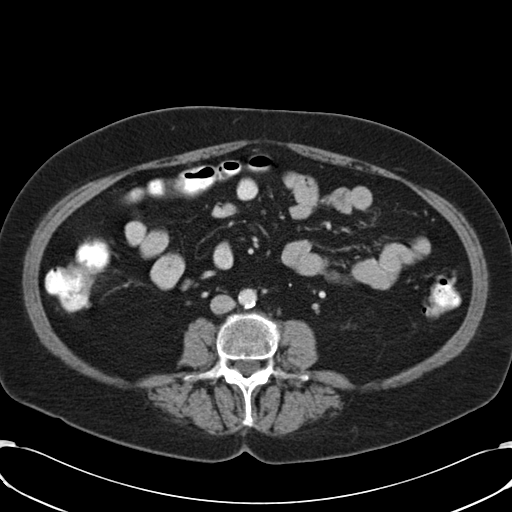
[im 56/89  soft-tissue]
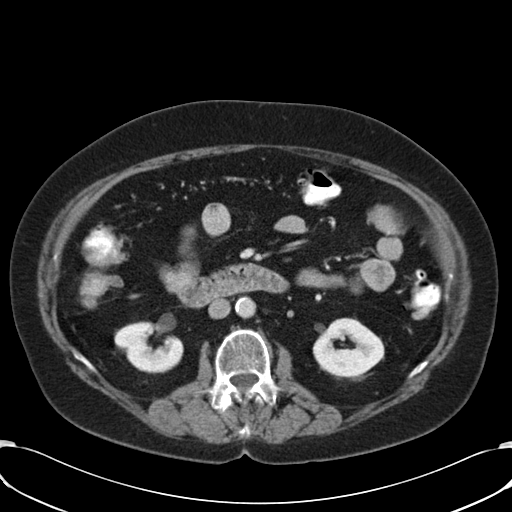
[im 65/89  soft-tissue]
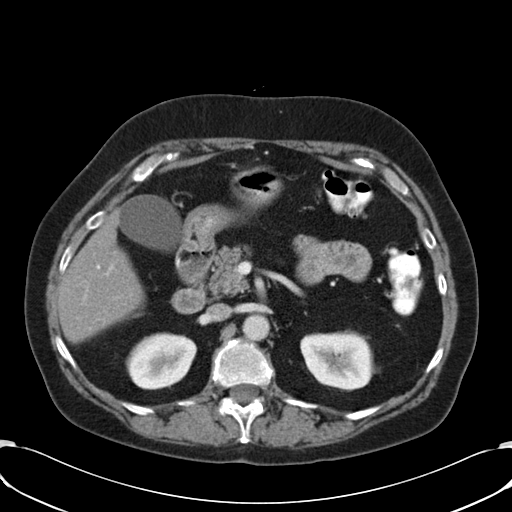
[im 70/89  lung]
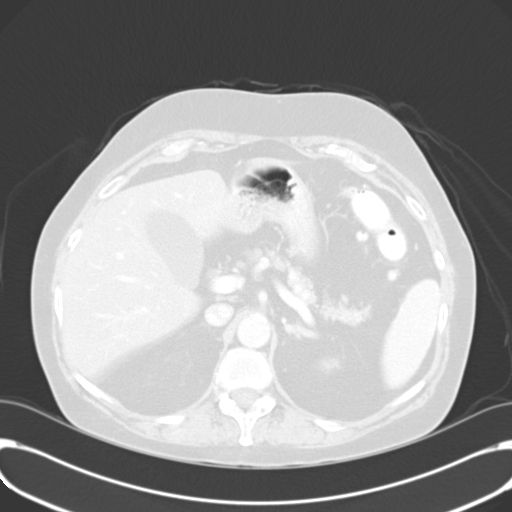
[im 75/89  soft-tissue]
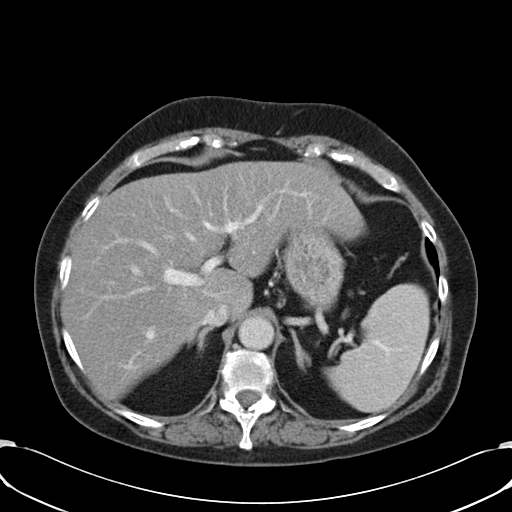
[im 75/89  lung]
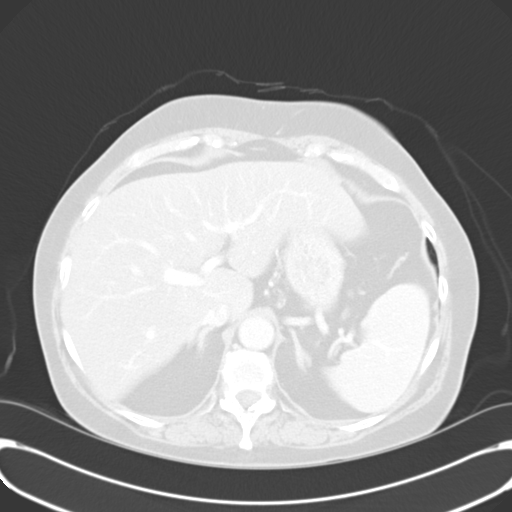
[im 75/89  bone]
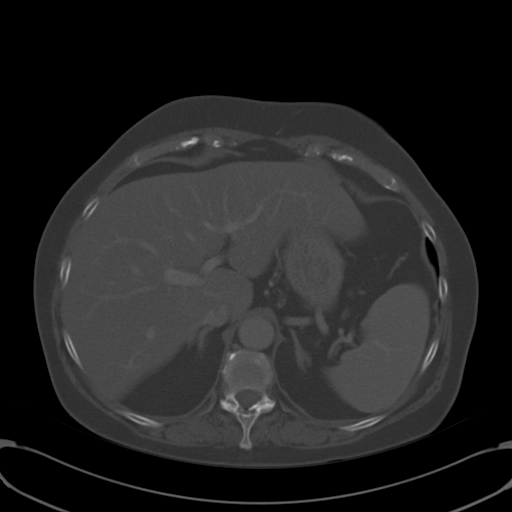
[im 79/89  lung]
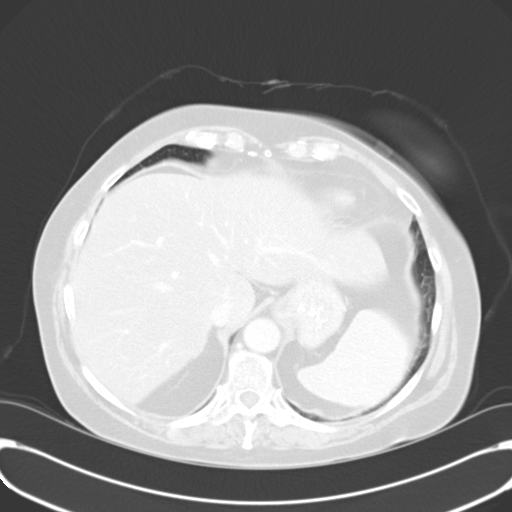
[im 84/89  soft-tissue]
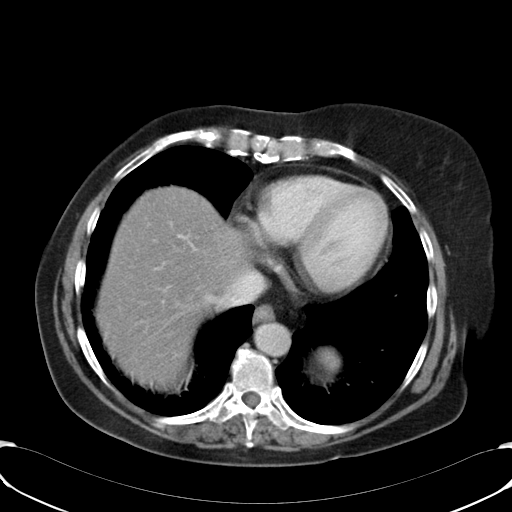
[im 84/89  lung]
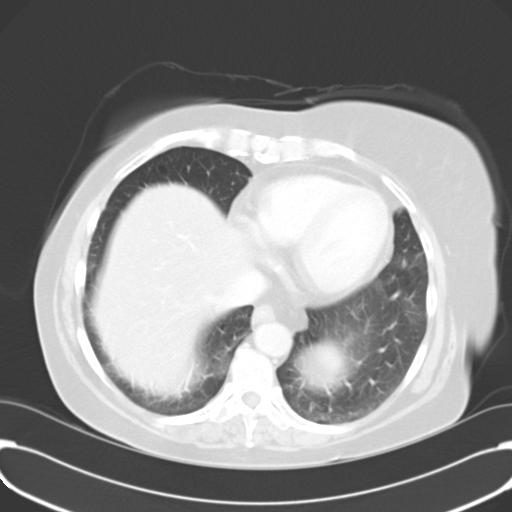

[Series 602: cor · coronal · 0.89mm/px · 3 of 71 slices shown]
[im 24/71  soft-tissue]
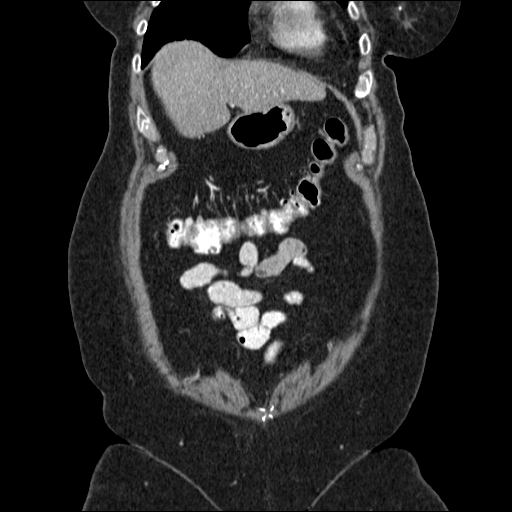
[im 32/71  soft-tissue]
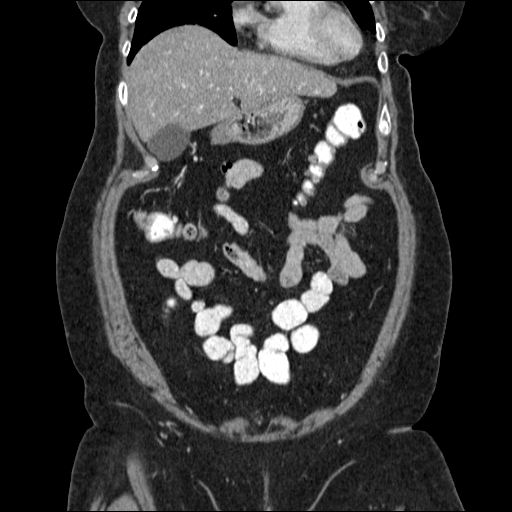
[im 39/71  soft-tissue]
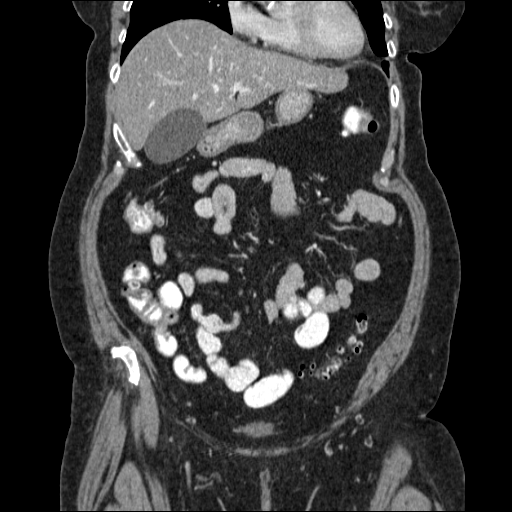

[15 of 42 positions shown; findings below may reference images not displayed]

FINDINGS: The lung bases are clear.  No lower lobe pneumonia.
Normal heart size.  No pericardial or pleural effusion.  No
significant hiatal hernia.  Lower thoracic degenerative change
noted with anterior lateral osteophytes.

Abdomen:  Liver, gallbladder, biliary system, pancreas, spleen,
adrenal glands, and kidneys are within normal limits for age and
demonstrate no acute process.

No abdominal free fluid, fluid collection, hemorrhage, abscess, or
adenopathy.

Aortic atherosclerosis without aneurysm or acute process.

Negative for bowel obstruction, dilatation, ileus pattern, or free
air.

Diffuse diverticulosis of the colon, most pronounced in the sigmoid
region.  No definite CT evidence of acute diverticulitis.

Pelvis:  No pelvic free fluid, fluid collection, hemorrhage,
abscess, adenopathy, inguinal abnormality, or hernia.  Urinary
bladder is collapsed.  Uterus demonstrates a left-sided uterine
wall round mass measuring 3.5 x 2.8 cm, image 74 suspicious for
uterine fibroid.  Left ovary appears normal in size.  Right ovary
not visualized compatible with prior right nephrectomy.  Appendix
not visualized.

Degenerative changes of the lumbar spine diffusely.  Bones appear
osteopenic.
IMPRESSION: No acute intra-abdominal or pelvic process.

Diffuse colonic diverticulosis

Aortic atherosclerosis

3.5 cm left uterine lesion, probable fibroid.  Given the patient's
post menopausal age, recommend follow-up non emergent pelvic
ultrasound for further evaluation.

## 2014-09-16 DIAGNOSIS — H01001 Unspecified blepharitis right upper eyelid: Secondary | ICD-10-CM | POA: Diagnosis not present

## 2014-09-16 DIAGNOSIS — H01002 Unspecified blepharitis right lower eyelid: Secondary | ICD-10-CM | POA: Diagnosis not present

## 2014-09-16 DIAGNOSIS — H10433 Chronic follicular conjunctivitis, bilateral: Secondary | ICD-10-CM | POA: Diagnosis not present

## 2014-09-16 DIAGNOSIS — H16223 Keratoconjunctivitis sicca, not specified as Sjogren's, bilateral: Secondary | ICD-10-CM | POA: Diagnosis not present

## 2014-09-25 ENCOUNTER — Ambulatory Visit (INDEPENDENT_AMBULATORY_CARE_PROVIDER_SITE_OTHER): Payer: Medicare Other | Admitting: Women's Health

## 2014-09-25 ENCOUNTER — Encounter: Payer: Self-pay | Admitting: Women's Health

## 2014-09-25 VITALS — BP 126/80 | Ht 60.0 in | Wt 166.0 lb

## 2014-09-25 DIAGNOSIS — N898 Other specified noninflammatory disorders of vagina: Secondary | ICD-10-CM | POA: Diagnosis not present

## 2014-09-25 DIAGNOSIS — R350 Frequency of micturition: Secondary | ICD-10-CM | POA: Diagnosis not present

## 2014-09-25 LAB — URINALYSIS W MICROSCOPIC + REFLEX CULTURE
Bilirubin Urine: NEGATIVE
GLUCOSE, UA: NEGATIVE mg/dL
HGB URINE DIPSTICK: NEGATIVE
Ketones, ur: NEGATIVE mg/dL
Leukocytes, UA: NEGATIVE
NITRITE: NEGATIVE
PH: 7 (ref 5.0–8.0)
PROTEIN: NEGATIVE mg/dL
Specific Gravity, Urine: 1.01 (ref 1.005–1.030)
Urobilinogen, UA: 0.2 mg/dL (ref 0.0–1.0)

## 2014-09-25 LAB — WET PREP FOR TRICH, YEAST, CLUE
Clue Cells Wet Prep HPF POC: NONE SEEN
TRICH WET PREP: NONE SEEN
Yeast Wet Prep HPF POC: NONE SEEN

## 2014-09-25 NOTE — Progress Notes (Signed)
Patient ID: Marilyn Rivas, female   DOB: 05-Jun-1941, 74 y.o.   MRN: [id number] Presents with complaint of increased fatigue, urinary frequency with urgency, vaginal irritation with mild itching and questionable odor. Denies incontinence, abdominal pain, fever, reports increased situational family stress. Hypothyroid TSH 0.7 one month ago. Not sexually active.  Exam: Appears well. Abdomen soft, obese, nontender. External genitalia minimal erythema at introitus, wet prep with Q-tip negative. No odor noted. UA: Negative  Mild vaginal irritation  Plan: Mycolog ointment twice daily small amount to introitus. (Has Rx at home ) Keep dry, loose clothing. Urine culture pending. Keep scheduled follow-up with primary care. Encouraged to increase rest.

## 2014-09-26 LAB — URINE CULTURE

## 2014-10-07 ENCOUNTER — Ambulatory Visit (INDEPENDENT_AMBULATORY_CARE_PROVIDER_SITE_OTHER): Payer: Medicare Other | Admitting: Internal Medicine

## 2014-10-07 ENCOUNTER — Encounter: Payer: Self-pay | Admitting: Internal Medicine

## 2014-10-07 VITALS — BP 136/70 | HR 53 | Resp 16 | Wt 166.0 lb

## 2014-10-07 DIAGNOSIS — I10 Essential (primary) hypertension: Secondary | ICD-10-CM | POA: Diagnosis not present

## 2014-10-07 DIAGNOSIS — E785 Hyperlipidemia, unspecified: Secondary | ICD-10-CM | POA: Diagnosis not present

## 2014-10-07 DIAGNOSIS — R5383 Other fatigue: Secondary | ICD-10-CM

## 2014-10-07 LAB — COMPREHENSIVE METABOLIC PANEL
ALT: 33 U/L (ref 0–35)
AST: 32 U/L (ref 0–37)
Albumin: 4.4 g/dL (ref 3.5–5.2)
Alkaline Phosphatase: 75 U/L (ref 39–117)
BUN: 7 mg/dL (ref 6–23)
CALCIUM: 9.9 mg/dL (ref 8.4–10.5)
CHLORIDE: 105 meq/L (ref 96–112)
CO2: 27 mEq/L (ref 19–32)
CREATININE: 0.64 mg/dL (ref 0.50–1.10)
Glucose, Bld: 84 mg/dL (ref 70–99)
POTASSIUM: 4.3 meq/L (ref 3.5–5.3)
Sodium: 141 mEq/L (ref 135–145)
Total Bilirubin: 0.7 mg/dL (ref 0.2–1.2)
Total Protein: 6.7 g/dL (ref 6.0–8.3)

## 2014-10-07 LAB — CBC WITH DIFFERENTIAL/PLATELET
BASOS ABS: 0.1 10*3/uL (ref 0.0–0.1)
BASOS PCT: 1 % (ref 0–1)
EOS ABS: 0.3 10*3/uL (ref 0.0–0.7)
Eosinophils Relative: 4 % (ref 0–5)
HCT: 43.7 % (ref 36.0–46.0)
Hemoglobin: 15.3 g/dL — ABNORMAL HIGH (ref 12.0–15.0)
Lymphocytes Relative: 34 % (ref 12–46)
Lymphs Abs: 2.4 10*3/uL (ref 0.7–4.0)
MCH: 31.5 pg (ref 26.0–34.0)
MCHC: 35 g/dL (ref 30.0–36.0)
MCV: 89.9 fL (ref 78.0–100.0)
MPV: 9.5 fL (ref 8.6–12.4)
Monocytes Absolute: 0.7 10*3/uL (ref 0.1–1.0)
Monocytes Relative: 10 % (ref 3–12)
NEUTROS PCT: 51 % (ref 43–77)
Neutro Abs: 3.6 10*3/uL (ref 1.7–7.7)
Platelets: 285 10*3/uL (ref 150–400)
RBC: 4.86 MIL/uL (ref 3.87–5.11)
RDW: 12.9 % (ref 11.5–15.5)
WBC: 7.1 10*3/uL (ref 4.0–10.5)

## 2014-10-07 NOTE — Progress Notes (Signed)
Subjective:    Patient ID: Marilyn Rivas, female    DOB: 1941-06-26, 74 y.o.   MRN: [id number]  HPI 06/2014 pulmonary note             Dyspnea on exertion - Rigoberto Noel, MD at 07/21/2014 1:04 PM     Status: Written Related Problem: Dyspnea on exertion   Expand All Collapse All   Unclear cause, would attribute to deconditioning at the current time.  Prevnar Since there is mild airway obstruction -Can use albuterol inhaler for wheezing or shortness of breath            01/28/ gyn note  Expand All Collapse All   Patient ID: Marilyn Rivas, female DOB: March 24, 1941, 74 y.o. MRN: [id number] Presents with complaint of increased fatigue, urinary frequency with urgency, vaginal irritation with mild itching and questionable odor. Denies incontinence, abdominal pain, fever, reports increased situational family stress. Hypothyroid TSH 0.7 one month ago. Not sexually active.  Exam: Appears well. Abdomen soft, obese, nontender. External genitalia minimal erythema at introitus, wet prep with Q-tip negative. No odor noted. UA: Negative  Mild vaginal irritation  Plan: Mycolog ointment twice daily small amount to introitus. (Has Rx at home ) Keep dry, loose clothing. Urine culture pending. Keep scheduled follow-up with primary care. Encouraged to increase rest.      TODAY:  Acute visit     Excessive fatigue  Sleeping a lot.  Under lots of stress .  Daughter and 50 yo grandson moved in with pt few months back  "daughter is lazy" and this brings lots of stress  Recently stopped statin due to leg cramps  Did see Dr. Margaretmary Dys at [facility] for eye redness and is doing much better   Allergies  Allergen Reactions  . Aleve [Naproxen Sodium] Other (See Comments)    Severe abdominal pain  . Aspirin Other (See Comments)    REACTION: severe abdominal pain and excessive salavation   . Atorvastatin Other (See Comments)    myalgia  . Crestor  [Rosuvastatin] Other (See Comments)    Muscle aches  . Durezol [Difluprednate]     Eye redness    . Other     Paseo eye drops - causes eye redness  . Adhesive [Tape] Rash    Looks burned  . Clarithromycin Rash    Says allergic to "mycins"  . Penicillins Swelling and Rash    Hands swelling  . Zofran [Ondansetron Hcl] Other (See Comments)    Headache    Past Medical History  Diagnosis Date  . Thyroid disease   . Hyperlipidemia   . Menopause   . Osteopenia   . Diverticulosis   . Diverticulitis   . Hx of adenomatous colonic polyps   . Seasonal allergies   . Cataracts, both eyes   . GERD (gastroesophageal reflux disease)     HISTORY  . Arthritis   . Lichen sclerosus 02/4943    Biopsy proven   Past Surgical History  Procedure Laterality Date  . Appendectomy  1977  . Right oophorectomy  1977  . Patella reconstruction Left 1994  . Cervical fusion  1999    C5-7  . Lumbar disc surgery  2004    L5  . Cataract surgery Bilateral 06/2012  . Dilatation & curettage/hysteroscopy with trueclear N/A 05/02/2013    Procedure: DILATATION & CURETTAGE/HYSTEROSCOPY WITH TRUECLEAR ;  Surgeon: Anastasio Auerbach, MD;  Location: [facility];  Service: Gynecology;  Laterality: N/A;  . Hysteroscopy    .  Dilation and curettage of uterus    . Hardware removal Left 12/12/2013    Procedure:  LEFT PATELLA HARDWARE REMOVAL;  Surgeon: [name], MD;  Location: [place];  Service: Orthopedics;  Laterality: Left;  . Knee arthroscopy Left 12/12/2013    Procedure: LEFT PATELLA ARTHROSCOPY KNEE WITH DEBRIDEMENT/SHAVING (CONDROPLASTY), LYSIS OF ADHESIONS;  Surgeon: [name], MD;  Location: [place];  Service: Orthopedics;  Laterality: Left;  . Left heart catheterization with coronary angiogram N/A 04/22/2014    Procedure: LEFT HEART CATHETERIZATION WITH CORONARY ANGIOGRAM;  Surgeon: Sinclair Grooms, MD;  Location: [facility] C[facility];  Service: Cardiovascular;  Laterality:  N/A;   History   Social History  . Marital Status: Single    Spouse Name: N/A    Number of Children: 2  . Years of Education: N/A   Occupational History  . RESEARCH ASSIST     Retired    Social History Main Topics  . Smoking status: Former Smoker    Quit date: 12/10/1983  . Smokeless tobacco: Never Used  . Alcohol Use: Yes     Comment: OCC  . Drug Use: No  . Sexual Activity: No   Other Topics Concern  . Not on file   Social History Narrative   Daily caffeine    Family History  Problem Relation Age of Onset  . Allergies Mother   . Heart disease Father   . Colon cancer Neg Hx    Patient Active Problem List   Diagnosis Date Noted  . Abdominal pain, epigastric 07/07/2014  . Fatigue 04/08/2014  . Dyspnea on exertion 04/08/2014  . Hyperlipidemia 04/08/2014  . Abdominal pain, left lower quadrant 05/20/2013  . Nausea alone 03/06/2013  . LLQ pain 03/05/2013  . Hyperglycemia 10/29/2012  . Urge incontinence of urine 10/29/2012  . Osteoporosis 05/13/2012  . Basal cell cancer 12/17/2011  . Sinus bradycardia by electrocardiogram 11/15/2011  . Hypothyroidism 09/07/2011  . DJD (degenerative joint disease) 09/07/2011  . Atrophic vaginitis 09/07/2011  . Radiculopathy of lumbar region 09/07/2011  . GERD 09/08/2010  . DIVERTICULITIS, COLON 09/08/2010  . CONSTIPATION 10/09/2009  . PERSONAL HX COLONIC POLYPS 10/09/2009  . Mixed hyperlipidemia 04/10/2009   Current Outpatient Prescriptions on File Prior to Visit  Medication Sig Dispense Refill  . acetaminophen (TYLENOL) 325 MG tablet Take 325 mg by mouth every 6 (six) hours as needed for mild pain.     . Calcium Citrate-Vitamin D (CALCIUM CITRATE + PO) Take 4 capsules by mouth daily. Contain 800mg  calcium citrate and other vitamins D,C, K, Mg, zinc, copper    . Cholecalciferol (VITAMIN D3) 5000 UNITS TABS Take 1 tablet by mouth daily.    . hyoscyamine (LEVSIN/SL) 0.125 MG SL tablet Place 1 tablet (0.125 mg total) under the  tongue every 4 (four) hours as needed. 120 tablet 2  . levothyroxine (SYNTHROID, LEVOTHROID) 112 MCG tablet Take 1 tablet (112 mcg total) by mouth daily. (Patient taking differently: Take 112 mcg by mouth daily. USES NAME BRAND ONLY) 90 tablet 1  . omeprazole (PRILOSEC OTC) 20 MG tablet Take 20 mg by mouth daily.    . Pitavastatin Calcium 2 MG TABS Take 1 tablet (2 mg total) by mouth daily. 90 tablet 3  . Probiotic Product (PROBIOTIC PO) Take 1-2 capsules by mouth as needed (for GI support).      No current facility-administered medications on file prior to visit.        Review of Systems See  HPI    Objective:   Physical Exam  Physical Exam  Nursing note and vitals reviewed.  Constitutional: She is oriented to person, place, and time. She appears well-developed and well-nourished.  HENT:  Head: Normocephalic and atraumatic.  Cardiovascular: Normal rate and regular rhythm. Exam reveals no gallop and no friction rub.  No murmur heard.  Pulmonary/Chest: Breath sounds normal. She has no wheezes. She has no rales.  Neurological: She is alert and oriented to person, place, and time.  Skin: Skin is warm and dry.  Psychiatric: She has a normal mood and affect. Her behavior is normal.             Assessment & Plan:  Fatigue  Will check free thyroid levels, CBC.    I do think stress is playing a large role with current circumstances  Situational stress she declines RX meds at this point.  I offered therapist but pt needs to think about this.   She will call if she wants referral   Hyperlipidemia   She has stopped all RX meds  See me in 2-3 weeks

## 2014-10-08 LAB — TSH: TSH: 1.2 u[IU]/mL (ref 0.350–4.500)

## 2014-10-08 LAB — T3, FREE: T3 FREE: 2.7 pg/mL (ref 2.3–4.2)

## 2014-10-08 LAB — T4, FREE: FREE T4: 1.36 ng/dL (ref 0.80–1.80)

## 2014-10-15 ENCOUNTER — Telehealth: Payer: Self-pay | Admitting: *Deleted

## 2014-10-15 ENCOUNTER — Encounter: Payer: Self-pay | Admitting: *Deleted

## 2014-10-15 NOTE — Telephone Encounter (Signed)
Also, pt will need providers that accept medicare.  Thanks [employer]

## 2014-10-15 NOTE — Telephone Encounter (Signed)
Pt called in stating she would like to know some names/contact info of providers she can talk with about some issues in her life. She stated this was discussed in her last OV with you. Who would you recommend?

## 2014-10-15 NOTE — Telephone Encounter (Signed)
I like Terri at [facility] across [employer].    I also like [employer]  [phone]

## 2014-10-15 NOTE — Telephone Encounter (Signed)
I gave the name and number for the person across the hall as per Dr. Kerrin Mo Bauert [phone]

## 2014-10-17 ENCOUNTER — Telehealth: Payer: Self-pay | Admitting: *Deleted

## 2014-10-17 NOTE — Telephone Encounter (Signed)
Pt called requesting a name of a counselor that takes medicare? Please advise

## 2014-10-17 NOTE — Telephone Encounter (Signed)
Pt informed with the below note. 

## 2014-10-17 NOTE — Telephone Encounter (Signed)
Not sure, Melinda Crutch is a good counselor office number [phone]

## 2014-10-24 ENCOUNTER — Ambulatory Visit (INDEPENDENT_AMBULATORY_CARE_PROVIDER_SITE_OTHER): Payer: Medicare Other | Admitting: Psychology

## 2014-10-24 DIAGNOSIS — F4323 Adjustment disorder with mixed anxiety and depressed mood: Secondary | ICD-10-CM | POA: Diagnosis not present

## 2014-10-28 ENCOUNTER — Ambulatory Visit: Payer: Medicare Other | Admitting: Internal Medicine

## 2014-10-30 DIAGNOSIS — H02535 Eyelid retraction left lower eyelid: Secondary | ICD-10-CM | POA: Insufficient documentation

## 2014-10-30 DIAGNOSIS — H5789 Other specified disorders of eye and adnexa: Secondary | ICD-10-CM | POA: Insufficient documentation

## 2014-10-30 DIAGNOSIS — H04123 Dry eye syndrome of bilateral lacrimal glands: Secondary | ICD-10-CM | POA: Insufficient documentation

## 2014-10-30 DIAGNOSIS — H0259 Other disorders affecting eyelid function: Secondary | ICD-10-CM | POA: Diagnosis not present

## 2014-10-30 DIAGNOSIS — H02423 Myogenic ptosis of bilateral eyelids: Secondary | ICD-10-CM | POA: Diagnosis not present

## 2014-10-30 DIAGNOSIS — E0500 Thyrotoxicosis with diffuse goiter without thyrotoxic crisis or storm: Secondary | ICD-10-CM | POA: Insufficient documentation

## 2014-10-30 DIAGNOSIS — H02532 Eyelid retraction right lower eyelid: Secondary | ICD-10-CM | POA: Insufficient documentation

## 2014-10-30 DIAGNOSIS — H02889 Meibomian gland dysfunction of unspecified eye, unspecified eyelid: Secondary | ICD-10-CM | POA: Insufficient documentation

## 2014-10-30 DIAGNOSIS — H53453 Other localized visual field defect, bilateral: Secondary | ICD-10-CM | POA: Diagnosis not present

## 2014-11-07 ENCOUNTER — Ambulatory Visit: Payer: Medicare Other | Admitting: Psychology

## 2014-11-17 ENCOUNTER — Other Ambulatory Visit: Payer: Self-pay | Admitting: *Deleted

## 2014-11-17 DIAGNOSIS — H04123 Dry eye syndrome of bilateral lacrimal glands: Secondary | ICD-10-CM | POA: Insufficient documentation

## 2014-11-17 DIAGNOSIS — H16213 Exposure keratoconjunctivitis, bilateral: Secondary | ICD-10-CM | POA: Insufficient documentation

## 2014-11-17 DIAGNOSIS — H53453 Other localized visual field defect, bilateral: Secondary | ICD-10-CM | POA: Insufficient documentation

## 2014-11-17 DIAGNOSIS — H0259 Other disorders affecting eyelid function: Secondary | ICD-10-CM | POA: Insufficient documentation

## 2014-11-17 DIAGNOSIS — H02423 Myogenic ptosis of bilateral eyelids: Secondary | ICD-10-CM | POA: Insufficient documentation

## 2014-11-17 MED ORDER — LEVOTHYROXINE SODIUM 112 MCG PO TABS: 112.0000 ug | ORAL_TABLET | Freq: Every day | ORAL | Status: DC

## 2014-11-17 NOTE — Telephone Encounter (Signed)
Refill request - Brand name only

## 2014-11-18 DIAGNOSIS — H16213 Exposure keratoconjunctivitis, bilateral: Secondary | ICD-10-CM | POA: Diagnosis not present

## 2014-11-18 DIAGNOSIS — Z79899 Other long term (current) drug therapy: Secondary | ICD-10-CM | POA: Diagnosis not present

## 2014-11-18 DIAGNOSIS — H0259 Other disorders affecting eyelid function: Secondary | ICD-10-CM | POA: Diagnosis not present

## 2014-11-18 DIAGNOSIS — H0235 Blepharochalasis left lower eyelid: Secondary | ICD-10-CM | POA: Diagnosis not present

## 2014-11-18 DIAGNOSIS — Z888 Allergy status to other drugs, medicaments and biological substances status: Secondary | ICD-10-CM | POA: Diagnosis not present

## 2014-11-18 DIAGNOSIS — Z880 Allergy status to penicillin: Secondary | ICD-10-CM | POA: Diagnosis not present

## 2014-11-18 DIAGNOSIS — Z883 Allergy status to other anti-infective agents status: Secondary | ICD-10-CM | POA: Diagnosis not present

## 2014-11-18 DIAGNOSIS — H02532 Eyelid retraction right lower eyelid: Secondary | ICD-10-CM | POA: Diagnosis not present

## 2014-11-18 DIAGNOSIS — E039 Hypothyroidism, unspecified: Secondary | ICD-10-CM | POA: Diagnosis not present

## 2014-11-18 DIAGNOSIS — K219 Gastro-esophageal reflux disease without esophagitis: Secondary | ICD-10-CM | POA: Diagnosis not present

## 2014-11-18 DIAGNOSIS — H04123 Dry eye syndrome of bilateral lacrimal glands: Secondary | ICD-10-CM | POA: Diagnosis not present

## 2014-11-18 DIAGNOSIS — Z87891 Personal history of nicotine dependence: Secondary | ICD-10-CM | POA: Diagnosis not present

## 2014-11-18 DIAGNOSIS — H53453 Other localized visual field defect, bilateral: Secondary | ICD-10-CM | POA: Diagnosis not present

## 2014-11-18 DIAGNOSIS — Z886 Allergy status to analgesic agent status: Secondary | ICD-10-CM | POA: Diagnosis not present

## 2014-11-18 DIAGNOSIS — H02423 Myogenic ptosis of bilateral eyelids: Secondary | ICD-10-CM | POA: Diagnosis not present

## 2014-11-28 DIAGNOSIS — H01009 Unspecified blepharitis unspecified eye, unspecified eyelid: Secondary | ICD-10-CM | POA: Diagnosis not present

## 2014-11-28 DIAGNOSIS — H1045 Other chronic allergic conjunctivitis: Secondary | ICD-10-CM | POA: Diagnosis not present

## 2014-12-04 DIAGNOSIS — H04123 Dry eye syndrome of bilateral lacrimal glands: Secondary | ICD-10-CM | POA: Diagnosis not present

## 2014-12-04 DIAGNOSIS — H16223 Keratoconjunctivitis sicca, not specified as Sjogren's, bilateral: Secondary | ICD-10-CM | POA: Diagnosis not present

## 2014-12-08 DIAGNOSIS — H11423 Conjunctival edema, bilateral: Secondary | ICD-10-CM | POA: Insufficient documentation

## 2014-12-12 ENCOUNTER — Telehealth: Payer: Self-pay | Admitting: *Deleted

## 2014-12-12 NOTE — Telephone Encounter (Signed)
Pt informed with the below note. 

## 2014-12-12 NOTE — Telephone Encounter (Signed)
Pt is looking for a new PCP, she would like the name of a specific Doctor. I told pt [facility] is an option, and pt would like a name of a MD at [facility] you would recommend. Please advise

## 2014-12-12 NOTE — Telephone Encounter (Signed)
I do not recommend specific doctor's names as I do not have experience with their younger physicians but I would recommend choosing one of the newest doctors to join the practice. That way they will be there for years to come.

## 2014-12-26 ENCOUNTER — Encounter: Payer: Self-pay | Admitting: Cardiology

## 2014-12-26 ENCOUNTER — Ambulatory Visit (INDEPENDENT_AMBULATORY_CARE_PROVIDER_SITE_OTHER): Payer: Medicare Other | Admitting: Cardiology

## 2014-12-26 VITALS — BP 138/82 | HR 91 | Ht 60.0 in | Wt 167.0 lb

## 2014-12-26 DIAGNOSIS — E785 Hyperlipidemia, unspecified: Secondary | ICD-10-CM

## 2014-12-26 DIAGNOSIS — R0600 Dyspnea, unspecified: Secondary | ICD-10-CM

## 2014-12-26 DIAGNOSIS — R0609 Other forms of dyspnea: Secondary | ICD-10-CM | POA: Diagnosis not present

## 2014-12-26 DIAGNOSIS — I10 Essential (primary) hypertension: Secondary | ICD-10-CM

## 2014-12-26 NOTE — Progress Notes (Signed)
Patient ID: Marilyn Rivas, female   DOB: 1941-01-21, 74 y.o.   MRN: [id number] Patient ID: Marilyn Rivas, female   DOB: 15-May-1941, 74 y.o.   MRN: [id number]    Patient Name: Marilyn Rivas Date of Encounter: 12/26/2014  Primary Care Provider:  Kelton Pillar, MD Primary Cardiologist:  [facility]  Problem List   Past Medical History  Diagnosis Date  . Thyroid disease   . Hyperlipidemia   . Menopause   . Osteopenia   . Diverticulosis   . Diverticulitis   . Hx of adenomatous colonic polyps   . Seasonal allergies   . Cataracts, both eyes   . GERD (gastroesophageal reflux disease)     HISTORY  . Arthritis   . Lichen sclerosus 09/7251    Biopsy proven   Past Surgical History  Procedure Laterality Date  . Appendectomy  1977  . Right oophorectomy  1977  . Patella reconstruction Left 1994  . Cervical fusion  1999    C5-7  . Lumbar disc surgery  2004    L5  . Cataract surgery Bilateral 06/2012  . Dilatation & curettage/hysteroscopy with trueclear N/A 05/02/2013    Procedure: DILATATION & CURETTAGE/HYSTEROSCOPY WITH TRUECLEAR ;  Surgeon: Marilyn Auerbach, MD;  [facility];  Service: Gynecology;  Laterality: N/A;  . Hysteroscopy    . Dilation and curettage of uterus    . Hardware removal Left 12/12/2013    Procedure:  LEFT PATELLA HARDWARE REMOVAL;  Surgeon: Marilyn Lights, MD;  Location: [place];  Service: Orthopedics;  Laterality: Left;  . Knee arthroscopy Left 12/12/2013    Procedure: LEFT PATELLA ARTHROSCOPY KNEE WITH DEBRIDEMENT/SHAVING (CONDROPLASTY), LYSIS OF ADHESIONS;  Surgeon: Marilyn Lights, MD;  Location: [place];  Service: Orthopedics;  Laterality: Left;  . Left heart catheterization with coronary angiogram N/A 04/22/2014    Procedure: LEFT HEART CATHETERIZATION WITH CORONARY ANGIOGRAM;  Surgeon: Marilyn Grooms, MD;  [facility]: [facility];  Service: Cardiovascular;  Laterality: N/A;     Allergies  Allergies  Allergen Reactions  . Aleve [Naproxen Sodium] Other (See Comments)    Severe abdominal pain  . Aspirin Other (See Comments)    REACTION: severe abdominal pain and excessive salavation   . Atorvastatin Other (See Comments)    myalgia  . Crestor [Rosuvastatin] Other (See Comments)    Muscle aches  . Durezol [Difluprednate]     Eye redness    . Other     Paseo eye drops - causes eye redness  . Adhesive [Tape] Rash    Looks burned  . Clarithromycin Rash    Says allergic to "mycins"  . Penicillins Swelling and Rash    Hands swelling  . Zofran [Ondansetron Hcl] Other (See Comments)    Headache     HPI  A very pleasant 74 year old female who is coming for concern of dyspnea on exertion and fatigue. The patient is generally very active she exercises twice a week in the gym and walks almost daily. She has noticed recently that occasionally she gets short of breath on exertion but overall she feels very tired. The patient has no prior history of cardiac disease. She has never had an echocardiogram. She has known history of hyperlipidemia there was recently started on Crestor 5 mg daily. She reports muscle cramping and is scheduled for a blood work next week. The patient denies any chest pain, palpitation or recent syncope. She has smoked 30 years ago. Her  father had myocardial infarction at age of 90 and underwent quadruple bypass and AAA repair at age of 7. Her daughter has a pacemaker for unknown reason.  06/17/2014 - negative cardiac cath - normal coronaries despite abnormal stress test. No chest pain, no SOB. Continues to have muscle cramping despite normal LFTS. She stopped using crestor. No palpitations, LE edema, orthopnea or syncope.  12/26/2014 - the patient is coming after 6 months, she is doing well, denies CP, has SOB when its warm outside, used to do silver sneakers 3 x per week, now post eyelid surgery and hasn't exercised in 2 months. She stopped  using Livalo because of significant muscle pain.    Home Medications  Prior to Admission medications   Medication Sig Start Date End Date Taking? Authorizing Provider  acetaminophen (TYLENOL) 325 MG tablet Take 325 mg by mouth every 6 (six) hours as needed.   Yes Historical Provider, MD  Calcium Citrate-Vitamin D (CALCIUM CITRATE + PO) Take 4 capsules by mouth daily. Contain 800mg  calcium citrate and other vitamins D,C, K, Mg, zinc, copper   Yes Historical Provider, MD  Cholecalciferol (VITAMIN D3) 5000 UNITS TABS Take 1 tablet by mouth daily.   Yes Historical Provider, MD  diphenhydramine-acetaminophen (TYLENOL PM) 25-500 MG TABS Take 1 tablet by mouth at bedtime as needed.   Yes Historical Provider, MD  hyoscyamine (LEVSIN, ANASPAZ) 0.125 MG tablet Take 0.125 mg by mouth every 4 (four) hours as needed for cramping.   Yes Historical Provider, MD  levothyroxine (SYNTHROID, LEVOTHROID) 112 MCG tablet Take 1 tablet (112 mcg total) by mouth daily. 06/29/12  Yes Marilyn Shirts, MD  omeprazole (PRILOSEC OTC) 20 MG tablet Take 20 mg by mouth daily.   Yes Historical Provider, MD  Probiotic Product (PROBIOTIC PO) Take 1-2 capsules by mouth as needed.    Yes Historical Provider, MD  rosuvastatin (CRESTOR) 5 MG tablet Take 5 mg by mouth 2 (two) times a week. 03/06/14  Yes Marilyn Kingdom, MD    Family History  Family History  Problem Relation Age of Onset  . Allergies Mother   . Heart disease Father   . Colon cancer Neg Hx     Social History  History   Social History  . Marital Status: Single    Spouse Name: N/A  . Number of Children: 2  . Years of Education: N/A   Occupational History  . RESEARCH ASSIST     Retired    Social History Main Topics  . Smoking status: Former Smoker    Quit date: 12/10/1983  . Smokeless tobacco: Never Used  . Alcohol Use: Yes     Comment: OCC  . Drug Use: No  . Sexual Activity: No   Other Topics Concern  . Not on file   Social History  Narrative   Daily caffeine      Review of Systems, as per HPI, otherwise negative General:  No chills, fever, night sweats or weight changes.  Cardiovascular:  No chest pain, dyspnea on exertion, edema, orthopnea, palpitations, paroxysmal nocturnal dyspnea. Dermatological: No rash, lesions/masses Respiratory: No cough, dyspnea Urologic: No hematuria, dysuria Abdominal:   No nausea, vomiting, diarrhea, bright red blood per rectum, melena, or hematemesis Neurologic:  No visual changes, wkns, changes in mental status. All other systems reviewed and are otherwise negative except as noted above.  Physical Exam  Blood pressure 138/82, pulse 91, height 5' (1.524 m), weight 167 lb (75.751 kg).  General: Pleasant, NAD Psych: Normal affect. Neuro: Alert  and oriented X 3. Moves all extremities spontaneously. HEENT: Normal  Neck: Supple without bruits or JVD. Lungs:  Resp regular and unlabored, CTA. Heart: RRR no s3, s4, or murmurs. Abdomen: Soft, non-tender, non-distended, BS + x 4.  Extremities: No clubbing, cyanosis or edema. DP/PT/Radials 2+ and equal bilaterally.  Labs:  No results for input(s): CKTOTAL, CKMB, TROPONINI in the last 72 hours. [facility] Results  Component Value Date   WBC 7.1 10/07/2014   HGB 15.3* 10/07/2014   HCT 43.7 10/07/2014   MCV 89.9 10/07/2014   PLT 285 10/07/2014    [facility] Results  Component Value Date   DDIMER 0.29 06/06/2014   Invalid input(s): POCBNP    Component Value Date/Time   NA 141 10/07/2014 1202   K 4.3 10/07/2014 1202   CL 105 10/07/2014 1202   CO2 27 10/07/2014 1202   GLUCOSE 84 10/07/2014 1202   BUN 7 10/07/2014 1202   CREATININE 0.64 10/07/2014 1202   CREATININE 0.7 07/15/2014 0835   CALCIUM 9.9 10/07/2014 1202   PROT 6.7 10/07/2014 1202   ALBUMIN 4.4 10/07/2014 1202   AST 32 10/07/2014 1202   ALT 33 10/07/2014 1202   ALKPHOS 75 10/07/2014 1202   BILITOT 0.7 10/07/2014 1202   GFRNONAA 84* 04/23/2013 1237   GFRAA >90 04/23/2013 1237    [facility] Results  Component Value Date   CHOL 183 07/15/2014   HDL 50.70 07/15/2014   LDLCALC 107* 07/15/2014   TRIG 126.0 07/15/2014    Accessory Clinical Findings  Echocardiogram - none  ECG - 02/19/2014 - sinus bradycardia, diffuse negative T waves in inferior anterolateral leads  Left cardiac cath: LEFT VENTRICULOGRAM: Left ventricular angiogram was done in the 30 RAO projection and revealed normal cavity size and EF 60%.  IMPRESSIONS: 1. Normal coronary arteries  2. Normal left ventricular systolic function with EF 60% and normal hemodynamics  3. Dyspnea not likely related to cardiac etiology  RECOMMENDATION: Management per primary team with other considerations being pulmonary sources of dyspnea, deconditioning, and sleep apnea..    Assessment & Plan  1. Dyspnea on exertion, fatigue - to gather with diffuse negative T waves in inferior and anterolateral leads this was very concerning for possible ischemia, there was abnormal stress test, but normal left cardiac cath. She is asymptomatic, encouraged to restart exercise.  2. Blood pressure - well controlled  3. Hyperlipidemia - recently started on Crestor 5 mg daily, patient is experiencing muscle cramping, same with Livalo, however great response LDL 180 --> 107. We will discontinue and start red yeast rice considering she had normal cath.   Follow up in 1 year.     Marilyn Spark, MD, [facility] 12/26/2014, 10:17 AM

## 2014-12-26 NOTE — Patient Instructions (Signed)
Medication Instructions:  Your physician recommends that you continue on your current medications as directed. Please refer to the Current Medication list given to you today.  Labwork: No new orders.  Testing/Procedures: No new orders.  Follow-Up: Your physician wants you to follow-up in: 1 YEAR with Dr Meda Coffee.  You will receive a reminder letter in the mail two months in advance. If you don't receive a letter, please call our office to schedule the follow-up appointment.  Any Other Special Instructions Will Be Listed Below (If Applicable).

## 2014-12-27 DIAGNOSIS — L718 Other rosacea: Secondary | ICD-10-CM | POA: Insufficient documentation

## 2015-01-14 DIAGNOSIS — J309 Allergic rhinitis, unspecified: Secondary | ICD-10-CM | POA: Diagnosis not present

## 2015-01-14 DIAGNOSIS — J9801 Acute bronchospasm: Secondary | ICD-10-CM | POA: Diagnosis not present

## 2015-01-20 ENCOUNTER — Encounter: Payer: Self-pay | Admitting: *Deleted

## 2015-02-10 DIAGNOSIS — H01001 Unspecified blepharitis right upper eyelid: Secondary | ICD-10-CM | POA: Diagnosis not present

## 2015-02-10 DIAGNOSIS — H16223 Keratoconjunctivitis sicca, not specified as Sjogren's, bilateral: Secondary | ICD-10-CM | POA: Diagnosis not present

## 2015-02-10 DIAGNOSIS — H01004 Unspecified blepharitis left upper eyelid: Secondary | ICD-10-CM | POA: Diagnosis not present

## 2015-02-10 DIAGNOSIS — H01002 Unspecified blepharitis right lower eyelid: Secondary | ICD-10-CM | POA: Diagnosis not present

## 2015-02-12 ENCOUNTER — Encounter: Payer: Self-pay | Admitting: Gynecology

## 2015-02-12 ENCOUNTER — Other Ambulatory Visit: Payer: Self-pay | Admitting: Internal Medicine

## 2015-02-12 ENCOUNTER — Telehealth: Payer: Self-pay | Admitting: Internal Medicine

## 2015-02-12 ENCOUNTER — Ambulatory Visit (INDEPENDENT_AMBULATORY_CARE_PROVIDER_SITE_OTHER): Payer: Medicare Other | Admitting: Gynecology

## 2015-02-12 VITALS — BP 126/80

## 2015-02-12 DIAGNOSIS — N898 Other specified noninflammatory disorders of vagina: Secondary | ICD-10-CM

## 2015-02-12 DIAGNOSIS — N3000 Acute cystitis without hematuria: Secondary | ICD-10-CM

## 2015-02-12 LAB — URINALYSIS W MICROSCOPIC + REFLEX CULTURE
Bilirubin Urine: NEGATIVE
Casts: NONE SEEN
Crystals: NONE SEEN
Glucose, UA: NEGATIVE mg/dL
HGB URINE DIPSTICK: NEGATIVE
Ketones, ur: NEGATIVE mg/dL
NITRITE: NEGATIVE
PROTEIN: NEGATIVE mg/dL
Specific Gravity, Urine: 1.02 (ref 1.005–1.030)
UROBILINOGEN UA: 0.2 mg/dL (ref 0.0–1.0)
pH: 7 (ref 5.0–8.0)

## 2015-02-12 LAB — WET PREP FOR TRICH, YEAST, CLUE
Clue Cells Wet Prep HPF POC: NONE SEEN
Trich, Wet Prep: NONE SEEN
YEAST WET PREP: NONE SEEN

## 2015-02-12 MED ORDER — METRONIDAZOLE 500 MG PO TABS: ORAL_TABLET | ORAL | Status: DC

## 2015-02-12 MED ORDER — SULFAMETHOXAZOLE-TRIMETHOPRIM 800-160 MG PO TABS: 1.0000 | ORAL_TABLET | Freq: Two times a day (BID) | ORAL | Status: DC

## 2015-02-12 MED ORDER — CIPROFLOXACIN HCL 500 MG PO TABS: ORAL_TABLET | ORAL | Status: DC

## 2015-02-12 NOTE — Addendum Note (Signed)
Addended by: Nelva Nay on: 02/12/2015 11:44 AM   Modules accepted: Orders

## 2015-02-12 NOTE — Patient Instructions (Signed)
Take the antibiotic twice daily for 3 days. Follow up if her symptoms persist, worsen or recur. Apply the vaginal cream externally as needed for itching.

## 2015-02-12 NOTE — Telephone Encounter (Signed)
Spoke with patient and gave her recommendations. Rx's sent. Scheduled f/u on 02/26/15 at 2:00 PM with Alonza Bogus, PA.

## 2015-02-12 NOTE — Telephone Encounter (Signed)
Spoke with patient and she states she has had left sided abdominal pain for 3-4 days that is getting worse. States she is alternating diarrhea and constipation. She reports nausea without vomiting. Denies fever. Hx of diverticulitis. She is asking for Cipro and Flagy. Dr. Henrene Pastor off. DOD - [facility]. Please, advise.

## 2015-02-12 NOTE — Progress Notes (Signed)
Marilyn Rivas [birth year] [id number]        74 y.o.  Presents with several days of increasing urinary frequency and low back pain. No fever chills nausea vomiting diarrhea constipation. No urgency or dysuria. Also notes a little bit of vulvar itching. History of same before and had been using Mycolog cream with good results.  Past medical history,surgical history, problem list, medications, allergies, family history and social history were all reviewed and documented in the EPIC chart.  Directed ROS with pertinent positives and negatives documented in the history of present illness/assessment and plan.  Exam: Kim assistant Filed Vitals:   02/12/15 0917  BP: 126/80   General appearance:  Normal Spine straight without CVA tenderness External BUS vagina with atrophic changes.  Cervix atrophic. Uterus difficult to palpate but no gross masses or tenderness. Adnexa without gross masses or tenderness.  Assessment/Plan:  74 y.o. with several days of frequency and low back pain.  Urinalysis suspicious for early UTI. Will cover with Septra DS 1 by mouth twice a day 3 days. Also with some vulvar itching wet prep was negative for yeast or bacterial vaginosis. Does use Mycolog intermittently which seems to help. Will continue to do so. Has supply at home but will call when she needs more. Follow up if symptoms persist, worsen or recur.    Anastasio Auerbach MD, 9:42 AM 02/12/2015

## 2015-02-12 NOTE — Telephone Encounter (Signed)
Okay to begin Cipro 500 mg twice a day and Flagyl 500 mg twice a day 7 days.  Office visit with extender or Dr. Henrene Pastor recommended next week for continuity of care and assessment of symptoms

## 2015-02-13 LAB — URINE CULTURE
COLONY COUNT: NO GROWTH
ORGANISM ID, BACTERIA: NO GROWTH

## 2015-02-18 ENCOUNTER — Telehealth: Payer: Self-pay | Admitting: Gastroenterology

## 2015-02-18 NOTE — Telephone Encounter (Signed)
Pt called last week with diverticulitis flare and was given cipro and flagyl and told to follow up this week. There were no appts available and she was scheduled for 02/26/15 with [facility]. Pt is calling today stating she is still having abdominal pain. There is an opening on your schedule this afternoon. Is it ok to schedule her then? Please advise.

## 2015-02-18 NOTE — Telephone Encounter (Signed)
Ok to schedule for Dr. Henrene Pastor on Friday. Thanks!

## 2015-02-18 NOTE — Telephone Encounter (Signed)
Pt scheduled for OV with Dr. Henrene Pastor on 02/20/15.

## 2015-02-20 ENCOUNTER — Encounter: Payer: Self-pay | Admitting: Internal Medicine

## 2015-02-20 ENCOUNTER — Ambulatory Visit (INDEPENDENT_AMBULATORY_CARE_PROVIDER_SITE_OTHER)
Admission: RE | Admit: 2015-02-20 | Discharge: 2015-02-20 | Disposition: A | Discharge status: Home / Self Care | Payer: Medicare Other | Source: Ambulatory Visit | Attending: Internal Medicine | Admitting: Internal Medicine

## 2015-02-20 ENCOUNTER — Other Ambulatory Visit (INDEPENDENT_AMBULATORY_CARE_PROVIDER_SITE_OTHER): Payer: Medicare Other

## 2015-02-20 ENCOUNTER — Ambulatory Visit (INDEPENDENT_AMBULATORY_CARE_PROVIDER_SITE_OTHER): Payer: Medicare Other | Admitting: Internal Medicine

## 2015-02-20 ENCOUNTER — Telehealth: Payer: Self-pay

## 2015-02-20 VITALS — BP 128/78 | HR 72 | Ht 60.0 in | Wt 164.1 lb

## 2015-02-20 DIAGNOSIS — R109 Unspecified abdominal pain: Secondary | ICD-10-CM | POA: Diagnosis not present

## 2015-02-20 DIAGNOSIS — K5730 Diverticulosis of large intestine without perforation or abscess without bleeding: Secondary | ICD-10-CM | POA: Diagnosis not present

## 2015-02-20 DIAGNOSIS — R1032 Left lower quadrant pain: Secondary | ICD-10-CM

## 2015-02-20 LAB — URINALYSIS, ROUTINE W REFLEX MICROSCOPIC
Bilirubin Urine: NEGATIVE
Ketones, ur: NEGATIVE
Nitrite: NEGATIVE
Specific Gravity, Urine: 1.025 (ref 1.000–1.030)
URINE GLUCOSE: NEGATIVE
Urobilinogen, UA: 0.2 (ref 0.0–1.0)
pH: 6 (ref 5.0–8.0)

## 2015-02-20 LAB — BASIC METABOLIC PANEL
BUN: 8 mg/dL (ref 6–23)
CALCIUM: 9.7 mg/dL (ref 8.4–10.5)
CO2: 30 mEq/L (ref 19–32)
Chloride: 106 mEq/L (ref 96–112)
Creatinine, Ser: 0.76 mg/dL (ref 0.40–1.20)
GFR: 78.9 mL/min (ref >60.00)
Glucose, Bld: 118 mg/dL — ABNORMAL HIGH (ref 70–99)
Potassium: 4 mEq/L (ref 3.5–5.1)
SODIUM: 140 meq/L (ref 135–145)

## 2015-02-20 MED ORDER — IOHEXOL 300 MG/ML  SOLN
100.0000 mL | Freq: Once | INTRAMUSCULAR | Status: AC | PRN
  Administered 2015-02-20: 100 mL via INTRAVENOUS

## 2015-02-20 MED ORDER — HYDROCODONE-ACETAMINOPHEN 5-325 MG PO TABS: 1.0000 | ORAL_TABLET | Freq: Four times a day (QID) | ORAL | Status: DC | PRN

## 2015-02-20 NOTE — Progress Notes (Signed)
HISTORY OF PRESENT ILLNESS:  Marilyn Rivas is a 74 y.o. female with past medical history as listed below. She has been seen in this office for GERD, dysphagia, and adenomatous polyp surveillance. I last saw the patient October 2014 when she was evaluated for gagging sensation with intermittent vomiting felt to be a reflux colon. She was prescribed therapy and asked to follow-up in 2 months. She did not. She was last seen by the nurse practitioner November 2015 for lower abdominal pain felt likely to be diverticulitis. Her pain resolved after empiric course of antibiotics. She was doing well until about one week ago when she contacted the office complaining of left lower quadrant pain. She was concerned about diverticulitis. She was prescribed ciprofloxacin 500 mg twice a day and metronidazole 500 mg twice a day for 7 days. She comes into the office today for follow-up. Patient tells me she is "30% better". She also states that her pain is "different" than previous bouts of diverticulitis. The discomfort is currently described as being lower in the left lower quadrant and there is radiation to the right flank and back. She also notices that the discomfort is worse when she sits up. No fevers. Bowel habits are okay. No bleeding. She does have a history of chronic back problems. She denies having history of kidney stones. No dysuria or other urinary complaints. She cannot describe any additional exacerbating or relieving factors.  REVIEW OF SYSTEMS:  All non-GI ROS negative except for sinus allergy trouble, anxiety, fatigue, shortness of breath, stress  Past Medical History  Diagnosis Date  . Thyroid disease   . Hyperlipidemia   . Menopause   . Osteopenia   . Diverticulosis   . Diverticulitis   . Hx of adenomatous colonic polyps   . Seasonal allergies   . Cataracts, both eyes   . GERD (gastroesophageal reflux disease)     HISTORY  . Arthritis   . Lichen sclerosus 08/930    Biopsy proven     Past Surgical History  Procedure Laterality Date  . Appendectomy  1977  . Right oophorectomy  1977  . Patella reconstruction Left 1994  . Cervical fusion  1999    C5-7  . Lumbar disc surgery  2004    L5  . Cataract surgery Bilateral 06/2012  . Dilatation & curettage/hysteroscopy with trueclear N/A 05/02/2013    Procedure: DILATATION & CURETTAGE/HYSTEROSCOPY WITH TRUECLEAR ;  Surgeon: Anastasio Auerbach, MD;  Location: [facility];  Service: Gynecology;  Laterality: N/A;  . Hysteroscopy    . Dilation and curettage of uterus    . Hardware removal Left 12/12/2013    Procedure:  LEFT PATELLA HARDWARE REMOVAL;  Surgeon: [name], MD;  Location: [facility];  Service: Orthopedics;  Laterality: Left;  . Knee arthroscopy Left 12/12/2013    Procedure: LEFT PATELLA ARTHROSCOPY KNEE WITH DEBRIDEMENT/SHAVING (CONDROPLASTY), LYSIS OF ADHESIONS;  Surgeon: [name], MD;  Location: [facility];  Service: Orthopedics;  Laterality: Left;  . Left heart catheterization with coronary angiogram N/A 04/22/2014    Procedure: LEFT HEART CATHETERIZATION WITH CORONARY ANGIOGRAM;  Surgeon: Sinclair Grooms, MD;  Location[facility] C[facility] LAB;  Service: Cardiovascular;  Laterality: N/A;  . Eye lid surg      Social History Khrystina B Holstad  reports that she quit smoking about 31 years ago. She has never used smokeless tobacco. She reports that she drinks alcohol. She reports that she does not use illicit drugs.  family history includes Allergies in her mother; Heart disease in her father. There is no history of Colon cancer.  Allergies  Allergen Reactions  . Aleve [Naproxen Sodium] Other (See Comments)    Severe abdominal pain  . Aspirin Other (See Comments)    REACTION: severe abdominal pain and excessive salavation   . Atorvastatin Other (See Comments)    myalgia  . Crestor [Rosuvastatin] Other (See Comments)    Muscle aches  . Durezol [Difluprednate]     Eye  redness    . Other     Paseo eye drops - causes eye redness  . Adhesive [Tape] Rash    Looks burned  . Clarithromycin Rash    Says allergic to "mycins"  . Penicillins Swelling and Rash    Hands swelling  . Zofran [Ondansetron Hcl] Other (See Comments)    Headache        PHYSICAL EXAMINATION: Vital signs: BP 128/78 mmHg  Pulse 72  Ht 5' (1.524 m)  Wt 164 lb 2 oz (74.447 kg)  BMI 32.05 kg/m2 General: Well-developed, well-nourished, no acute distress HEENT: Sclerae are anicteric, conjunctiva pink. Oral mucosa intact Lungs: Clear Heart: Regular Abdomen: soft, obese, mild tenderness with deep palpation in the left lower quadrant. No rebound, nondistended, no obvious ascites, no peritoneal signs, normal bowel sounds. No organomegaly. Extremities: No edema Psychiatric: alert and oriented x3. Cooperative    ASSESSMENT:  #1. Left lower quadrant discomfort with radiation to the right flank and back as described. Possible diverticulitis. Possible musculoskeletal from back. Possible urologic.  PLAN:  #1. Urinalysis #2. Contrast-enhanced CT scan of the abdomen and pelvis to rule out acute diverticulitis #3. Limited supply of Vicodin (per patient request) #10. No refills. #4. Additional treatment plan pending the outcome of CT scan and urinalysis

## 2015-02-20 NOTE — Patient Instructions (Signed)
  Your physician has requested that you go to the basement for the following lab work before leaving today: BMET  We have given you a prescription for the  following medications:  Vicodin    You have been scheduled for a CT scan of the abdomen and pelvis at [facility] ([address]---this is in the same building as Press [facility]).   You are scheduled on 02/20/2015 at 2:30pm. You should arrive 15 minutes prior to your appointment time for registration. Please follow the written instructions below on the day of your exam:  WARNING: IF YOU ARE ALLERGIC TO IODINE/X-RAY DYE, PLEASE NOTIFY RADIOLOGY IMMEDIATELY AT [phone]! YOU WILL BE GIVEN A 13 HOUR PREMEDICATION PREP.  1) Do not eat or drink anything after 10:30am (4 hours prior to your test) 2) You have been given 2 bottles of oral contrast to drink. The solution may taste better if refrigerated, but do NOT add ice or any other liquid to this solution. Shake well before drinking.    Drink 1 bottle of contrast @ 12:30pm (2 hours prior to your exam)  Drink 1 bottle of contrast @ 1:30pm (1 hour prior to your exam)  You may take any medications as prescribed with a small amount of water except for the following: Metformin, Glucophage, Glucovance, Avandamet, Riomet, Fortamet, Actoplus Met, Janumet, Glumetza or Metaglip. The above medications must be held the day of the exam AND 48 hours after the exam.  The purpose of you drinking the oral contrast is to aid in the visualization of your intestinal tract. The contrast solution may cause some diarrhea. Before your exam is started, you will be given a small amount of fluid to drink. Depending on your individual set of symptoms, you may also receive an intravenous injection of x-ray contrast/dye. Plan on being at [facility] for 30 minutes or long, depending on the type of exam you are having performed.  If you have any questions regarding your exam or if you need to  reschedule, you may call the CT department at [phone] between the hours of 8:00 am and 5:00 pm, Monday-Friday.  ________________________________________________________________________  Jolyne Loa

## 2015-02-20 NOTE — Telephone Encounter (Signed)
Please see CAT scan report recommendations

## 2015-02-20 NOTE — Telephone Encounter (Signed)
Pt calling for CT results. Please advise. 

## 2015-02-20 NOTE — Telephone Encounter (Signed)
See result note.  

## 2015-02-23 DIAGNOSIS — M545 Low back pain: Secondary | ICD-10-CM | POA: Diagnosis not present

## 2015-02-24 ENCOUNTER — Telehealth: Payer: Self-pay | Admitting: Internal Medicine

## 2015-02-24 NOTE — Telephone Encounter (Signed)
Patient had questions about CT scan and if it looked at her pancrease and her gallbladder.  I reviewed all the results with her and all questions answered.  She will call back for additional questions or concerns

## 2015-02-26 ENCOUNTER — Ambulatory Visit: Payer: Self-pay | Admitting: Gastroenterology

## 2015-03-09 ENCOUNTER — Other Ambulatory Visit: Payer: Self-pay | Admitting: Orthopedic Surgery

## 2015-03-09 DIAGNOSIS — M545 Low back pain: Secondary | ICD-10-CM

## 2015-03-10 DIAGNOSIS — H04123 Dry eye syndrome of bilateral lacrimal glands: Secondary | ICD-10-CM | POA: Diagnosis not present

## 2015-03-11 DIAGNOSIS — Z85828 Personal history of other malignant neoplasm of skin: Secondary | ICD-10-CM | POA: Diagnosis not present

## 2015-03-11 DIAGNOSIS — D485 Neoplasm of uncertain behavior of skin: Secondary | ICD-10-CM | POA: Diagnosis not present

## 2015-03-11 DIAGNOSIS — Z86018 Personal history of other benign neoplasm: Secondary | ICD-10-CM | POA: Diagnosis not present

## 2015-03-11 DIAGNOSIS — D225 Melanocytic nevi of trunk: Secondary | ICD-10-CM | POA: Diagnosis not present

## 2015-03-17 ENCOUNTER — Ambulatory Visit
Admission: RE | Admit: 2015-03-17 | Discharge: 2015-03-17 | Disposition: A | Discharge status: Home / Self Care | Payer: Medicare Other | Source: Ambulatory Visit | Attending: Orthopedic Surgery | Admitting: Orthopedic Surgery

## 2015-03-17 DIAGNOSIS — M5126 Other intervertebral disc displacement, lumbar region: Secondary | ICD-10-CM | POA: Diagnosis not present

## 2015-03-17 DIAGNOSIS — M4806 Spinal stenosis, lumbar region: Secondary | ICD-10-CM | POA: Diagnosis not present

## 2015-03-17 DIAGNOSIS — M545 Low back pain: Secondary | ICD-10-CM

## 2015-03-19 ENCOUNTER — Telehealth: Payer: Self-pay | Admitting: *Deleted

## 2015-03-19 NOTE — Telephone Encounter (Signed)
Pt called requesting names of some counselors that accept medicare. I was going to give pt Marya Amsler name and #. Do you know any other counselors? Please advise

## 2015-03-19 NOTE — Telephone Encounter (Signed)
Good place to start I am not sure what counselors accept Medicare.

## 2015-03-20 DIAGNOSIS — M545 Low back pain: Secondary | ICD-10-CM | POA: Diagnosis not present

## 2015-03-20 NOTE — Telephone Encounter (Signed)
Left the below on pt voicemail. 

## 2015-03-24 ENCOUNTER — Other Ambulatory Visit: Payer: Self-pay

## 2015-03-27 DIAGNOSIS — Z6832 Body mass index (BMI) 32.0-32.9, adult: Secondary | ICD-10-CM | POA: Diagnosis not present

## 2015-03-27 DIAGNOSIS — M810 Age-related osteoporosis without current pathological fracture: Secondary | ICD-10-CM | POA: Diagnosis not present

## 2015-03-27 DIAGNOSIS — E780 Pure hypercholesterolemia: Secondary | ICD-10-CM | POA: Diagnosis not present

## 2015-03-27 DIAGNOSIS — K219 Gastro-esophageal reflux disease without esophagitis: Secondary | ICD-10-CM | POA: Diagnosis not present

## 2015-03-27 DIAGNOSIS — E890 Postprocedural hypothyroidism: Secondary | ICD-10-CM | POA: Diagnosis not present

## 2015-03-27 DIAGNOSIS — E669 Obesity, unspecified: Secondary | ICD-10-CM | POA: Diagnosis not present

## 2015-05-13 DIAGNOSIS — R197 Diarrhea, unspecified: Secondary | ICD-10-CM | POA: Diagnosis not present

## 2015-05-13 DIAGNOSIS — R1032 Left lower quadrant pain: Secondary | ICD-10-CM | POA: Diagnosis not present

## 2015-05-14 DIAGNOSIS — R197 Diarrhea, unspecified: Secondary | ICD-10-CM | POA: Diagnosis not present

## 2015-05-22 DIAGNOSIS — F4323 Adjustment disorder with mixed anxiety and depressed mood: Secondary | ICD-10-CM | POA: Diagnosis not present

## 2015-05-26 DIAGNOSIS — H01005 Unspecified blepharitis left lower eyelid: Secondary | ICD-10-CM | POA: Diagnosis not present

## 2015-05-26 DIAGNOSIS — H01002 Unspecified blepharitis right lower eyelid: Secondary | ICD-10-CM | POA: Diagnosis not present

## 2015-05-26 DIAGNOSIS — H01001 Unspecified blepharitis right upper eyelid: Secondary | ICD-10-CM | POA: Diagnosis not present

## 2015-05-26 DIAGNOSIS — H01004 Unspecified blepharitis left upper eyelid: Secondary | ICD-10-CM | POA: Diagnosis not present

## 2015-05-26 DIAGNOSIS — H16223 Keratoconjunctivitis sicca, not specified as Sjogren's, bilateral: Secondary | ICD-10-CM | POA: Diagnosis not present

## 2015-05-28 DIAGNOSIS — E890 Postprocedural hypothyroidism: Secondary | ICD-10-CM | POA: Diagnosis not present

## 2015-05-28 DIAGNOSIS — H0520 Unspecified exophthalmos: Secondary | ICD-10-CM | POA: Diagnosis not present

## 2015-05-28 DIAGNOSIS — Z23 Encounter for immunization: Secondary | ICD-10-CM | POA: Diagnosis not present

## 2015-05-28 DIAGNOSIS — R5383 Other fatigue: Secondary | ICD-10-CM | POA: Diagnosis not present

## 2015-05-28 DIAGNOSIS — R3915 Urgency of urination: Secondary | ICD-10-CM | POA: Diagnosis not present

## 2015-07-28 DIAGNOSIS — M25562 Pain in left knee: Secondary | ICD-10-CM | POA: Diagnosis not present

## 2015-08-06 DIAGNOSIS — M1712 Unilateral primary osteoarthritis, left knee: Secondary | ICD-10-CM | POA: Diagnosis not present

## 2015-08-06 DIAGNOSIS — M25562 Pain in left knee: Secondary | ICD-10-CM | POA: Diagnosis not present

## 2015-08-11 DIAGNOSIS — M1712 Unilateral primary osteoarthritis, left knee: Secondary | ICD-10-CM | POA: Diagnosis not present

## 2015-08-11 DIAGNOSIS — M25562 Pain in left knee: Secondary | ICD-10-CM | POA: Diagnosis not present

## 2015-08-13 DIAGNOSIS — M1712 Unilateral primary osteoarthritis, left knee: Secondary | ICD-10-CM | POA: Diagnosis not present

## 2015-08-13 DIAGNOSIS — M25562 Pain in left knee: Secondary | ICD-10-CM | POA: Diagnosis not present

## 2015-08-17 DIAGNOSIS — M25562 Pain in left knee: Secondary | ICD-10-CM | POA: Diagnosis not present

## 2015-08-17 DIAGNOSIS — M1712 Unilateral primary osteoarthritis, left knee: Secondary | ICD-10-CM | POA: Diagnosis not present

## 2015-08-19 DIAGNOSIS — L309 Dermatitis, unspecified: Secondary | ICD-10-CM | POA: Diagnosis not present

## 2015-08-19 DIAGNOSIS — L308 Other specified dermatitis: Secondary | ICD-10-CM | POA: Diagnosis not present

## 2015-08-20 DIAGNOSIS — M25562 Pain in left knee: Secondary | ICD-10-CM | POA: Diagnosis not present

## 2015-08-20 DIAGNOSIS — M1712 Unilateral primary osteoarthritis, left knee: Secondary | ICD-10-CM | POA: Diagnosis not present

## 2015-08-25 DIAGNOSIS — M25562 Pain in left knee: Secondary | ICD-10-CM | POA: Diagnosis not present

## 2015-08-25 DIAGNOSIS — M1712 Unilateral primary osteoarthritis, left knee: Secondary | ICD-10-CM | POA: Diagnosis not present

## 2015-08-28 DIAGNOSIS — M71571 Other bursitis, not elsewhere classified, right ankle and foot: Secondary | ICD-10-CM | POA: Diagnosis not present

## 2015-08-28 DIAGNOSIS — M7731 Calcaneal spur, right foot: Secondary | ICD-10-CM | POA: Diagnosis not present

## 2015-08-28 DIAGNOSIS — M7661 Achilles tendinitis, right leg: Secondary | ICD-10-CM | POA: Diagnosis not present

## 2015-09-04 DIAGNOSIS — M7661 Achilles tendinitis, right leg: Secondary | ICD-10-CM | POA: Diagnosis not present

## 2015-09-08 DIAGNOSIS — L308 Other specified dermatitis: Secondary | ICD-10-CM | POA: Diagnosis not present

## 2015-09-11 DIAGNOSIS — M1712 Unilateral primary osteoarthritis, left knee: Secondary | ICD-10-CM | POA: Diagnosis not present

## 2015-09-18 DIAGNOSIS — M1712 Unilateral primary osteoarthritis, left knee: Secondary | ICD-10-CM | POA: Diagnosis not present

## 2015-09-25 DIAGNOSIS — E7800 Pure hypercholesterolemia, unspecified: Secondary | ICD-10-CM | POA: Diagnosis not present

## 2015-09-25 DIAGNOSIS — R739 Hyperglycemia, unspecified: Secondary | ICD-10-CM | POA: Diagnosis not present

## 2015-09-25 DIAGNOSIS — M1712 Unilateral primary osteoarthritis, left knee: Secondary | ICD-10-CM | POA: Diagnosis not present

## 2015-09-28 DIAGNOSIS — E669 Obesity, unspecified: Secondary | ICD-10-CM | POA: Diagnosis not present

## 2015-09-28 DIAGNOSIS — E890 Postprocedural hypothyroidism: Secondary | ICD-10-CM | POA: Diagnosis not present

## 2015-09-28 DIAGNOSIS — Z6831 Body mass index (BMI) 31.0-31.9, adult: Secondary | ICD-10-CM | POA: Diagnosis not present

## 2015-09-28 DIAGNOSIS — Z5181 Encounter for therapeutic drug level monitoring: Secondary | ICD-10-CM | POA: Diagnosis not present

## 2015-09-28 DIAGNOSIS — Z0000 Encounter for general adult medical examination without abnormal findings: Secondary | ICD-10-CM | POA: Diagnosis not present

## 2015-09-28 DIAGNOSIS — M810 Age-related osteoporosis without current pathological fracture: Secondary | ICD-10-CM | POA: Diagnosis not present

## 2015-09-28 DIAGNOSIS — E7800 Pure hypercholesterolemia, unspecified: Secondary | ICD-10-CM | POA: Diagnosis not present

## 2015-09-28 DIAGNOSIS — Z1389 Encounter for screening for other disorder: Secondary | ICD-10-CM | POA: Diagnosis not present

## 2015-10-30 DIAGNOSIS — L308 Other specified dermatitis: Secondary | ICD-10-CM | POA: Diagnosis not present

## 2015-11-03 ENCOUNTER — Encounter: Payer: Self-pay | Admitting: Internal Medicine

## 2015-11-10 DIAGNOSIS — M25571 Pain in right ankle and joints of right foot: Secondary | ICD-10-CM | POA: Diagnosis not present

## 2015-11-10 DIAGNOSIS — Z1231 Encounter for screening mammogram for malignant neoplasm of breast: Secondary | ICD-10-CM | POA: Diagnosis not present

## 2015-11-12 ENCOUNTER — Encounter: Payer: Self-pay | Admitting: Gynecology

## 2015-11-14 DIAGNOSIS — M25571 Pain in right ankle and joints of right foot: Secondary | ICD-10-CM | POA: Diagnosis not present

## 2015-11-14 DIAGNOSIS — M25562 Pain in left knee: Secondary | ICD-10-CM | POA: Diagnosis not present

## 2015-11-16 ENCOUNTER — Encounter: Payer: Self-pay | Admitting: Internal Medicine

## 2015-11-18 ENCOUNTER — Telehealth: Payer: Self-pay | Admitting: *Deleted

## 2015-11-18 MED ORDER — CLOBETASOL PROPIONATE 0.05 % EX CREA: 1.0000 "application " | TOPICAL_CREAM | Freq: Two times a day (BID) | CUTANEOUS | Status: DC

## 2015-11-18 NOTE — Telephone Encounter (Signed)
Pt called requesting refill on temovate cream [id number] % for lichen sclerosis Rx expired last year. Please advise

## 2015-11-18 NOTE — Telephone Encounter (Signed)
Okay for refill of Temovate 0.05% cream

## 2015-11-18 NOTE — Telephone Encounter (Signed)
Pt informed with the below, Rx sent, when sending Rx the allergies can up with some of the medication listed. Pt has used this cream before with no problems.

## 2015-11-24 DIAGNOSIS — H9209 Otalgia, unspecified ear: Secondary | ICD-10-CM | POA: Diagnosis not present

## 2015-11-24 DIAGNOSIS — M25562 Pain in left knee: Secondary | ICD-10-CM | POA: Diagnosis not present

## 2015-11-24 DIAGNOSIS — H6983 Other specified disorders of Eustachian tube, bilateral: Secondary | ICD-10-CM | POA: Diagnosis not present

## 2015-11-24 DIAGNOSIS — H903 Sensorineural hearing loss, bilateral: Secondary | ICD-10-CM | POA: Diagnosis not present

## 2015-12-04 ENCOUNTER — Telehealth: Payer: Self-pay | Admitting: Internal Medicine

## 2015-12-04 MED ORDER — METRONIDAZOLE 500 MG PO TABS
500.0000 mg | ORAL_TABLET | Freq: Two times a day (BID) | ORAL | Status: DC
Start: 2015-12-04 — End: 2015-12-30

## 2015-12-04 MED ORDER — CIPROFLOXACIN HCL 500 MG PO TABS: 500.0000 mg | ORAL_TABLET | Freq: Two times a day (BID) | ORAL | Status: DC

## 2015-12-04 NOTE — Telephone Encounter (Signed)
Marilyn Rivas pt with history of diverticulitis. States she started having pain in her LLQ last night and did not sleep well. Pt reports the pain has gotten worse, she is bloated and nauseated. Pt does not think she has a fever. States she is usually given cipro and flagyl for this and is requesting scripts be sent to her pharmacy. Dr. Silverio Decamp as doc of the day please advise.

## 2015-12-04 NOTE — Telephone Encounter (Signed)
Ok to send Rx fro Cipro 500mg  anf flagyl 500mg  BID x 10 days and follow up in office visit. If her symptoms are worse, will need to come to ER

## 2015-12-04 NOTE — Telephone Encounter (Signed)
Spoke with pt and she is aware, scripts sent to the pharmacy.

## 2015-12-25 ENCOUNTER — Other Ambulatory Visit: Payer: Self-pay | Admitting: Physician Assistant

## 2015-12-25 DIAGNOSIS — M25562 Pain in left knee: Secondary | ICD-10-CM | POA: Diagnosis not present

## 2015-12-25 NOTE — H&P (Signed)
TOTAL KNEE ADMISSION H&P  Patient is being admitted for left partial knee arthroplasty.  Subjective:  Chief Complaint:left knee pain.  HPI: Marilyn Rivas, 75 y.o. female, has a history of pain and functional disability in the left knee due to arthritis and has failed non-surgical conservative treatments for greater than 12 weeks to includeNSAID's and/or analgesics and corticosteriod injections.  Onset of symptoms was gradual, starting >10 years ago with rapidlly worsening course since that time. The patient noted prior procedures on the knee to include  arthroscopy and menisectomy on the left knee(s).  Patient currently rates pain in the left knee(s) at 8 out of 10 with activity. Patient has night pain, worsening of pain with activity and weight bearing, pain that interferes with activities of daily living and crepitus.  Patient has evidence of subchondral sclerosis and joint space narrowing by imaging studies. There is no active infection.  Patient Active Problem List   Diagnosis Date Noted  . Abdominal pain, epigastric 07/07/2014  . Fatigue 04/08/2014  . Dyspnea on exertion 04/08/2014  . Hyperlipidemia 04/08/2014  . Abdominal pain, left lower quadrant 05/20/2013  . Nausea alone 03/06/2013  . LLQ pain 03/05/2013  . Hyperglycemia 10/29/2012  . Urge incontinence of urine 10/29/2012  . Osteoporosis 05/13/2012  . Basal cell cancer 12/17/2011  . Sinus bradycardia by electrocardiogram 11/15/2011  . Hypothyroidism 09/07/2011  . DJD (degenerative joint disease) 09/07/2011  . Atrophic vaginitis 09/07/2011  . Radiculopathy of lumbar region 09/07/2011  . GERD 09/08/2010  . DIVERTICULITIS, COLON 09/08/2010  . CONSTIPATION 10/09/2009  . PERSONAL HX COLONIC POLYPS 10/09/2009  . Mixed hyperlipidemia 04/10/2009   Past Medical History  Diagnosis Date  . Thyroid disease   . Hyperlipidemia   . Menopause   . Osteopenia   . Diverticulosis   . Diverticulitis   . Hx of adenomatous colonic  polyps   . Seasonal allergies   . Cataracts, both eyes   . GERD (gastroesophageal reflux disease)     HISTORY  . Arthritis   . Lichen sclerosus [id number]    Biopsy proven    Past Surgical History  Procedure Laterality Date  . Appendectomy  1977  . Right oophorectomy  1977  . Patella reconstruction Left 1994  . Cervical fusion  1999    C5-7  . Lumbar disc surgery  2004    L5  . Cataract surgery Bilateral 06/2012  . Dilatation & curettage/hysteroscopy with trueclear N/A 05/02/2013    Procedure: DILATATION & CURETTAGE/HYSTEROSCOPY WITH TRUECLEAR ;  Surgeon: Anastasio Auerbach, MD;  Location: [facility];  Service: Gynecology;  Laterality: N/A;  . Hysteroscopy    . Dilation and curettage of uterus    . Hardware removal Left 12/12/2013    Procedure:  LEFT PATELLA HARDWARE REMOVAL;  Surgeon: [name], MD;  Location: [facility];  Service: Orthopedics;  Laterality: Left;  . Knee arthroscopy Left 12/12/2013    Procedure: LEFT PATELLA ARTHROSCOPY KNEE WITH DEBRIDEMENT/SHAVING (CONDROPLASTY), LYSIS OF ADHESIONS;  Surgeon: [name], MD;  Location: [facility];  Service: Orthopedics;  Laterality: Left;  . Left heart catheterization with coronary angiogram N/A 04/22/2014    Procedure: LEFT HEART CATHETERIZATION WITH CORONARY ANGIOGRAM;  Surgeon: Sinclair Grooms, MD;  Location: [facility] CATH LAB;  Service: Cardiovascular;  Laterality: N/A;  . Eye lid surg       (Not in a hospital admission) Allergies  Allergen Reactions  . Aleve [Naproxen Sodium] Other (See Comments)    Severe  abdominal pain  . Aspirin Other (See Comments)    REACTION: severe abdominal pain and excessive salavation   . Atorvastatin Other (See Comments)    myalgia  . Crestor [Rosuvastatin] Other (See Comments)    Muscle aches  . Durezol [Difluprednate]     Eye redness    . Other     Paseo eye drops - causes eye redness  . Adhesive [Tape] Rash    Looks burned  . Clarithromycin Rash     Says allergic to "mycins"  . Penicillins Swelling and Rash    Hands swelling  . Zofran [Ondansetron Hcl] Other (See Comments)    Headache     Social History  Substance Use Topics  . Smoking status: Former Smoker    Quit date: 12/10/1983  . Smokeless tobacco: Never Used  . Alcohol Use: Yes     Comment: OCC    Family History  Problem Relation Age of Onset  . Allergies Mother   . Heart disease Father   . Colon cancer Neg Hx      Review of Systems  Constitutional: Negative.   HENT: Negative.   Eyes: Negative.   Respiratory: Negative.   Cardiovascular: Negative.   Gastrointestinal: Negative.   Genitourinary: Negative.   Musculoskeletal: Positive for joint pain.  Skin: Negative.   Neurological: Negative.   Endo/Heme/Allergies: Negative.   Psychiatric/Behavioral: Negative.     Objective:  Physical Exam  Constitutional: She is oriented to person, place, and time. She appears well-developed and well-nourished.  HENT:  Head: Normocephalic and atraumatic.  Eyes: EOM are normal. Pupils are equal, round, and reactive to light.  Neck: Normal range of motion. Neck supple.  Cardiovascular: Normal rate and regular rhythm.   Respiratory: Effort normal and breath sounds normal.  GI: Soft. Bowel sounds are normal.  Musculoskeletal:  Specifically, left knee profound patellofemoral crepitus.  Pain with compression.  Ligaments are stable.  Alignment is good.  I am really not getting any tenderness medially or laterally.    Neurological: She is alert and oriented to person, place, and time.  Skin: Skin is warm and dry.  Psychiatric: She has a normal mood and affect. Her behavior is normal. Judgment and thought content normal.    Vital signs in last 24 hours: @VSRANGES @  Labs:   Estimated body mass index is 32.05 kg/(m^2) as calculated from the following:   Height as of 02/20/15: 5' (1.524 m).   Weight as of 02/20/15: 74.447 kg (164 lb 2 oz).   Imaging Review Plain radiographs  demonstrate severe degenerative joint disease of the left knee(s). The overall alignment isneutral. The bone quality appears to be fair for age and reported activity level.  Assessment/Plan:  End stage arthritis, left knee   The patient history, physical examination, clinical judgment of the provider and imaging studies are consistent with end stage degenerative joint disease of the left knee(s) and total knee arthroplasty is deemed medically necessary. The treatment options including medical management, injection therapy arthroscopy and arthroplasty were discussed at length. The risks and benefits of total knee arthroplasty were presented and reviewed. The risks due to aseptic loosening, infection, stiffness, patella tracking problems, thromboembolic complications and other imponderables were discussed. The patient acknowledged the explanation, agreed to proceed with the plan and consent was signed. Patient is being admitted for inpatient treatment for surgery, pain control, PT, OT, prophylactic antibiotics, VTE prophylaxis, progressive ambulation and ADL's and discharge planning. The patient is planning to be discharged home with home health services

## 2015-12-28 ENCOUNTER — Encounter: Payer: Self-pay | Admitting: Cardiology

## 2015-12-28 ENCOUNTER — Ambulatory Visit (INDEPENDENT_AMBULATORY_CARE_PROVIDER_SITE_OTHER): Payer: Medicare Other | Admitting: Cardiology

## 2015-12-28 VITALS — BP 126/62 | HR 66 | Temp 98.0°F | Ht 60.0 in | Wt 160.0 lb

## 2015-12-28 DIAGNOSIS — E785 Hyperlipidemia, unspecified: Secondary | ICD-10-CM | POA: Diagnosis not present

## 2015-12-28 DIAGNOSIS — R9431 Abnormal electrocardiogram [ECG] [EKG]: Secondary | ICD-10-CM | POA: Diagnosis not present

## 2015-12-28 DIAGNOSIS — R0600 Dyspnea, unspecified: Secondary | ICD-10-CM

## 2015-12-28 DIAGNOSIS — Z889 Allergy status to unspecified drugs, medicaments and biological substances status: Secondary | ICD-10-CM

## 2015-12-28 DIAGNOSIS — Z789 Other specified health status: Secondary | ICD-10-CM

## 2015-12-28 DIAGNOSIS — R0609 Other forms of dyspnea: Secondary | ICD-10-CM

## 2015-12-28 NOTE — Progress Notes (Signed)
Patient ID: [name], female   DOB: Sep 18, 1940, 75 y.o.   MRN: [id number]    Patient Name: Marilyn Rivas Date of Encounter: 12/28/2015  Primary Care Provider:  Irven Shelling, MD Primary Cardiologist:  Dorothy Spark  Problem List   Past Medical History  Diagnosis Date  . Thyroid disease   . Hyperlipidemia   . Menopause   . Osteopenia   . Diverticulosis   . Diverticulitis   . Hx of adenomatous colonic polyps   . Seasonal allergies   . Cataracts, both eyes   . GERD (gastroesophageal reflux disease)     HISTORY  . Arthritis   . Lichen sclerosus [id number]    Biopsy proven   Past Surgical History  Procedure Laterality Date  . Appendectomy  1977  . Right oophorectomy  1977  . Patella reconstruction Left 1994  . Cervical fusion  1999    C5-7  . Lumbar disc surgery  2004    L5  . Cataract surgery Bilateral 06/2012  . Dilatation & curettage/hysteroscopy with trueclear N/A 05/02/2013    Procedure: DILATATION & CURETTAGE/HYSTEROSCOPY WITH TRUECLEAR ;  Surgeon: Anastasio Auerbach, MD;  Location: [facility];  Service: Gynecology;  Laterality: N/A;  . Hysteroscopy    . Dilation and curettage of uterus    . Hardware removal Left 12/12/2013    Procedure:  LEFT PATELLA HARDWARE REMOVAL;  Surgeon: [name], MD;  Location: [facility];  Service: Orthopedics;  Laterality: Left;  . Knee arthroscopy Left 12/12/2013    Procedure: LEFT PATELLA ARTHROSCOPY KNEE WITH DEBRIDEMENT/SHAVING (CONDROPLASTY), LYSIS OF ADHESIONS;  Surgeon: [name], MD;  Location: [facility];  Service: Orthopedics;  Laterality: Left;  . Left heart catheterization with coronary angiogram N/A 04/22/2014    Procedure: LEFT HEART CATHETERIZATION WITH CORONARY ANGIOGRAM;  Surgeon: Sinclair Grooms, MD;  Location: [facility] CATH [facility];  Service: Cardiovascular;  Laterality: N/A;  . Eye lid surg     Allergies  Allergies  Allergen Reactions  . Aleve [Naproxen Sodium] Other  (See Comments)    Severe abdominal pain  . Aspirin Other (See Comments)    REACTION: severe abdominal pain and excessive salavation   . Atorvastatin Other (See Comments)    myalgia  . Crestor [Rosuvastatin] Other (See Comments)    Muscle aches  . Durezol [Difluprednate]     Eye redness    . Other     Paseo eye drops - causes eye redness  . Adhesive [Tape] Rash    Looks burned  . Clarithromycin Rash    Says allergic to "mycins"  . Penicillins Swelling and Rash    Hands swelling  . Zofran [Ondansetron Hcl] Other (See Comments)    Headache    HPI  A very pleasant 75 year old female who is coming for concern of dyspnea on exertion and fatigue. The patient is generally very active she exercises twice a week in the gym and walks almost daily. She has noticed recently that occasionally she gets short of breath on exertion but overall she feels very tired. The patient has no prior history of cardiac disease. She has never had an echocardiogram. She has known history of hyperlipidemia there was recently started on Crestor 5 mg daily. She reports muscle cramping and is scheduled for a blood work next week. The patient denies any chest pain, palpitation or recent syncope. She has smoked 30 years ago. Her father had myocardial infarction at age of 67 and underwent  quadruple bypass and AAA repair at age of 18. Her daughter has a pacemaker for unknown reason.  06/17/2014 - negative cardiac cath - normal coronaries despite abnormal stress test. No chest pain, no SOB. Continues to have muscle cramping despite normal LFTS. She stopped using crestor. No palpitations, LE edema, orthopnea or syncope.  12/28/2015 - the patient is coming after 1 year, she feels very well, denies chest pain or SOB, her limitation is knee pain and strained Achilles tendon, she is undergoing a knee replacement in a week.  She used to go to [employer] sne[employer] 3/week, but now is unable, however excite to start again. Maintains  healthy diet.  Home Medications  Prior to Admission medications   Medication Sig Start Date End Date Taking? Authorizing Provider  acetaminophen (TYLENOL) 325 MG tablet Take 325 mg by mouth every 6 (six) hours as needed.   Yes Historical Provider, MD  Calcium Citrate-Vitamin D (CALCIUM CITRATE + PO) Take 4 capsules by mouth daily. Contain 800mg  calcium citrate and other vitamins D,C, K, Mg, zinc, copper   Yes Historical Provider, MD  Cholecalciferol (VITAMIN D3) 5000 UNITS TABS Take 1 tablet by mouth daily.   Yes Historical Provider, MD  diphenhydramine-acetaminophen (TYLENOL PM) 25-500 MG TABS Take 1 tablet by mouth at bedtime as needed.   Yes Historical Provider, MD  hyoscyamine (LEVSIN, ANASPAZ) 0.125 MG tablet Take 0.125 mg by mouth every 4 (four) hours as needed for cramping.   Yes Historical Provider, MD  levothyroxine (SYNTHROID, LEVOTHROID) 112 MCG tablet Take 1 tablet (112 mcg total) by mouth daily. 06/29/12  Yes Lanice Shirts, MD  omeprazole (PRILOSEC OTC) 20 MG tablet Take 20 mg by mouth daily.   Yes Historical Provider, MD  Probiotic Product (PROBIOTIC PO) Take 1-2 capsules by mouth as needed.    Yes Historical Provider, MD  rosuvastatin (CRESTOR) 5 MG tablet Take 5 mg by mouth 2 (two) times a week. 03/06/14  Yes Philemon Kingdom, MD    Family History  Family History  Problem Relation Age of Onset  . Allergies Mother   . Heart disease Father   . Colon cancer Neg Hx     Social History  Social History   Social History  . Marital Status: Single    Spouse Name: N/A  . Number of Children: 2  . Years of Education: N/A   Occupational History  . RESEARCH ASSIST     Retired    Social History Main Topics  . Smoking status: Former Smoker    Quit date: 12/10/1983  . Smokeless tobacco: Never Used  . Alcohol Use: Yes     Comment: OCC  . Drug Use: No  . Sexual Activity: No   Other Topics Concern  . Not on file   Social History Narrative   Daily caffeine       Review of Systems, as per HPI, otherwise negative General:  No chills, fever, night sweats or weight changes.  Cardiovascular:  No chest pain, dyspnea on exertion, edema, orthopnea, palpitations, paroxysmal nocturnal dyspnea. Dermatological: No rash, lesions/masses Respiratory: No cough, dyspnea Urologic: No hematuria, dysuria Abdominal:   No nausea, vomiting, diarrhea, bright red blood per rectum, melena, or hematemesis Neurologic:  No visual changes, wkns, changes in mental status. All other systems reviewed and are otherwise negative except as noted above.  Physical Exam  Blood pressure 126/62, pulse 66, temperature 98 F (36.7 C), height 5' (1.524 m), weight 160 lb (72.576 kg).  General: Pleasant, NAD Psych: Normal affect.  Neuro: Alert and oriented X 3. Moves all extremities spontaneously. HEENT: Normal  Neck: Supple without bruits or JVD. Lungs:  Resp regular and unlabored, CTA. Heart: RRR no s3, s4, or murmurs. Abdomen: Soft, non-tender, non-distended, BS + x 4.  Extremities: No clubbing, cyanosis or edema. DP/PT/Radials 2+ and equal bilaterally.  Labs:  No results for input(s): CKTOTAL, CKMB, TROPONINI in the last 72 hours. Lab Results  Component Value Date   WBC 7.1 10/07/2014   HGB 15.3* 10/07/2014   HCT 43.7 10/07/2014   MCV 89.9 10/07/2014   PLT 285 10/07/2014    Lab Results  Component Value Date   DDIMER 0.29 06/06/2014   Invalid input(s): POCBNP    Component Value Date/Time   NA 140 02/20/2015 1120   K 4.0 02/20/2015 1120   CL 106 02/20/2015 1120   CO2 30 02/20/2015 1120   GLUCOSE 118* 02/20/2015 1120   BUN 8 02/20/2015 1120   CREATININE 0.76 02/20/2015 1120   CREATININE 0.64 10/07/2014 1202   CALCIUM 9.7 02/20/2015 1120   PROT 6.7 10/07/2014 1202   ALBUMIN 4.4 10/07/2014 1202   AST 32 10/07/2014 1202   ALT 33 10/07/2014 1202   ALKPHOS 75 10/07/2014 1202   BILITOT 0.7 10/07/2014 1202   GFRNONAA 84* 04/23/2013 1237   GFRAA >90 04/23/2013 1237    Lab Results  Component Value Date   CHOL 183 07/15/2014   HDL 50.70 07/15/2014   LDLCALC 107* 07/15/2014   TRIG 126.0 07/15/2014    Accessory Clinical Findings  Echocardiogram - none  ECG - 02/19/2014 - sinus bradycardia, diffuse negative T waves in inferior anterolateral leads  Left cardiac cath: LEFT VENTRICULOGRAM: Left ventricular angiogram was done in the 30 RAO projection and revealed normal cavity size and EF 60%.  IMPRESSIONS: 1. Normal coronary arteries  2. Normal left ventricular systolic function with EF 60% and normal hemodynamics  3. Dyspnea not likely related to cardiac etiology  RECOMMENDATION: Management per primary team with other considerations being pulmonary sources of dyspnea, deconditioning, and sleep apnea.Marland Kitchen   ECG - SR, negative T waves in the inferior and anterolateral leads, unchanged from prior in 2015   Assessment & Plan  1. Abnormal ECG - with diffuse negative T waves in inferior and anterolateral leads, abnormal stress test, however normal cath.  She is asymptomatic, and active.  2. Blood pressure - well controlled  3. Hyperlipidemia - LDL 180, significant muscle pain with rosuvastatin and livalo, she had a great response LDL 180 --> 107. I verified with Elberta Leatherwood and she currently doesn't qualify for [employer] approval by insurance or for a study. Considering she doesn't have DM and had a normal cath at age 40 she is just advised to continue with healthy diet and exercise.  Follow up in 1 year.     Dorothy Spark, MD, [facility] 12/28/2015, 11:18 AM

## 2015-12-28 NOTE — Patient Instructions (Signed)
Medication Instructions:  Your physician recommends that you continue on your current medications as directed. Please refer to the Current Medication list given to you today.   Labwork: None   Testing/Procedures: None   Follow-Up: Your physician wants you to follow-up in: 1 year with Dr Meda Coffee. (May 2018). You will receive a reminder letter in the mail two months in advance. If you don't receive a letter, please call our office to schedule the follow-up appointment.        If you need a refill on your cardiac medications before your next appointment, please call your pharmacy.

## 2015-12-31 ENCOUNTER — Encounter (HOSPITAL_COMMUNITY): Payer: Self-pay

## 2015-12-31 NOTE — Pre-Procedure Instructions (Signed)
    Marilyn Rivas  12/31/2015      [facility] - [place], [place] - [address] AT [facility] [address] [place] [address] Phone: [phone] Fax: [phone]    Your procedure is scheduled on 01-13-2016    Wednesday .  Report to [facility] Admitting at 6:30 A.M.   Call this number if you have problems the morning of surgery:  [phone]   Remember:  Do not eat food or drink liquids after midnight.   Take these medicines the morning of surgery with A SIP OF WATER Tylenol if needed,Synthroid,omeprazole(Prilosec),levsin/sl if needed   Do not wear jewelry, make-up or nail polish.  Do not wear lotions, powders, or perfumes.  You may not wear deodorant.  Do not shave 48 hours prior to surgery.  .  Do not bring valuables to the hospital.  [facility] is not responsible for any belongings or valuables.  Contacts, dentures or bridgework may not be worn into surgery.  Leave your suitcase in the car.  After surgery it may be brought to your room.  For patients admitted to the hospital, discharge time will be determined by your treatment team.  Patients discharged the day of surgery will not be allowed to drive home.    Special instructions:  See Attached Sheet for instructions on CHG showers  Please read over the following fact sheets that you were given. Pain Booklet, Coughing and Deep Breathing, Blood Transfusion Information and Surgical Site Infection Prevention

## 2016-01-01 ENCOUNTER — Encounter (HOSPITAL_COMMUNITY)
Admission: RE | Admit: 2016-01-01 | Discharge: 2016-01-01 | Disposition: A | Discharge status: Home / Self Care | Payer: Medicare Other | Source: Ambulatory Visit | Attending: Orthopedic Surgery | Admitting: Orthopedic Surgery

## 2016-01-01 ENCOUNTER — Encounter (HOSPITAL_COMMUNITY): Payer: Self-pay

## 2016-01-01 DIAGNOSIS — M1712 Unilateral primary osteoarthritis, left knee: Secondary | ICD-10-CM | POA: Diagnosis not present

## 2016-01-01 DIAGNOSIS — Z01812 Encounter for preprocedural laboratory examination: Secondary | ICD-10-CM | POA: Diagnosis not present

## 2016-01-01 DIAGNOSIS — Z0183 Encounter for blood typing: Secondary | ICD-10-CM | POA: Diagnosis not present

## 2016-01-01 HISTORY — DX: Major depressive disorder, single episode, unspecified: F32.9

## 2016-01-01 HISTORY — DX: Depression, unspecified: F32.A

## 2016-01-01 HISTORY — DX: Hypothyroidism, unspecified: E03.9

## 2016-01-01 HISTORY — DX: Malignant (primary) neoplasm, unspecified: C80.1

## 2016-01-01 HISTORY — DX: Anxiety disorder, unspecified: F41.9

## 2016-01-01 LAB — BASIC METABOLIC PANEL
ANION GAP: 9 (ref 5–15)
BUN: 10 mg/dL (ref 6–20)
CALCIUM: 10.2 mg/dL (ref 8.9–10.3)
CO2: 27 mmol/L (ref 22–32)
CREATININE: 0.62 mg/dL (ref 0.44–1.00)
Chloride: 105 mmol/L (ref 101–111)
Glucose, Bld: 100 mg/dL — ABNORMAL HIGH (ref 65–99)
Potassium: 4.3 mmol/L (ref 3.5–5.1)
SODIUM: 141 mmol/L (ref 135–145)

## 2016-01-01 LAB — CBC
HEMATOCRIT: 44.3 % (ref 36.0–46.0)
Hemoglobin: 14.9 g/dL (ref 12.0–15.0)
MCH: 31.3 pg (ref 26.0–34.0)
MCHC: 33.6 g/dL (ref 30.0–36.0)
MCV: 93.1 fL (ref 78.0–100.0)
PLATELETS: 266 10*3/uL (ref 150–400)
RBC: 4.76 MIL/uL (ref 3.87–5.11)
RDW: 12.3 % (ref 11.5–15.5)
WBC: 6.7 10*3/uL (ref 4.0–10.5)

## 2016-01-01 LAB — SURGICAL PCR SCREEN
MRSA, PCR: NEGATIVE
STAPHYLOCOCCUS AUREUS: NEGATIVE

## 2016-01-01 LAB — TYPE AND SCREEN
ABO/RH(D): O POS
ANTIBODY SCREEN: NEGATIVE

## 2016-01-01 LAB — ABO/RH: ABO/RH(D): O POS

## 2016-01-12 MED ORDER — TRANEXAMIC ACID 1000 MG/10ML IV SOLN
1000.0000 mg | INTRAVENOUS | Status: AC
  Administered 2016-01-13: 1000 mg via INTRAVENOUS
  Filled 2016-01-12: qty 10

## 2016-01-13 ENCOUNTER — Encounter (HOSPITAL_COMMUNITY): Payer: Self-pay | Admitting: *Deleted

## 2016-01-13 ENCOUNTER — Inpatient Hospital Stay (HOSPITAL_COMMUNITY): Payer: Medicare Other

## 2016-01-13 ENCOUNTER — Encounter (HOSPITAL_COMMUNITY)
Admission: RE | Disposition: A | Discharge status: Home Health | Payer: Self-pay | Source: Ambulatory Visit | Attending: Orthopedic Surgery

## 2016-01-13 ENCOUNTER — Inpatient Hospital Stay (HOSPITAL_COMMUNITY)
Admission: RE | Admit: 2016-01-13 | Discharge: 2016-01-14 | DRG: 470 | Disposition: A | Discharge status: Home Health | Payer: Medicare Other | Source: Ambulatory Visit | Attending: Orthopedic Surgery | Admitting: Orthopedic Surgery

## 2016-01-13 ENCOUNTER — Inpatient Hospital Stay (HOSPITAL_COMMUNITY): Payer: Medicare Other | Admitting: Anesthesiology

## 2016-01-13 DIAGNOSIS — K219 Gastro-esophageal reflux disease without esophagitis: Secondary | ICD-10-CM | POA: Diagnosis present

## 2016-01-13 DIAGNOSIS — Z888 Allergy status to other drugs, medicaments and biological substances status: Secondary | ICD-10-CM | POA: Diagnosis not present

## 2016-01-13 DIAGNOSIS — J302 Other seasonal allergic rhinitis: Secondary | ICD-10-CM | POA: Diagnosis present

## 2016-01-13 DIAGNOSIS — M2242 Chondromalacia patellae, left knee: Secondary | ICD-10-CM | POA: Diagnosis not present

## 2016-01-13 DIAGNOSIS — Z87891 Personal history of nicotine dependence: Secondary | ICD-10-CM | POA: Diagnosis not present

## 2016-01-13 DIAGNOSIS — Z886 Allergy status to analgesic agent status: Secondary | ICD-10-CM | POA: Diagnosis not present

## 2016-01-13 DIAGNOSIS — M179 Osteoarthritis of knee, unspecified: Secondary | ICD-10-CM | POA: Diagnosis not present

## 2016-01-13 DIAGNOSIS — M810 Age-related osteoporosis without current pathological fracture: Secondary | ICD-10-CM | POA: Diagnosis present

## 2016-01-13 DIAGNOSIS — Z471 Aftercare following joint replacement surgery: Secondary | ICD-10-CM | POA: Diagnosis not present

## 2016-01-13 DIAGNOSIS — E039 Hypothyroidism, unspecified: Secondary | ICD-10-CM | POA: Diagnosis present

## 2016-01-13 DIAGNOSIS — Z96652 Presence of left artificial knee joint: Secondary | ICD-10-CM

## 2016-01-13 DIAGNOSIS — M1712 Unilateral primary osteoarthritis, left knee: Principal | ICD-10-CM | POA: Diagnosis present

## 2016-01-13 HISTORY — PX: REPLACEMENT UNICONDYLAR JOINT KNEE: SUR1227

## 2016-01-13 HISTORY — DX: Family history of other specified conditions: Z84.89

## 2016-01-13 HISTORY — PX: PARTIAL KNEE ARTHROPLASTY: SHX2174

## 2016-01-13 HISTORY — DX: Personal history of other medical treatment: Z92.89

## 2016-01-13 HISTORY — DX: Pneumonia, unspecified organism: J18.9

## 2016-01-13 SURGERY — ARTHROPLASTY, KNEE, UNICOMPARTMENTAL
Anesthesia: Spinal | Site: Knee | Laterality: Left

## 2016-01-13 MED ORDER — SENNOSIDES-DOCUSATE SODIUM 8.6-50 MG PO TABS: 1.0000 | ORAL_TABLET | Freq: Every evening | ORAL | Status: DC | PRN

## 2016-01-13 MED ORDER — SENNA 8.6 MG PO TABS
1.0000 | ORAL_TABLET | Freq: Two times a day (BID) | ORAL | Status: DC
  Administered 2016-01-13 – 2016-01-14 (×3): 8.6 mg via ORAL
  Filled 2016-01-13 (×3): qty 1

## 2016-01-13 MED ORDER — FENTANYL CITRATE (PF) 250 MCG/5ML IJ SOLN
INTRAMUSCULAR | Status: AC
  Filled 2016-01-13: qty 5

## 2016-01-13 MED ORDER — DEXAMETHASONE SODIUM PHOSPHATE 10 MG/ML IJ SOLN
INTRAMUSCULAR | Status: DC | PRN
  Administered 2016-01-13 (×2): 5 mg via INTRAVENOUS

## 2016-01-13 MED ORDER — MIDAZOLAM HCL 2 MG/2ML IJ SOLN
INTRAMUSCULAR | Status: AC
  Filled 2016-01-13: qty 2

## 2016-01-13 MED ORDER — BISACODYL 10 MG RE SUPP: 10.0000 mg | Freq: Every day | RECTAL | Status: DC | PRN

## 2016-01-13 MED ORDER — ZOLPIDEM TARTRATE 5 MG PO TABS: 5.0000 mg | ORAL_TABLET | Freq: Every evening | ORAL | Status: DC | PRN

## 2016-01-13 MED ORDER — BUPIVACAINE LIPOSOME 1.3 % IJ SUSP
INTRAMUSCULAR | Status: DC | PRN
  Administered 2016-01-13: 20 mL

## 2016-01-13 MED ORDER — APIXABAN 2.5 MG PO TABS
2.5000 mg | ORAL_TABLET | Freq: Two times a day (BID) | ORAL | Status: DC
  Administered 2016-01-14: 2.5 mg via ORAL
  Filled 2016-01-13: qty 1

## 2016-01-13 MED ORDER — APIXABAN 2.5 MG PO TABS: ORAL_TABLET | ORAL | Status: DC

## 2016-01-13 MED ORDER — LACTATED RINGERS IV SOLN
INTRAVENOUS | Status: DC
  Administered 2016-01-13: 08:00:00 via INTRAVENOUS

## 2016-01-13 MED ORDER — PROPOFOL 10 MG/ML IV BOLUS
INTRAVENOUS | Status: AC
  Filled 2016-01-13: qty 20

## 2016-01-13 MED ORDER — METOCLOPRAMIDE HCL 5 MG/ML IJ SOLN
5.0000 mg | Freq: Three times a day (TID) | INTRAMUSCULAR | Status: DC | PRN
  Administered 2016-01-13: 10 mg via INTRAVENOUS
  Filled 2016-01-13: qty 2

## 2016-01-13 MED ORDER — ROCURONIUM BROMIDE 50 MG/5ML IV SOLN
INTRAVENOUS | Status: AC
  Filled 2016-01-13: qty 1

## 2016-01-13 MED ORDER — FENTANYL CITRATE (PF) 100 MCG/2ML IJ SOLN
INTRAMUSCULAR | Status: DC | PRN
  Administered 2016-01-13 (×5): 50 ug via INTRAVENOUS

## 2016-01-13 MED ORDER — HYDROMORPHONE HCL 1 MG/ML IJ SOLN
0.5000 mg | INTRAMUSCULAR | Status: DC | PRN
  Administered 2016-01-14: 1 mg via INTRAVENOUS
  Filled 2016-01-13: qty 1

## 2016-01-13 MED ORDER — PROPOFOL 10 MG/ML IV BOLUS
INTRAVENOUS | Status: DC | PRN
  Administered 2016-01-13: 150 mg via INTRAVENOUS

## 2016-01-13 MED ORDER — OXYCODONE-ACETAMINOPHEN 5-325 MG PO TABS: 1.0000 | ORAL_TABLET | ORAL | Status: DC | PRN

## 2016-01-13 MED ORDER — LIDOCAINE HCL (CARDIAC) 20 MG/ML IV SOLN
INTRAVENOUS | Status: DC | PRN
  Administered 2016-01-13: 60 mg via INTRAVENOUS

## 2016-01-13 MED ORDER — LEVOTHYROXINE SODIUM 112 MCG PO TABS
112.0000 ug | ORAL_TABLET | Freq: Every day | ORAL | Status: DC
  Administered 2016-01-14: 112 ug via ORAL
  Filled 2016-01-13: qty 1

## 2016-01-13 MED ORDER — CHLORHEXIDINE GLUCONATE 4 % EX LIQD: 60.0000 mL | Freq: Once | CUTANEOUS | Status: AC

## 2016-01-13 MED ORDER — ONDANSETRON HCL 4 MG/2ML IJ SOLN
INTRAMUSCULAR | Status: AC
  Filled 2016-01-13: qty 2

## 2016-01-13 MED ORDER — SODIUM CHLORIDE 0.9 % IR SOLN
Status: DC | PRN
  Administered 2016-01-13: 3000 mL

## 2016-01-13 MED ORDER — ACETAMINOPHEN 650 MG RE SUPP: 650.0000 mg | Freq: Four times a day (QID) | RECTAL | Status: DC | PRN

## 2016-01-13 MED ORDER — DIPHENHYDRAMINE HCL 12.5 MG/5ML PO ELIX: 12.5000 mg | ORAL_SOLUTION | ORAL | Status: DC | PRN

## 2016-01-13 MED ORDER — MIDAZOLAM HCL 5 MG/5ML IJ SOLN
INTRAMUSCULAR | Status: DC | PRN
  Administered 2016-01-13 (×2): 1 mg via INTRAVENOUS

## 2016-01-13 MED ORDER — VANCOMYCIN HCL IN DEXTROSE 1-5 GM/200ML-% IV SOLN
1000.0000 mg | Freq: Two times a day (BID) | INTRAVENOUS | Status: AC
  Administered 2016-01-13: 1000 mg via INTRAVENOUS
  Filled 2016-01-13: qty 200

## 2016-01-13 MED ORDER — OXYCODONE HCL 5 MG PO TABS
5.0000 mg | ORAL_TABLET | ORAL | Status: DC | PRN
  Administered 2016-01-13 (×2): 10 mg via ORAL
  Administered 2016-01-13: 5 mg via ORAL
  Administered 2016-01-13 – 2016-01-14 (×4): 10 mg via ORAL
  Filled 2016-01-13 (×6): qty 2
  Filled 2016-01-13: qty 1

## 2016-01-13 MED ORDER — VANCOMYCIN HCL IN DEXTROSE 1-5 GM/200ML-% IV SOLN
INTRAVENOUS | Status: AC
  Filled 2016-01-13: qty 200

## 2016-01-13 MED ORDER — MENTHOL 3 MG MT LOZG: 1.0000 | LOZENGE | OROMUCOSAL | Status: DC | PRN

## 2016-01-13 MED ORDER — 0.9 % SODIUM CHLORIDE (POUR BTL) OPTIME
TOPICAL | Status: DC | PRN
  Administered 2016-01-13: 1000 mL

## 2016-01-13 MED ORDER — METOCLOPRAMIDE HCL 5 MG PO TABS: 5.0000 mg | ORAL_TABLET | Freq: Three times a day (TID) | ORAL | Status: DC | PRN

## 2016-01-13 MED ORDER — HYDROMORPHONE HCL 1 MG/ML IJ SOLN
0.2500 mg | INTRAMUSCULAR | Status: DC | PRN
  Administered 2016-01-13 (×2): 0.5 mg via INTRAVENOUS

## 2016-01-13 MED ORDER — MAGNESIUM CITRATE PO SOLN: 1.0000 | Freq: Once | ORAL | Status: DC | PRN

## 2016-01-13 MED ORDER — PHENOL 1.4 % MT LIQD: 1.0000 | OROMUCOSAL | Status: DC | PRN

## 2016-01-13 MED ORDER — SODIUM CHLORIDE 0.9 % IJ SOLN
INTRAMUSCULAR | Status: DC | PRN
  Administered 2016-01-13: 40 mL via INTRAVENOUS

## 2016-01-13 MED ORDER — POTASSIUM CHLORIDE IN NACL 20-0.9 MEQ/L-% IV SOLN
INTRAVENOUS | Status: DC
  Administered 2016-01-13: 12:00:00 via INTRAVENOUS
  Administered 2016-01-13 – 2016-01-14 (×2): 100 mL/h via INTRAVENOUS
  Filled 2016-01-13 (×2): qty 1000

## 2016-01-13 MED ORDER — PANTOPRAZOLE SODIUM 40 MG PO TBEC
40.0000 mg | DELAYED_RELEASE_TABLET | Freq: Every day | ORAL | Status: DC
  Administered 2016-01-13 – 2016-01-14 (×2): 40 mg via ORAL
  Filled 2016-01-13 (×2): qty 1

## 2016-01-13 MED ORDER — HYDROMORPHONE HCL 1 MG/ML IJ SOLN
INTRAMUSCULAR | Status: AC
  Administered 2016-01-13: 0.5 mg via INTRAVENOUS
  Filled 2016-01-13: qty 1

## 2016-01-13 MED ORDER — OMEPRAZOLE MAGNESIUM 20 MG PO TBEC: 20.0000 mg | DELAYED_RELEASE_TABLET | Freq: Every day | ORAL | Status: DC | PRN

## 2016-01-13 MED ORDER — VANCOMYCIN HCL IN DEXTROSE 1-5 GM/200ML-% IV SOLN
1000.0000 mg | INTRAVENOUS | Status: AC
  Administered 2016-01-13: 1000 mg via INTRAVENOUS

## 2016-01-13 MED ORDER — BUPIVACAINE LIPOSOME 1.3 % IJ SUSP
20.0000 mL | INTRAMUSCULAR | Status: DC
  Filled 2016-01-13: qty 20

## 2016-01-13 MED ORDER — DIAZEPAM 2 MG PO TABS: 2.0000 mg | ORAL_TABLET | Freq: Three times a day (TID) | ORAL | Status: DC | PRN

## 2016-01-13 MED ORDER — ACETAMINOPHEN 325 MG PO TABS: 650.0000 mg | ORAL_TABLET | Freq: Four times a day (QID) | ORAL | Status: DC | PRN

## 2016-01-13 MED ORDER — BUPIVACAINE HCL (PF) 0.25 % IJ SOLN
INTRAMUSCULAR | Status: AC
  Filled 2016-01-13: qty 30

## 2016-01-13 SURGICAL SUPPLY — 67 items
BANDAGE ACE 6X5 VEL STRL LF (GAUZE/BANDAGES/DRESSINGS) ×3 IMPLANT
BANDAGE ESMARK 6X9 LF (GAUZE/BANDAGES/DRESSINGS) ×1 IMPLANT
BLADE SAG 18X100X1.27 (BLADE) ×3 IMPLANT
BLADE SAW SGTL 13.0X1.19X90.0M (BLADE) ×3 IMPLANT
BNDG ESMARK 6X9 LF (GAUZE/BANDAGES/DRESSINGS) ×3
BOOTCOVER CLEANROOM LRG (PROTECTIVE WEAR) IMPLANT
BOWL SMART MIX CTS (DISPOSABLE) ×3 IMPLANT
BUR SURG 4X8 MED (BURR) IMPLANT
BURR SURG 4MMX8MM MEDIUM (BURR)
BURR SURG 4X8 MED (BURR)
CEMENT BONE SIMPLEX SPEEDSET (Cement) ×6 IMPLANT
COVER BACK TABLE 24X17X13 BIG (DRAPES) IMPLANT
COVER SURGICAL LIGHT HANDLE (MISCELLANEOUS) ×3 IMPLANT
CUFF TOURNIQUET SINGLE 34IN LL (TOURNIQUET CUFF) ×3 IMPLANT
DRAPE EXTREMITY T 121X128X90 (DRAPE) ×3 IMPLANT
DRAPE IMP U-DRAPE 54X76 (DRAPES) ×3 IMPLANT
DRAPE PROXIMA HALF (DRAPES) ×3 IMPLANT
DRAPE U-SHAPE 47X51 STRL (DRAPES) ×3 IMPLANT
DRSG ADAPTIC 3X8 NADH LF (GAUZE/BANDAGES/DRESSINGS) ×3 IMPLANT
DRSG PAD ABDOMINAL 8X10 ST (GAUZE/BANDAGES/DRESSINGS) ×3 IMPLANT
DURAPREP 26ML APPLICATOR (WOUND CARE) ×3 IMPLANT
ELECT REM PT RETURN 9FT ADLT (ELECTROSURGICAL) ×3
ELECTRODE REM PT RTRN 9FT ADLT (ELECTROSURGICAL) ×1 IMPLANT
EVACUATOR 1/8 PVC DRAIN (DRAIN) IMPLANT
FACESHIELD WRAPAROUND (MASK) ×6 IMPLANT
FEM-TROCHLEA KNEE XLG 7.0X-4.0 (Orthopedic Implant) ×3 IMPLANT
GAUZE SPONGE 4X4 12PLY STRL (GAUZE/BANDAGES/DRESSINGS) ×3 IMPLANT
GAUZE XEROFORM 5X9 LF (GAUZE/BANDAGES/DRESSINGS) ×3 IMPLANT
GLOVE BIOGEL PI IND STRL 7.0 (GLOVE) ×1 IMPLANT
GLOVE BIOGEL PI INDICATOR 7.0 (GLOVE) ×2
GLOVE ECLIPSE 7.0 STRL STRAW (GLOVE) ×3 IMPLANT
GLOVE ORTHO TXT STRL SZ7.5 (GLOVE) ×6 IMPLANT
GOWN STRL REUS W/ TWL LRG LVL3 (GOWN DISPOSABLE) ×2 IMPLANT
GOWN STRL REUS W/ TWL XL LVL3 (GOWN DISPOSABLE) ×1 IMPLANT
GOWN STRL REUS W/TWL LRG LVL3 (GOWN DISPOSABLE) ×4
GOWN STRL REUS W/TWL XL LVL3 (GOWN DISPOSABLE) ×2
HANDPIECE INTERPULSE COAX TIP (DISPOSABLE) ×2
KIT BASIN OR (CUSTOM PROCEDURE TRAY) ×3 IMPLANT
KIT PIN (KITS) ×3 IMPLANT
KIT ROOM TURNOVER OR (KITS) ×3 IMPLANT
MANIFOLD NEPTUNE II (INSTRUMENTS) ×3 IMPLANT
NS IRRIG 1000ML POUR BTL (IV SOLUTION) ×3 IMPLANT
PACK TOTAL JOINT (CUSTOM PROCEDURE TRAY) ×3 IMPLANT
PACK UNIVERSAL I (CUSTOM PROCEDURE TRAY) IMPLANT
PAD ARMBOARD 7.5X6 YLW CONV (MISCELLANEOUS) ×6 IMPLANT
PADDING CAST ABS 6INX4YD NS (CAST SUPPLIES) ×4
PADDING CAST ABS COTTON 6X4 NS (CAST SUPPLIES) ×2 IMPLANT
PADDING CAST COTTON 6X4 STRL (CAST SUPPLIES) ×6 IMPLANT
PATELLA ASYMMETRIC 35X10MM (Knees) ×3 IMPLANT
POST TAPER 11MM (Orthopedic Implant) ×3 IMPLANT
RUBBERBAND STERILE (MISCELLANEOUS) ×3 IMPLANT
SET HNDPC FAN SPRY TIP SCT (DISPOSABLE) ×1 IMPLANT
STAPLER VISISTAT 35W (STAPLE) ×3 IMPLANT
SUCTION FRAZIER HANDLE 10FR (MISCELLANEOUS) ×2
SUCTION TUBE FRAZIER 10FR DISP (MISCELLANEOUS) ×1 IMPLANT
SUT VIC AB 0 CT1 27 (SUTURE) ×2
SUT VIC AB 0 CT1 27XBRD ANBCTR (SUTURE) ×1 IMPLANT
SUT VIC AB 1 CTX 36 (SUTURE) ×2
SUT VIC AB 1 CTX36XBRD ANBCTR (SUTURE) ×1 IMPLANT
SUT VIC AB 2-0 SH 27 (SUTURE)
SUT VIC AB 2-0 SH 27XBRD (SUTURE) IMPLANT
SYR 30ML LL (SYRINGE) ×3 IMPLANT
TIP HIGH FLOW IRRIGATION COAX (MISCELLANEOUS) ×3 IMPLANT
TOWEL OR 17X24 6PK STRL BLUE (TOWEL DISPOSABLE) ×3 IMPLANT
TOWEL OR 17X26 10 PK STRL BLUE (TOWEL DISPOSABLE) ×3 IMPLANT
TRAY FOLEY CATH 16FRSI W/METER (SET/KITS/TRAYS/PACK) ×3 IMPLANT
WATER STERILE IRR 1000ML POUR (IV SOLUTION) IMPLANT

## 2016-01-13 NOTE — H&P (View-Only) (Signed)
TOTAL KNEE ADMISSION H&P  Patient is being admitted for left partial knee arthroplasty.  Subjective:  Chief Complaint:left knee pain.  HPI: Marilyn Rivas, 75 y.o. female, has a history of pain and functional disability in the left knee due to arthritis and has failed non-surgical conservative treatments for greater than 12 weeks to includeNSAID's and/or analgesics and corticosteriod injections.  Onset of symptoms was gradual, starting >10 years ago with rapidlly worsening course since that time. The patient noted prior procedures on the knee to include  arthroscopy and menisectomy on the left knee(s).  Patient currently rates pain in the left knee(s) at 8 out of 10 with activity. Patient has night pain, worsening of pain with activity and weight bearing, pain that interferes with activities of daily living and crepitus.  Patient has evidence of subchondral sclerosis and joint space narrowing by imaging studies. There is no active infection.  Patient Active Problem List   Diagnosis Date Noted  . Abdominal pain, epigastric 07/07/2014  . Fatigue 04/08/2014  . Dyspnea on exertion 04/08/2014  . Hyperlipidemia 04/08/2014  . Abdominal pain, left lower quadrant 05/20/2013  . Nausea alone 03/06/2013  . LLQ pain 03/05/2013  . Hyperglycemia 10/29/2012  . Urge incontinence of urine 10/29/2012  . Osteoporosis 05/13/2012  . Basal cell cancer 12/17/2011  . Sinus bradycardia by electrocardiogram 11/15/2011  . Hypothyroidism 09/07/2011  . DJD (degenerative joint disease) 09/07/2011  . Atrophic vaginitis 09/07/2011  . Radiculopathy of lumbar region 09/07/2011  . GERD 09/08/2010  . DIVERTICULITIS, COLON 09/08/2010  . CONSTIPATION 10/09/2009  . PERSONAL HX COLONIC POLYPS 10/09/2009  . Mixed hyperlipidemia 04/10/2009   Past Medical History  Diagnosis Date  . Thyroid disease   . Hyperlipidemia   . Menopause   . Osteopenia   . Diverticulosis   . Diverticulitis   . Hx of adenomatous colonic  polyps   . Seasonal allergies   . Cataracts, both eyes   . GERD (gastroesophageal reflux disease)     HISTORY  . Arthritis   . Lichen sclerosus Q000111Q    Biopsy proven    Past Surgical History  Procedure Laterality Date  . Appendectomy  1977  . Right oophorectomy  1977  . Patella reconstruction Left 1994  . Cervical fusion  1999    C5-7  . Lumbar disc surgery  2004    L5  . Cataract surgery Bilateral 06/2012  . Dilatation & curettage/hysteroscopy with trueclear N/A 05/02/2013    Procedure: DILATATION & CURETTAGE/HYSTEROSCOPY WITH TRUECLEAR ;  Surgeon: Anastasio Auerbach, MD;  Location: [facility];  Service: Gynecology;  Laterality: N/A;  . Hysteroscopy    . Dilation and curettage of uterus    . Hardware removal Left 12/12/2013    Procedure:  LEFT PATELLA HARDWARE REMOVAL;  Surgeon: [name], MD;  Location: [facility];  Service: Orthopedics;  Laterality: Left;  . Knee arthroscopy Left 12/12/2013    Procedure: LEFT PATELLA ARTHROSCOPY KNEE WITH DEBRIDEMENT/SHAVING (CONDROPLASTY), LYSIS OF ADHESIONS;  Surgeon: [name], MD;  Location: [facility];  Service: Orthopedics;  Laterality: Left;  . Left heart catheterization with coronary angiogram N/A 04/22/2014    Procedure: LEFT HEART CATHETERIZATION WITH CORONARY ANGIOGRAM;  Surgeon: Sinclair Grooms, MD;  Location: [facility] CATH LAB;  Service: Cardiovascular;  Laterality: N/A;  . Eye lid surg       (Not in a hospital admission) Allergies  Allergen Reactions  . Aleve [Naproxen Sodium] Other (See Comments)    Severe  abdominal pain  . Aspirin Other (See Comments)    REACTION: severe abdominal pain and excessive salavation   . Atorvastatin Other (See Comments)    myalgia  . Crestor [Rosuvastatin] Other (See Comments)    Muscle aches  . Durezol [Difluprednate]     Eye redness    . Other     Paseo eye drops - causes eye redness  . Adhesive [Tape] Rash    Looks burned  . Clarithromycin Rash     Says allergic to "mycins"  . Penicillins Swelling and Rash    Hands swelling  . Zofran [Ondansetron Hcl] Other (See Comments)    Headache     Social History  Substance Use Topics  . Smoking status: Former Smoker    Quit date: 12/10/1983  . Smokeless tobacco: Never Used  . Alcohol Use: Yes     Comment: OCC    Family History  Problem Relation Age of Onset  . Allergies Mother   . Heart disease Father   . Colon cancer Neg Hx      Review of Systems  Constitutional: Negative.   HENT: Negative.   Eyes: Negative.   Respiratory: Negative.   Cardiovascular: Negative.   Gastrointestinal: Negative.   Genitourinary: Negative.   Musculoskeletal: Positive for joint pain.  Skin: Negative.   Neurological: Negative.   Endo/Heme/Allergies: Negative.   Psychiatric/Behavioral: Negative.     Objective:  Physical Exam  Constitutional: She is oriented to person, place, and time. She appears well-developed and well-nourished.  HENT:  Head: Normocephalic and atraumatic.  Eyes: EOM are normal. Pupils are equal, round, and reactive to light.  Neck: Normal range of motion. Neck supple.  Cardiovascular: Normal rate and regular rhythm.   Respiratory: Effort normal and breath sounds normal.  GI: Soft. Bowel sounds are normal.  Musculoskeletal:  Specifically, left knee profound patellofemoral crepitus.  Pain with compression.  Ligaments are stable.  Alignment is good.  I am really not getting any tenderness medially or laterally.    Neurological: She is alert and oriented to person, place, and time.  Skin: Skin is warm and dry.  Psychiatric: She has a normal mood and affect. Her behavior is normal. Judgment and thought content normal.    Vital signs in last 24 hours: @VSRANGES @  Labs:   Estimated body mass index is 32.05 kg/(m^2) as calculated from the following:   Height as of 02/20/15: 5' (1.524 m).   Weight as of 02/20/15: 74.447 kg (164 lb 2 oz).   Imaging Review Plain radiographs  demonstrate severe degenerative joint disease of the left knee(s). The overall alignment isneutral. The bone quality appears to be fair for age and reported activity level.  Assessment/Plan:  End stage arthritis, left knee   The patient history, physical examination, clinical judgment of the provider and imaging studies are consistent with end stage degenerative joint disease of the left knee(s) and total knee arthroplasty is deemed medically necessary. The treatment options including medical management, injection therapy arthroscopy and arthroplasty were discussed at length. The risks and benefits of total knee arthroplasty were presented and reviewed. The risks due to aseptic loosening, infection, stiffness, patella tracking problems, thromboembolic complications and other imponderables were discussed. The patient acknowledged the explanation, agreed to proceed with the plan and consent was signed. Patient is being admitted for inpatient treatment for surgery, pain control, PT, OT, prophylactic antibiotics, VTE prophylaxis, progressive ambulation and ADL's and discharge planning. The patient is planning to be discharged home with home health services

## 2016-01-13 NOTE — Anesthesia Postprocedure Evaluation (Signed)
Anesthesia Post Note  Patient: Marilyn Rivas  Procedure(s) Performed: Procedure(s) (LRB): LEFT UNICOMPARTMENTAL KNEE (Left)  Patient location during evaluation: PACU Anesthesia Type: General Level of consciousness: awake and alert Pain management: pain level controlled Vital Signs Assessment: post-procedure vital signs reviewed and stable Respiratory status: spontaneous breathing, nonlabored ventilation and respiratory function stable Cardiovascular status: blood pressure returned to baseline and stable Postop Assessment: no signs of nausea or vomiting Anesthetic complications: no    Last Vitals:  Filed Vitals:   01/13/16 1115 01/13/16 1130  BP: 147/88 141/84  Pulse: 81 87  Temp:  36.8 C  Resp: 10 19    Last Pain:  Filed Vitals:   01/13/16 1133  PainSc: 3                  Tiajuana Amass

## 2016-01-13 NOTE — Anesthesia Procedure Notes (Signed)
Procedure Name: LMA Insertion Date/Time: 01/13/2016 8:55 AM Performed by: Ignacia Bayley Pre-anesthesia Checklist: Patient identified, Emergency Drugs available, Suction available and Patient being monitored Patient Re-evaluated:Patient Re-evaluated prior to inductionOxygen Delivery Method: Circle system utilized Preoxygenation: Pre-oxygenation with 100% oxygen Intubation Type: IV induction Ventilation: Mask ventilation without difficulty LMA: LMA inserted LMA Size: 4.0 Tube size: 4.0 mm Number of attempts: 1 Placement Confirmation: positive ETCO2 and breath sounds checked- equal and bilateral Tube secured with: Tape

## 2016-01-13 NOTE — Discharge Instructions (Signed)
INSTRUCTIONS AFTER JOINT REPLACEMENT   o Remove items at home which could result in a fall. This includes throw rugs or furniture in walking pathways o ICE to the affected joint every three hours while awake for 30 minutes at a time, for at least the first 3-5 days, and then as needed for pain and swelling.  Continue to use ice for pain and swelling. You may notice swelling that will progress down to the foot and ankle.  This is normal after surgery.  Elevate your leg when you are not up walking on it.   o Continue to use the breathing machine you got in the hospital (incentive spirometer) which will help keep your temperature down.  It is common for your temperature to cycle up and down following surgery, especially at night when you are not up moving around and exerting yourself.  The breathing machine keeps your lungs expanded and your temperature down.   DIET:  As you were doing prior to hospitalization, we recommend a well-balanced diet.  DRESSING / WOUND CARE / SHOWERING  Keep dressing on until follow up appointment.  Do not get bandage wet.    ACTIVITY  o Increase activity slowly as tolerated, but follow the weight bearing instructions below.   o No driving for 6 weeks or until further direction given by your physician.  You cannot drive while taking narcotics.  o No lifting or carrying greater than 10 lbs. until further directed by your surgeon. o Avoid periods of inactivity such as sitting longer than an hour when not asleep. This helps prevent blood clots.  o You may return to work once you are authorized by your doctor.     WEIGHT BEARING   Weight bearing as tolerated with assist device (walker, cane, etc) as directed, use it as long as suggested by your surgeon or therapist, typically at least 4-6 weeks.   EXERCISES  Results after joint replacement surgery are often greatly improved when you follow the exercise, range of motion and muscle strengthening exercises prescribed  by your doctor. Safety measures are also important to protect the joint from further injury. Any time any of these exercises cause you to have increased pain or swelling, decrease what you are doing until you are comfortable again and then slowly increase them. If you have problems or questions, call your caregiver or physical therapist for advice.   Rehabilitation is important following a joint replacement. After just a few days of immobilization, the muscles of the leg can become weakened and shrink (atrophy).  These exercises are designed to build up the tone and strength of the thigh and leg muscles and to improve motion. Often times heat used for twenty to thirty minutes before working out will loosen up your tissues and help with improving the range of motion but do not use heat for the first two weeks following surgery (sometimes heat can increase post-operative swelling).   These exercises can be done on a training (exercise) mat, on the floor, on a table or on a bed. Use whatever works the best and is most comfortable for you.    Use music or television while you are exercising so that the exercises are a pleasant break in your day. This will make your life better with the exercises acting as a break in your routine that you can look forward to.   Perform all exercises about fifteen times, three times per day or as directed.  You should exercise both the operative  leg and the other leg as well.  Exercises include:    Quad Sets - Tighten up the muscle on the front of the thigh (Quad) and hold for 5-10 seconds.    Straight Leg Raises - With your knee straight (if you were given a brace, keep it on), lift the leg to 60 degrees, hold for 3 seconds, and slowly lower the leg.  Perform this exercise against resistance later as your leg gets stronger.   Leg Slides: Lying on your back, slowly slide your foot toward your buttocks, bending your knee up off the floor (only go as far as is comfortable).  Then slowly slide your foot back down until your leg is flat on the floor again.   Angel Wings: Lying on your back spread your legs to the side as far apart as you can without causing discomfort.   Hamstring Strength:  Lying on your back, push your heel against the floor with your leg straight by tightening up the muscles of your buttocks.  Repeat, but this time bend your knee to a comfortable angle, and push your heel against the floor.  You may put a pillow under the heel to make it more comfortable if necessary.   A rehabilitation program following joint replacement surgery can speed recovery and prevent re-injury in the future due to weakened muscles. Contact your doctor or a physical therapist for more information on knee rehabilitation.    CONSTIPATION  Constipation is defined medically as fewer than three stools per week and severe constipation as less than one stool per week.  Even if you have a regular bowel pattern at home, your normal regimen is likely to be disrupted due to multiple reasons following surgery.  Combination of anesthesia, postoperative narcotics, change in appetite and fluid intake all can affect your bowels.   YOU MUST use at least one of the following options; they are listed in order of increasing strength to get the job done.  They are all available over the counter, and you may need to use some, POSSIBLY even all of these options:    Drink plenty of fluids (prune juice may be helpful) and high fiber foods Colace 100 mg by mouth twice a day  Senokot for constipation as directed and as needed Dulcolax (bisacodyl), take with full glass of water  Miralax (polyethylene glycol) once or twice a day as needed.  If you have tried all these things and are unable to have a bowel movement in the first 3-4 days after surgery call either your surgeon or your primary doctor.    If you experience loose stools or diarrhea, hold the medications until you stool forms back up.  If  your symptoms do not get better within 1 week or if they get worse, check with your doctor.  If you experience "the worst abdominal pain ever" or develop nausea or vomiting, please contact the office immediately for further recommendations for treatment.   ITCHING:  If you experience itching with your medications, try taking only a single pain pill, or even half a pain pill at a time.  You can also use Benadryl over the counter for itching or also to help with sleep.   TED HOSE STOCKINGS:  Use stockings on both legs until for at least 2 weeks or as directed by physician office. They may be removed at night for sleeping.  MEDICATIONS:  See your medication summary on the After Visit Summary that nursing will review with you.  You may have some home medications which will be placed on hold until you complete the course of blood thinner medication.  It is important for you to complete the blood thinner medication as prescribed.  PRECAUTIONS:  If you experience chest pain or shortness of breath - call 911 immediately for transfer to the hospital emergency department.   If you develop a fever greater that 101 F, purulent drainage from wound, increased redness or drainage from wound, foul odor from the wound/dressing, or calf pain - CONTACT YOUR SURGEON.                                                   FOLLOW-UP APPOINTMENTS:  If you do not already have a post-op appointment, please call the office for an appointment to be seen by your surgeon.  Guidelines for how soon to be seen are listed in your After Visit Summary, but are typically between 1-4 weeks after surgery.  OTHER INSTRUCTIONS:   Knee Replacement:  Do not place pillow under knee, focus on keeping the knee straight while resting. CPM instructions: 0-90 degrees, 2 hours in the morning, 2 hours in the afternoon, and 2 hours in the evening. Place foam block, curve side up under heel at all times except when in CPM or when walking.  DO NOT  modify, tear, cut, or change the foam block in any way.  MAKE SURE YOU:   Understand these instructions.   Get help right away if you are not doing well or get worse.    Thank you for letting us be a part of your medical care team.  It is a privilege we respect greatly.  We hope these instructions will help you stay on track for a fast and full recovery!   Information on my medicine - ELIQUIS (apixaban)  This medication education was reviewed with me or my healthcare representative as part of my discharge preparation.  The pharmacist that spoke with me during my hospital stay was:  Duayne Cal, [facility]  Why was Eliquis prescribed for you? Eliquis was prescribed for you to reduce the risk of blood clots forming after orthopedic surgery.    What do You need to know about Eliquis? Take your Eliquis TWICE DAILY - one tablet in the morning and one tablet in the evening with or without food.  It would be best to take the dose about the same time each day.  If you have difficulty swallowing the tablet whole please discuss with your pharmacist how to take the medication safely.  Take Eliquis exactly as prescribed by your doctor and DO NOT stop taking Eliquis without talking to the doctor who prescribed the medication.  Stopping without other medication to take the place of Eliquis may increase your risk of developing a clot.  After discharge, you should have regular check-up appointments with your healthcare provider that is prescribing your Eliquis.  What do you do if you miss a dose? If a dose of ELIQUIS is not taken at the scheduled time, take it as soon as possible on the same day and twice-daily administration should be resumed.  The dose should not be doubled to make up for a missed dose.  Do not take more than one tablet of ELIQUIS at the same time.  Important Safety Information A possible side effect of Eliquis  is bleeding. You should call your healthcare provider right  away if you experience any of the following: ? Bleeding from an injury or your nose that does not stop. ? Unusual colored urine (red or dark brown) or unusual colored stools (red or black). ? Unusual bruising for unknown reasons. ? A serious fall or if you hit your head (even if there is no bleeding).  Some medicines may interact with Eliquis and might increase your risk of bleeding or clotting while on Eliquis. To help avoid this, consult your healthcare provider or pharmacist prior to using any new prescription or non-prescription medications, including herbals, vitamins, non-steroidal anti-inflammatory drugs (NSAIDs) and supplements.  This website has more information on Eliquis (apixaban): http://www.eliquis.com/eliquis/home

## 2016-01-13 NOTE — Transfer of Care (Signed)
Immediate Anesthesia Transfer of Care Note  Patient: Marilyn Rivas  Procedure(s) Performed: Procedure(s): LEFT UNICOMPARTMENTAL KNEE (Left)  Patient Location: PACU  Anesthesia Type:General  Level of Consciousness: sedated  Airway & Oxygen Therapy: Patient Spontanous Breathing and Patient connected to nasal cannula oxygen  Post-op Assessment: Report given to RN and Post -op Vital signs reviewed and stable  Post vital signs: stable  Last Vitals:  Filed Vitals:   01/13/16 0702  BP: 170/82  Pulse: 62  Temp: 36.7 C  Resp: 20    Last Pain: There were no vitals filed for this visit.    Patients Stated Pain Goal: 1 ([id number])  Complications: No apparent anesthesia complications

## 2016-01-13 NOTE — Anesthesia Preprocedure Evaluation (Addendum)
Anesthesia Evaluation  Patient identified by MRN, date of birth, ID band Patient awake    Reviewed: Allergy & Precautions, NPO status , Patient's Chart, lab work & pertinent test results  Airway Mallampati: II  TM Distance: >3 FB Neck ROM: Full    Dental   Pulmonary former smoker,    breath sounds clear to auscultation       Cardiovascular  Rhythm:Regular Rate:Normal  03/2014 Echo: Left ventricle: The cavity size was normal. Wall thickness was normal. Systolic function was normal. The estimated ejection fraction was in the range of 60% to 65%. Doppler parameters are consistent with abnormal left ventricular relaxation (grade 1 diastolic dysfunction). - Aortic valve: There was mild regurgitation. - Left atrium: The atrium was mildly dilated. 03/2014: Cath- Normal coronaries.   Neuro/Psych Anxiety Depression  Neuromuscular disease    GI/Hepatic Neg liver ROS, GERD  ,  Endo/Other  Hypothyroidism   Renal/GU negative Renal ROS     Musculoskeletal  (+) Arthritis ,   Abdominal   Peds  Hematology negative hematology ROS (+)   Anesthesia Other Findings   Reproductive/Obstetrics                            Lab Results  Component Value Date   WBC 6.7 01/01/2016   HGB 14.9 01/01/2016   HCT 44.3 01/01/2016   MCV 93.1 01/01/2016   PLT 266 01/01/2016   Lab Results  Component Value Date   CREATININE 0.62 01/01/2016   BUN 10 01/01/2016   NA 141 01/01/2016   K 4.3 01/01/2016   CL 105 01/01/2016   CO2 27 01/01/2016    Anesthesia Physical Anesthesia Plan  ASA: II  Anesthesia Plan: Spinal   Post-op Pain Management:    Induction: Intravenous  Airway Management Planned: Natural Airway and Simple Face Mask  Additional Equipment:   Intra-op Plan:   Post-operative Plan: Extubation in OR  Informed Consent: I have reviewed the patients History and Physical, chart, labs and discussed  the procedure including the risks, benefits and alternatives for the proposed anesthesia with the patient or authorized representative who has indicated his/her understanding and acceptance.   Dental advisory given  Plan Discussed with: CRNA  Anesthesia Plan Comments:        Anesthesia Quick Evaluation

## 2016-01-13 NOTE — Progress Notes (Signed)
Orthopedic Tech Progress Note Patient Details:  Marilyn Rivas [birth year] [id number] Ortho visit on cpm Patient ID: Marilyn Rivas, female   DOB: 01-08-1941, 75 y.o.   MRN: [id number]   Braulio Bosch 01/13/2016, 6:27 PM

## 2016-01-13 NOTE — Evaluation (Signed)
Physical Therapy Evaluation Patient Details Name: Marilyn Rivas MRN: [id number] DOB: 1941-04-19 Today's Date: 01/13/2016   History of Present Illness  Pt is a 75 y.o. female now s/p Lt unicompartmental knee replacement. PMH:  osteopenia, anxiety, depression, cervical fusion, lumbar surgery.  Clinical Impression  Pt is s/p TKA resulting in the deficits listed below (see PT Problem List).  Pt will benefit from skilled PT to increase their independence and safety with mobility to allow discharge to home with family support. Activity limited during initial session due to lethargy and pt reports of feeling lightheaded. Will progress as tolerated.      Follow Up Recommendations Home health PT;Supervision/Assistance - 24 hour    Equipment Recommendations  None recommended by PT    Recommendations for Other Services       Precautions / Restrictions Precautions Precautions: Knee;Fall Precaution Booklet Issued: Yes (comment) Precaution Comments: HEP provided and knee extension precautions reviewed.  Restrictions Weight Bearing Restrictions: Yes LLE Weight Bearing: Weight bearing as tolerated      Mobility  Bed Mobility Overal bed mobility: Needs Assistance Bed Mobility: Supine to Sit;Sit to Supine     Supine to sit: Min guard Sit to supine: Min guard   General bed mobility comments: guard for safety  Transfers Overall transfer level: Needs assistance Equipment used: Rolling walker (2 wheeled) Transfers: Sit to/from [employer] Sit to Stand: Min guard Stand pivot transfers: Min guard       General transfer comment: Pt transferring from bed to [facility]. Returning to bed due to reports of feeling dizzy and lightheaded. SpO2 89-91%.  Ambulation/Gait                Stairs            Wheelchair Mobility    Modified Rankin (Stroke Patients Only)       Balance Overall balance assessment: Needs assistance Sitting-balance support: No upper  extremity supported Sitting balance-Leahy Scale: Good     Standing balance support: Bilateral upper extremity supported Standing balance-Leahy Scale: Poor Standing balance comment: using rw for support                             Pertinent Vitals/Pain Pain Assessment: Faces Faces Pain Scale: Hurts a little bit Pain Location: Lt knee Pain Descriptors / Indicators: Aching Pain Intervention(s): Limited activity within patient's tolerance;Monitored during session;Ice applied    Home Living Family/patient expects to be discharged to:: Private residence Living Arrangements: Children Available Help at Discharge: Family;Available 24 hours/day Type of Home: House Home Access: Stairs to enter Entrance Stairs-Rails: None Entrance Stairs-Number of Steps: 2 Home Layout: One level Home Equipment: Walker - 2 wheels Additional Comments: Family will be staying with the patient upon D/C.     Prior Function Level of Independence: Independent               Hand Dominance        Extremity/Trunk Assessment   Upper Extremity Assessment: Overall WFL for tasks assessed           Lower Extremity Assessment: LLE deficits/detail   LLE Deficits / Details: able to perform SLR without assistance     Communication   Communication: No difficulties  Cognition Arousal/Alertness: Lethargic Behavior During Therapy: WFL for tasks assessed/performed Overall Cognitive Status: Within Functional Limits for tasks assessed  General Comments      Exercises        Assessment/Plan    PT Assessment Patient needs continued PT services  PT Diagnosis Difficulty walking   PT Problem List Decreased strength;Decreased range of motion;Decreased activity tolerance;Decreased balance;Decreased mobility  PT Treatment Interventions DME instruction;Gait training;Stair training;Functional mobility training;Therapeutic activities;Therapeutic exercise;Balance  training;Patient/family education   PT Goals (Current goals can be found in the Care Plan section) Acute Rehab PT Goals Patient Stated Goal: get back to being independent PT Goal Formulation: With patient/family Time For Goal Achievement: 01/27/16 Potential to Achieve Goals: Good    Frequency 7X/week   Barriers to discharge        Co-evaluation               End of Session Equipment Utilized During Treatment: Gait belt;Oxygen Activity Tolerance: Patient limited by lethargy;Other (comment) (reports of feeling lightheaded) Patient left: in bed;with call bell/phone within reach;with family/visitor present;with SCD's reapplied;Other (comment) (in bone foam) Nurse Communication: Mobility status;Weight bearing status    Functional Assessment Tool Used: clinical judgment Functional Limitation: Mobility: Walking and moving around Mobility: Walking and Moving Around Current Status [id number]): At least 40 percent but less than 60 percent impaired, limited or restricted Mobility: Walking and Moving Around Goal Status [phone]): At least 20 percent but less than 40 percent impaired, limited or restricted    Time: 1431-1504 PT Time Calculation (min) (ACUTE ONLY): 33 min   Charges:   PT Evaluation $PT Eval Moderate Complexity: 1 Procedure PT Treatments $Therapeutic Activity: 8-22 mins   PT G Codes:   PT G-Codes **NOT FOR INPATIENT CLASS** Functional Assessment Tool Used: clinical judgment Functional Limitation: Mobility: Walking and moving around Mobility: Walking and Moving Around Current Status [id number]): At least 40 percent but less than 60 percent impaired, limited or restricted Mobility: Walking and Moving Around Goal Status [phone]): At least 20 percent but less than 40 percent impaired, limited or restricted    Cassell Clement, PT, [place] Pager [phone] Office [phone]  01/13/2016, 3:20 PM

## 2016-01-13 NOTE — Discharge Summary (Addendum)
Patient ID: Marilyn Rivas MRN: [id number] DOB/AGE: September 01, 1940 75 y.o.  Admit date: 01/13/2016 Discharge date: 01/14/2016  Admission Diagnoses:  Active Problems:   Status post left partial knee replacement   Discharge Diagnoses:  Same  Past Medical History  Diagnosis Date  . Thyroid disease   . Hyperlipidemia   . Menopause   . Osteopenia   . Diverticulosis   . Diverticulitis   . Hx of adenomatous colonic polyps   . Seasonal allergies   . GERD (gastroesophageal reflux disease)     HISTORY  . Lichen sclerosus [id number]    Biopsy proven  . Hypothyroidism   . Anxiety   . Depression   . Cancer ([place])     skin pre-cancer  . Family history of adverse reaction to anesthesia     "daughter gets really nauseous & has trouble getting intubated" (01/13/2016)  . Pneumonia 1990s X 1; early 2000s X 1  . History of blood transfusion 1977    "related to OR"  . Arthritis     "left; maybe back" (01/13/2016)    Surgeries: Procedure(s): LEFT UNICOMPARTMENTAL KNEE on 01/13/2016   Consultants:    Discharged Condition: Improved  Hospital Course: Marilyn Rivas is an 75 y.o. female who was admitted 01/13/2016 for operative treatment of primary localized osteoarthritis patellofemoral compartment. Patient has severe unremitting pain that affects sleep, daily activities, and work/hobbies. After pre-op clearance the patient was taken to the operating room on 01/13/2016 and underwent  Procedure(s): LEFT UNICOMPARTMENTAL KNEE.    Patient was given perioperative antibiotics:      Anti-infectives    Start     Dose/Rate Route Frequency Ordered Stop   01/13/16 2100  vancomycin (VANCOCIN) IVPB 1000 mg/200 mL premix     1,000 mg 200 mL/hr over 60 Minutes Intravenous Every 12 hours 01/13/16 1145 01/13/16 2206   01/13/16 0709  vancomycin (VANCOCIN) 1-5 GM/200ML-% IVPB    Comments:  Precious Haws   : cabinet override      01/13/16 0709 01/13/16 1914       Patient was given sequential  compression devices, early ambulation, and chemoprophylaxis to prevent DVT.  Patient benefited maximally from hospital stay and there were no complications.    Recent vital signs:  Patient Vitals for the past 24 hrs:  BP Temp Temp src Pulse Resp SpO2 Height  01/14/16 0445 118/62 mmHg 98.9 F (37.2 C) Oral 66 15 - -  01/14/16 0031 113/64 mmHg 98.9 F (37.2 C) Oral 82 15 95 % -  01/14/16 0025 113/64 mmHg 98.9 F (37.2 C) Oral 82 15 95 % -  01/13/16 2202 (!) 105/54 mmHg 98.8 F (37.1 C) Oral 82 15 92 % -  01/13/16 1700 - - - - - - 5\' 1"  (1.549 m)  01/13/16 1313 (!) 141/88 mmHg 97.7 F (36.5 C) Oral 84 18 98 % -  01/13/16 1130 (!) 141/84 mmHg 98.2 F (36.8 C) - 87 19 98 % -  01/13/16 1115 (!) 147/88 mmHg - - 81 10 96 % -  01/13/16 1100 (!) 152/87 mmHg - - 80 10 99 % -  01/13/16 1045 139/79 mmHg - - 83 10 96 % -  01/13/16 1031 127/72 mmHg 98.4 F (36.9 C) - 78 10 94 % -     Recent laboratory studies: No results for input(s): WBC, HGB, HCT, PLT, NA, K, CL, CO2, BUN, CREATININE, GLUCOSE, INR, CALCIUM in the last 72 hours.  Invalid input(s): PT, 2   Discharge Medications:  Medication List    STOP taking these medications        acetaminophen 325 MG tablet  Commonly known as:  TYLENOL      TAKE these medications        apixaban 2.5 MG Tabs tablet  Commonly known as:  ELIQUIS  Take 1 tab po q12 hours x 21 days following surgery to prevent blood clots     CALCIUM CITRATE + PO  Take 2 tablets by mouth 2 (two) times daily. Contain 800mg  calcium citrate and other vitamins D,C, K, Mg, zinc, copper     clobetasol cream 0.05 %  Commonly known as:  TEMOVATE  Apply 1 application topically 2 (two) times daily.     diazepam 2 MG tablet  Commonly known as:  VALIUM  Take 1 tablet (2 mg total) by mouth every 8 (eight) hours as needed for muscle spasms.     hyoscyamine 0.125 MG SL tablet  Commonly known as:  LEVSIN/SL  Place 1 tablet (0.125 mg total) under the tongue every 4  (four) hours as needed.     levothyroxine 112 MCG tablet  Commonly known as:  SYNTHROID, LEVOTHROID  Take 1 tablet (112 mcg total) by mouth daily. USES NAME BRAND ONLY     omeprazole 20 MG tablet  Commonly known as:  PRILOSEC OTC  Take 20 mg by mouth daily as needed.     OVER THE COUNTER MEDICATION  Take 2 capsules by mouth 2 (two) times daily. BIOTEARS     oxyCODONE-acetaminophen 5-325 MG tablet  Commonly known as:  ROXICET  Take 1-2 tablets by mouth every 4 (four) hours as needed.     PROBIOTIC PO  Take 1-2 capsules by mouth as needed (for GI support).     Vitamin D3 5000 units Tabs  Take 1 tablet by mouth daily.        Diagnostic Studies: Dg Knee Left Port  01/13/2016  CLINICAL DATA:  Postop left partial knee replacement EXAM: PORTABLE LEFT KNEE - 1-2 VIEW COMPARISON:  None. FINDINGS: Two views of the left knee submitted. There is partial left knee prosthesis in distal left femur lateral compartment. There is anatomic alignment. IMPRESSION: Left knee hemi prosthesis in distal left femur laterally. There is anatomic alignment. Postsurgical changes are noted with small amount of periarticular soft tissue air. Electronically Signed   By: Lahoma Crocker M.D.   On: 01/13/2016 12:29    Disposition: 01-Home or Self Care    Follow-up Information    Follow up with [name], MD. Schedule an appointment as soon as possible for a visit in 1 week.   Specialty:  Orthopedic Surgery   Contact information:   [address] [place] [address] [phone]        Signed: Fannie Knee 01/14/2016, 7:05 AM

## 2016-01-13 NOTE — Progress Notes (Signed)
Orthopedic Tech Progress Note Patient Details:  Marilyn Rivas 1941/04/18 [id number] Viewed order from doctor's order list CPM Left Knee CPM Left Knee: On Left Knee Flexion (Degrees): 90 Left Knee Extension (Degrees): 0 Additional Comments: trapeze bar patient helper   Hildred Priest 01/13/2016, 10:56 AM

## 2016-01-13 NOTE — Interval H&P Note (Signed)
History and Physical Interval Note:  01/13/2016 8:33 AM  Marilyn Rivas  has presented today for surgery, with the diagnosis of djd left knee  The various methods of treatment have been discussed with the patient and family. After consideration of risks, benefits and other options for treatment, the patient has consented to  Procedure(s): LEFT UNICOMPARTMENTAL KNEE (Left) as a surgical intervention .  The patient's history has been reviewed, patient examined, no change in status, stable for surgery.  I have reviewed the patient's chart and labs.  Questions were answered to the patient's satisfaction.     [name]

## 2016-01-14 LAB — BASIC METABOLIC PANEL
Anion gap: 11 (ref 5–15)
BUN: 10 mg/dL (ref 6–20)
CHLORIDE: 102 mmol/L (ref 101–111)
CO2: 24 mmol/L (ref 22–32)
CREATININE: 0.74 mg/dL (ref 0.44–1.00)
Calcium: 9.2 mg/dL (ref 8.9–10.3)
GFR calc non Af Amer: >60 mL/min (ref >60)
Glucose, Bld: 130 mg/dL — ABNORMAL HIGH (ref 65–99)
POTASSIUM: 4.4 mmol/L (ref 3.5–5.1)
Sodium: 137 mmol/L (ref 135–145)

## 2016-01-14 LAB — CBC
HEMATOCRIT: 37.1 % (ref 36.0–46.0)
HEMOGLOBIN: 12.7 g/dL (ref 12.0–15.0)
MCH: 32.1 pg (ref 26.0–34.0)
MCHC: 34.2 g/dL (ref 30.0–36.0)
MCV: 93.7 fL (ref 78.0–100.0)
Platelets: 250 10*3/uL (ref 150–400)
RBC: 3.96 MIL/uL (ref 3.87–5.11)
RDW: 12.4 % (ref 11.5–15.5)
WBC: 11.1 10*3/uL — ABNORMAL HIGH (ref 4.0–10.5)

## 2016-01-14 NOTE — Progress Notes (Signed)
Physical Therapy Treatment Patient Details Name: Marilyn Rivas MRN: [id number] DOB: [birth year] Today's Date: 01/14/2016    History of Present Illness Pt is a 75 y.o. female now s/p Lt unicompartmental knee replacement. PMH:  osteopenia, anxiety, depression, cervical fusion, lumbar surgery.    PT Comments    Pt performed increased mobility, review of HEP and successful completion of stair training in prep for d/c home.  RN informed pt is ready.  Will continue therapy if patient remains this pm.  Pt does not require pm session for d/c home.    Follow Up Recommendations  Home health PT;Supervision/Assistance - 24 hour     Equipment Recommendations  None recommended by PT    Recommendations for Other Services       Precautions / Restrictions Precautions Precautions: Knee;Fall Precaution Booklet Issued: Yes (comment) Precaution Comments: HEP provided and knee extension precautions reviewed.  Restrictions Weight Bearing Restrictions: Yes LLE Weight Bearing: Weight bearing as tolerated    Mobility  Bed Mobility Overal bed mobility: Needs Assistance Bed Mobility: Supine to Sit     Supine to sit: Supervision (HOB down and no rail) Sit to supine: Supervision   General bed mobility comments: Required increased time self assisted L leg.    Transfers Overall transfer level: Needs assistance Equipment used: Rolling walker (2 wheeled) Transfers: Sit to/from Stand Sit to Stand: Supervision Stand pivot transfers: Supervision       General transfer comment: Cues for hand placement to transfer to and from seated surface.    Ambulation/Gait Ambulation/Gait assistance: Supervision Ambulation Distance (Feet): 90 Feet Assistive device: Rolling walker (2 wheeled) Gait Pattern/deviations: Step-through pattern;Antalgic;Decreased stride length     General Gait Details: Cues for L heel strike and knee extension in stance phase and L knee flexion in swing phase.      Stairs Stairs: Yes Stairs assistance: Min guard Stair Management: One rail Left;No rails Number of Stairs: 4 (x2 with L rails and x2 with no rails and use of RW.  ) General stair comments: Cues for sequencing and hand placement on rail to negotiate 2 stairs with L rail.  Cues for sequencing and RW placement to ascend two stairs with RW.  Pt tolerated both techniques well.  Handout issued on stair training with RW.    Wheelchair Mobility    Modified Rankin (Stroke Patients Only)       Balance Overall balance assessment: Needs assistance Sitting-balance support: No upper extremity supported;Feet supported Sitting balance-Leahy Scale: Good     Standing balance support: Bilateral upper extremity supported;During functional activity Standing balance-Leahy Scale: Fair Standing balance comment: reliant on RW                    Cognition Arousal/Alertness: Awake/alert Behavior During Therapy: WFL for tasks assessed/performed Overall Cognitive Status: Within Functional Limits for tasks assessed                      Exercises Total Joint Exercises Ankle Circles/Pumps: AROM;Both;10 reps;Supine Quad Sets: AROM;Both;10 reps;Supine Towel Squeeze: AROM;Both;10 reps;Supine Short Arc Quad: AROM;Left;10 reps;Supine Heel Slides: AAROM;Left;10 reps;Supine Hip ABduction/ADduction: AROM;Left;10 reps;Supine Straight Leg Raises: AROM;Left;10 reps;Supine Goniometric ROM: 3-62 Required AAROM for knee flexion, pt actively only able to achieve 51 degrees flexion.      General Comments        Pertinent Vitals/Pain Pain Assessment: 0-10 Pain Score: 7  Pain Location: L knee bending.   Pain Descriptors / Indicators: Aching;Sore Pain Intervention(s): Monitored during session;Repositioned  Home Living   Living Arrangements: Children Available Help at Discharge: Family;Available 24 hours/day;Friend(s) Type of Home: House Home Access: Stairs to enter Entrance  Stairs-Rails: None Home Layout: One level Home Equipment: Environmental consultant - 2 wheels;Toilet riser;Hand held shower head;Grab bars - toilet Additional Comments: Family will be staying with the patient upon D/C.     Prior Function Level of Independence: Independent          PT Goals (current goals can now be found in the care plan section) Acute Rehab PT Goals Patient Stated Goal: home today Potential to Achieve Goals: Good Progress towards PT goals: Progressing toward goals    Frequency  7X/week    PT Plan      Co-evaluation             End of Session Equipment Utilized During Treatment: Gait belt Activity Tolerance: Patient tolerated treatment well Patient left: in bed;with call bell/phone within reach;with family/visitor present;with SCD's reapplied;Other (comment)     Time: 1025-1110 PT Time Calculation (min) (ACUTE ONLY): 45 min  Charges:  $Gait Training: 23-37 mins $Therapeutic Activity: 8-22 mins                    G Codes:      Cristela Blue 02/08/2016, 11:20 AM  Governor Rooks, PTA pager [phone]

## 2016-01-14 NOTE — Care Management Note (Signed)
Case Management Note  Patient Details  Name: Marilyn Rivas MRN: [id number] Date of Birth: Oct 03, 1940  Subjective/Objective:    75 yr old female s/p left unicompartmental knee arthroplasty.         Action/Plan:  Case manager spoke with patient and her daughter at the bedisude concerning home health and DME needs. Choice was offered for home health agencies. CM was given permission to arrange [facility]. Referral was called to Rhett Bannister, [facility] Liaison. CM ordered RW and 3in1. Patient will have family assistance at discharge.   Expected Discharge Date:   01/14/16               Expected Discharge Plan:  [place]  In-House Referral:     Discharge planning Services  CM Consult, [place]  Post Acute Care Choice:  Durable Medical Equipment, Home Health Choice offered to:  Patient  DME Arranged:  3-N-1, Walker rolling DME Agency:  [place]:  PT [facility] Agency:  [facility]  Status of Service:  Completed, signed off  Medicare Important Message Given:    Date Medicare IM Given:    Medicare IM give by:    Date Additional Medicare IM Given:    Additional Medicare Important Message give by:     If discussed at [place] of Stay Meetings, dates discussed:    Additional Comments:  Ninfa Meeker, RN 01/14/2016, 10:58 AM

## 2016-01-14 NOTE — Op Note (Signed)
NAMEMarland Kitchen  Marilyn Rivas, Marilyn Rivas NO.:  [id number]  MEDICAL RECORD NO.:  [id number]  LOCATION:  [id number]                        FACILITY:  [facility]  PHYSICIAN:  [name], M.D. DATE OF BIRTH:  06/06/1941  DATE OF PROCEDURE:  01/13/2016 DATE OF DISCHARGE:                              OPERATIVE REPORT   PREOPERATIVE DIAGNOSIS:  Left knee end-stage arthritis, patellofemoral joint, posttraumatic.  POSTOPERATIVE DIAGNOSIS:  Left knee end-stage arthritis, patellofemoral joint, posttraumatic.  PROCEDURE:  Left knee unicompartmental knee replacement utilizing a Arthrosurface Stryker prosthesis.  A press-fit 4 x 7 trochlear component.  A cemented 35 mm pegged medial offset __________ cemented patellar component.  Lateral retinacular release.  SURGEON:  [name], M.D.  ASSISTANT:  Elmyra Ricks, P.A., who present throughout the entire case and necessary for timely completion of procedure.  ANESTHESIA:  General.  BLOOD LOSS:  Minimal.  SPECIMENS:  None.  CULTURES:  None.  COMPLICATIONS:  None.  DRESSING:  Soft compressive knee immobilizer.  TOURNIQUET TIME:  45 minutes.  PROCEDURE IN DETAIL:  The patient was brought to the operating room, placed on the operating table in supine position.  After adequate anesthesia had been obtained, tourniquet applied.  Prepped and draped in usual sterile fashion.  Exsanguinated with elevation of Esmarch. Tourniquet inflated to 350 mmHg.  A straight incision above the patella down the tibial tubercle using her previous incision.  Medial arthrotomy vastus splitting preserving quad tendon.  Knee exposed.  Medial meniscus, medial compartment, lateral meniscus, lateral compartment, cruciate ligaments all looked great.  Extensive erosive grade 4 changes, patellofemoral joint.  Utilizing Arthrosurface instrumentation, the trochlea was measured, prepared, reamed, and then fitted with a press- fit 4 x 7 component.  Good  fixation and alignment as well as interface of surrounding tissue.  Patella exposed __________ off the back as it was already very thin from previous trauma.  Drilled, sized, and fitted for a #35 component which was sized and cemented.  At completion, I was pleased with alignment and motion, but there was still tethering laterally.  Once I completed lateral release, excellent patellar tracking.  Wound irrigated.  Injected Exparel.  Arthrotomy closed with Vicryl.  Subcutaneous and subcuticular closure.  Sterile compressive dressing applied.  Tourniquet deflated and removed.  Knee immobilizer applied.  Anesthesia reversed.  Brought to the recovery room.  Tolerated the surgery well.  No complications.     [name], M.D.     DFM/MEDQ  D:  01/13/2016  T:  01/14/2016  Job:  [id number]

## 2016-01-14 NOTE — Progress Notes (Signed)
Discharge instructions RX's and follow up appts explained provided to family, verbalized understanding. Home health set up for patient 3in1 and rolling walker sent home with patient. Patient discharged home with family left floor via wheelchair accompanied by volunteers no c/o pain or shortness of breath at d/c. Surgical clean dry and intact.  Vennie Waymire, Tivis Ringer, RN

## 2016-01-14 NOTE — Evaluation (Signed)
Occupational Therapy Evaluation and Discharge Patient Details Name: Marilyn Rivas MRN: [id number] DOB: [birth year] Today's Date: 01/14/2016    History of Present Illness Pt is a 75 y.o. female now s/p Lt unicompartmental knee replacement. PMH:  osteopenia, anxiety, depression, cervical fusion, lumbar surgery.   Clinical Impression   This 75 yo female admitted and underwent above presents to acute OT with all education completed, we will D/C from acute OT.    Follow Up Recommendations  No OT follow up    Equipment Recommendations  None recommended by OT       Precautions / Restrictions Precautions Precautions: Knee;Fall Restrictions Weight Bearing Restrictions: Yes LLE Weight Bearing: Weight bearing as tolerated      Mobility Bed Mobility Overal bed mobility: Needs Assistance Bed Mobility: Supine to Sit     Supine to sit: Supervision (HOB down and no rail)        Transfers Overall transfer level: Needs assistance Equipment used: Rolling walker (2 wheeled) Transfers: Sit to/from Stand Sit to Stand: Min guard              Balance Overall balance assessment: Needs assistance Sitting-balance support: No upper extremity supported;Feet supported Sitting balance-Leahy Scale: Good     Standing balance support: Bilateral upper extremity supported;During functional activity Standing balance-Leahy Scale: Poor Standing balance comment: reliant on RW                            ADL Overall ADL's : Needs assistance/impaired Eating/Feeding: Independent;Sitting   Grooming: Min guard;Wash/dry hands;Standing   Upper Body Bathing: Set up;Sitting   Lower Body Bathing: Minimal assistance (min guard A sit<>stand)   Upper Body Dressing : Set up;Sitting   Lower Body Dressing: Minimal assistance (min guard A sit<>stand) Lower Body Dressing Details (indicate cue type and reason):  (educated pt on use of sock aid with her return demonstrating and made her  aware of where she can get one) Toilet Transfer: Min guard;Ambulation;RW;Comfort height toilet;Grab bars   Toileting- Clothing Manipulation and Hygiene: Min guard;Sit to/from Nurse, children's Details (indicate cue type and reason): Pt reports she will sponge bath until she can get in and out of shower stall safely and stand to shower Functional mobility during ADLs: Min guard;Rolling walker General ADL Comments: encouraged pt to use inspirometer 10 times every waking hour               Pertinent Vitals/Pain Pain Assessment: 0-10 Pain Score: 5  Pain Location: left knee--bending Pain Descriptors / Indicators: Aching;Sore Pain Intervention(s): Monitored during session;Repositioned;Patient requesting pain meds-RN notified     Hand Dominance Right   Extremity/Trunk Assessment Upper Extremity Assessment Upper Extremity Assessment: Overall WFL for tasks assessed           Communication Communication Communication: No difficulties   Cognition Arousal/Alertness: Awake/alert Behavior During Therapy: WFL for tasks assessed/performed Overall Cognitive Status: Within Functional Limits for tasks assessed                                Home Living   Living Arrangements: Children Available Help at Discharge: Family;Available 24 hours/day;Friend(s) Type of Home: House Home Access: Stairs to enter [employer] of Steps: 2 Entrance Stairs-Rails: None Home Layout: One level               Home Equipment: Walker - 2 wheels;Toilet riser;Hand held shower  head;Grab bars - toilet   Additional Comments: Family will be staying with the patient upon D/C.       Prior Functioning/Environment Level of Independence: Independent             OT Diagnosis: Generalized weakness;Acute pain         OT Goals(Current goals can be found in the care plan section) Acute Rehab OT Goals Patient Stated Goal: home today  OT Frequency:                 End of Session Equipment Utilized During Treatment: Gait belt;Rolling walker CPM Left Knee CPM Left Knee: On Left Knee Flexion (Degrees): 65 Left Knee Extension (Degrees): 0 Nurse Communication: Patient requests pain meds  Activity Tolerance: Patient tolerated treatment well Patient left: in chair;with call bell/phone within reach   Time: 0822-0850 OT Time Calculation (min): 28 min Charges:  OT General Charges $OT Visit: 1 Procedure OT Evaluation $OT Eval Moderate Complexity: 1 Procedure OT Treatments $Self Care/Home Management : 8-22 mins  [name] [id number] 01/14/2016, 9:04 AM

## 2016-01-14 NOTE — Progress Notes (Signed)
Subjective: 1 Day Post-Op Procedure(s) (LRB): LEFT UNICOMPARTMENTAL Rivas (Left) Patient reports pain as mild.  Patient reports lightheadedness following administration of pain meds.  Thinks this is due to taking on empty stomach.  We will see how she does today after eating breakfast.  Otherwise, no nausea/vomiting, chest pain/sob.    Objective: Vital signs in last 24 hours: Temp:  [97.7 F (36.5 C)-98.9 F (37.2 C)] 98.9 F (37.2 C) (05/18 0445) Pulse Rate:  [66-87] 66 (05/18 0445) Resp:  [10-19] 15 (05/18 0445) BP: (105-152)/(54-88) 118/62 mmHg (05/18 0445) SpO2:  [92 %-99 %] 95 % (05/18 0031)  Intake/Output from previous day: 05/17 0701 - 05/18 0700 In: 535 [P.O.:240; I.V.:295] Out: 175 [Urine:100; Blood:75] Intake/Output this shift:    No results for input(s): HGB in the last 72 hours. No results for input(s): WBC, RBC, HCT, PLT in the last 72 hours. No results for input(s): NA, K, CL, CO2, BUN, CREATININE, GLUCOSE, CALCIUM in the last 72 hours. No results for input(s): LABPT, INR in the last 72 hours.  Neurologically intact Neurovascular intact Sensation intact distally Intact pulses distally Dorsiflexion/Plantar flexion intact Compartment soft  Assessment/Plan: 1 Day Post-Op Procedure(s) (LRB): LEFT UNICOMPARTMENTAL Rivas (Left) Advance diet Up with therapy D/C IV fluids Discharge home with home health likely today following second session of PT WBAT LLE Dry dressing change prn  Marilyn Rivas 01/14/2016, 7:03 AM

## 2016-01-14 NOTE — Progress Notes (Signed)
Orthopedic Tech Progress Note Patient Details:  ERSEL TAHIR 1940/11/20 [id number]  Patient ID: [name], female   DOB: 04/08/1941, 75 y.o.   MRN: [id number] Applied cpm 0-65  Karolee Stamps 01/14/2016, 5:34 AM

## 2016-01-15 ENCOUNTER — Encounter (HOSPITAL_COMMUNITY): Payer: Self-pay | Admitting: Orthopedic Surgery

## 2016-01-15 DIAGNOSIS — E782 Mixed hyperlipidemia: Secondary | ICD-10-CM | POA: Diagnosis not present

## 2016-01-15 DIAGNOSIS — Z96652 Presence of left artificial knee joint: Secondary | ICD-10-CM | POA: Diagnosis not present

## 2016-01-15 DIAGNOSIS — E039 Hypothyroidism, unspecified: Secondary | ICD-10-CM | POA: Diagnosis not present

## 2016-01-15 DIAGNOSIS — Z9181 History of falling: Secondary | ICD-10-CM | POA: Diagnosis not present

## 2016-01-15 DIAGNOSIS — F329 Major depressive disorder, single episode, unspecified: Secondary | ICD-10-CM | POA: Diagnosis not present

## 2016-01-15 DIAGNOSIS — K5790 Diverticulosis of intestine, part unspecified, without perforation or abscess without bleeding: Secondary | ICD-10-CM | POA: Diagnosis not present

## 2016-01-15 DIAGNOSIS — Z471 Aftercare following joint replacement surgery: Secondary | ICD-10-CM | POA: Diagnosis not present

## 2016-01-15 DIAGNOSIS — M1991 Primary osteoarthritis, unspecified site: Secondary | ICD-10-CM | POA: Diagnosis not present

## 2016-01-15 DIAGNOSIS — M810 Age-related osteoporosis without current pathological fracture: Secondary | ICD-10-CM | POA: Diagnosis not present

## 2016-01-15 DIAGNOSIS — Z7901 Long term (current) use of anticoagulants: Secondary | ICD-10-CM | POA: Diagnosis not present

## 2016-01-15 DIAGNOSIS — F419 Anxiety disorder, unspecified: Secondary | ICD-10-CM | POA: Diagnosis not present

## 2016-01-15 DIAGNOSIS — Z8701 Personal history of pneumonia (recurrent): Secondary | ICD-10-CM | POA: Diagnosis not present

## 2016-01-18 DIAGNOSIS — K5790 Diverticulosis of intestine, part unspecified, without perforation or abscess without bleeding: Secondary | ICD-10-CM | POA: Diagnosis not present

## 2016-01-18 DIAGNOSIS — M810 Age-related osteoporosis without current pathological fracture: Secondary | ICD-10-CM | POA: Diagnosis not present

## 2016-01-18 DIAGNOSIS — F329 Major depressive disorder, single episode, unspecified: Secondary | ICD-10-CM | POA: Diagnosis not present

## 2016-01-18 DIAGNOSIS — F419 Anxiety disorder, unspecified: Secondary | ICD-10-CM | POA: Diagnosis not present

## 2016-01-18 DIAGNOSIS — Z471 Aftercare following joint replacement surgery: Secondary | ICD-10-CM | POA: Diagnosis not present

## 2016-01-18 DIAGNOSIS — M1991 Primary osteoarthritis, unspecified site: Secondary | ICD-10-CM | POA: Diagnosis not present

## 2016-01-19 ENCOUNTER — Encounter: Payer: Self-pay | Admitting: Internal Medicine

## 2016-01-19 DIAGNOSIS — M1712 Unilateral primary osteoarthritis, left knee: Secondary | ICD-10-CM | POA: Diagnosis not present

## 2016-01-20 DIAGNOSIS — F419 Anxiety disorder, unspecified: Secondary | ICD-10-CM | POA: Diagnosis not present

## 2016-01-20 DIAGNOSIS — Z471 Aftercare following joint replacement surgery: Secondary | ICD-10-CM | POA: Diagnosis not present

## 2016-01-20 DIAGNOSIS — M1991 Primary osteoarthritis, unspecified site: Secondary | ICD-10-CM | POA: Diagnosis not present

## 2016-01-20 DIAGNOSIS — M810 Age-related osteoporosis without current pathological fracture: Secondary | ICD-10-CM | POA: Diagnosis not present

## 2016-01-20 DIAGNOSIS — K5790 Diverticulosis of intestine, part unspecified, without perforation or abscess without bleeding: Secondary | ICD-10-CM | POA: Diagnosis not present

## 2016-01-20 DIAGNOSIS — F329 Major depressive disorder, single episode, unspecified: Secondary | ICD-10-CM | POA: Diagnosis not present

## 2016-01-22 DIAGNOSIS — Z471 Aftercare following joint replacement surgery: Secondary | ICD-10-CM | POA: Diagnosis not present

## 2016-01-22 DIAGNOSIS — M810 Age-related osteoporosis without current pathological fracture: Secondary | ICD-10-CM | POA: Diagnosis not present

## 2016-01-22 DIAGNOSIS — K5790 Diverticulosis of intestine, part unspecified, without perforation or abscess without bleeding: Secondary | ICD-10-CM | POA: Diagnosis not present

## 2016-01-22 DIAGNOSIS — F419 Anxiety disorder, unspecified: Secondary | ICD-10-CM | POA: Diagnosis not present

## 2016-01-22 DIAGNOSIS — M1991 Primary osteoarthritis, unspecified site: Secondary | ICD-10-CM | POA: Diagnosis not present

## 2016-01-22 DIAGNOSIS — F329 Major depressive disorder, single episode, unspecified: Secondary | ICD-10-CM | POA: Diagnosis not present

## 2016-01-26 DIAGNOSIS — F419 Anxiety disorder, unspecified: Secondary | ICD-10-CM | POA: Diagnosis not present

## 2016-01-26 DIAGNOSIS — Z471 Aftercare following joint replacement surgery: Secondary | ICD-10-CM | POA: Diagnosis not present

## 2016-01-26 DIAGNOSIS — M810 Age-related osteoporosis without current pathological fracture: Secondary | ICD-10-CM | POA: Diagnosis not present

## 2016-01-26 DIAGNOSIS — F329 Major depressive disorder, single episode, unspecified: Secondary | ICD-10-CM | POA: Diagnosis not present

## 2016-01-26 DIAGNOSIS — K5790 Diverticulosis of intestine, part unspecified, without perforation or abscess without bleeding: Secondary | ICD-10-CM | POA: Diagnosis not present

## 2016-01-26 DIAGNOSIS — M1991 Primary osteoarthritis, unspecified site: Secondary | ICD-10-CM | POA: Diagnosis not present

## 2016-01-27 DIAGNOSIS — M25662 Stiffness of left knee, not elsewhere classified: Secondary | ICD-10-CM | POA: Diagnosis not present

## 2016-01-27 DIAGNOSIS — R262 Difficulty in walking, not elsewhere classified: Secondary | ICD-10-CM | POA: Diagnosis not present

## 2016-01-27 DIAGNOSIS — M25562 Pain in left knee: Secondary | ICD-10-CM | POA: Diagnosis not present

## 2016-01-27 DIAGNOSIS — M1712 Unilateral primary osteoarthritis, left knee: Secondary | ICD-10-CM | POA: Diagnosis not present

## 2016-01-29 DIAGNOSIS — M25562 Pain in left knee: Secondary | ICD-10-CM | POA: Diagnosis not present

## 2016-01-29 DIAGNOSIS — M25662 Stiffness of left knee, not elsewhere classified: Secondary | ICD-10-CM | POA: Diagnosis not present

## 2016-01-29 DIAGNOSIS — R262 Difficulty in walking, not elsewhere classified: Secondary | ICD-10-CM | POA: Diagnosis not present

## 2016-01-29 DIAGNOSIS — M1712 Unilateral primary osteoarthritis, left knee: Secondary | ICD-10-CM | POA: Diagnosis not present

## 2016-02-01 DIAGNOSIS — M25562 Pain in left knee: Secondary | ICD-10-CM | POA: Diagnosis not present

## 2016-02-01 DIAGNOSIS — M25662 Stiffness of left knee, not elsewhere classified: Secondary | ICD-10-CM | POA: Diagnosis not present

## 2016-02-01 DIAGNOSIS — M1712 Unilateral primary osteoarthritis, left knee: Secondary | ICD-10-CM | POA: Diagnosis not present

## 2016-02-01 DIAGNOSIS — R262 Difficulty in walking, not elsewhere classified: Secondary | ICD-10-CM | POA: Diagnosis not present

## 2016-02-03 DIAGNOSIS — M1712 Unilateral primary osteoarthritis, left knee: Secondary | ICD-10-CM | POA: Diagnosis not present

## 2016-02-03 DIAGNOSIS — M25662 Stiffness of left knee, not elsewhere classified: Secondary | ICD-10-CM | POA: Diagnosis not present

## 2016-02-03 DIAGNOSIS — R262 Difficulty in walking, not elsewhere classified: Secondary | ICD-10-CM | POA: Diagnosis not present

## 2016-02-03 DIAGNOSIS — M25562 Pain in left knee: Secondary | ICD-10-CM | POA: Diagnosis not present

## 2016-02-05 DIAGNOSIS — R262 Difficulty in walking, not elsewhere classified: Secondary | ICD-10-CM | POA: Diagnosis not present

## 2016-02-05 DIAGNOSIS — M25662 Stiffness of left knee, not elsewhere classified: Secondary | ICD-10-CM | POA: Diagnosis not present

## 2016-02-05 DIAGNOSIS — M25562 Pain in left knee: Secondary | ICD-10-CM | POA: Diagnosis not present

## 2016-02-05 DIAGNOSIS — M1712 Unilateral primary osteoarthritis, left knee: Secondary | ICD-10-CM | POA: Diagnosis not present

## 2016-02-08 DIAGNOSIS — M1712 Unilateral primary osteoarthritis, left knee: Secondary | ICD-10-CM | POA: Diagnosis not present

## 2016-02-08 DIAGNOSIS — M25562 Pain in left knee: Secondary | ICD-10-CM | POA: Diagnosis not present

## 2016-02-08 DIAGNOSIS — R262 Difficulty in walking, not elsewhere classified: Secondary | ICD-10-CM | POA: Diagnosis not present

## 2016-02-08 DIAGNOSIS — M25662 Stiffness of left knee, not elsewhere classified: Secondary | ICD-10-CM | POA: Diagnosis not present

## 2016-02-10 DIAGNOSIS — M25562 Pain in left knee: Secondary | ICD-10-CM | POA: Diagnosis not present

## 2016-02-10 DIAGNOSIS — R262 Difficulty in walking, not elsewhere classified: Secondary | ICD-10-CM | POA: Diagnosis not present

## 2016-02-10 DIAGNOSIS — M25662 Stiffness of left knee, not elsewhere classified: Secondary | ICD-10-CM | POA: Diagnosis not present

## 2016-02-10 DIAGNOSIS — M1712 Unilateral primary osteoarthritis, left knee: Secondary | ICD-10-CM | POA: Diagnosis not present

## 2016-02-12 DIAGNOSIS — M25662 Stiffness of left knee, not elsewhere classified: Secondary | ICD-10-CM | POA: Diagnosis not present

## 2016-02-12 DIAGNOSIS — M1712 Unilateral primary osteoarthritis, left knee: Secondary | ICD-10-CM | POA: Diagnosis not present

## 2016-02-12 DIAGNOSIS — R262 Difficulty in walking, not elsewhere classified: Secondary | ICD-10-CM | POA: Diagnosis not present

## 2016-02-12 DIAGNOSIS — M25562 Pain in left knee: Secondary | ICD-10-CM | POA: Diagnosis not present

## 2016-02-15 DIAGNOSIS — M25562 Pain in left knee: Secondary | ICD-10-CM | POA: Diagnosis not present

## 2016-02-15 DIAGNOSIS — M25662 Stiffness of left knee, not elsewhere classified: Secondary | ICD-10-CM | POA: Diagnosis not present

## 2016-02-15 DIAGNOSIS — R262 Difficulty in walking, not elsewhere classified: Secondary | ICD-10-CM | POA: Diagnosis not present

## 2016-02-15 DIAGNOSIS — M1712 Unilateral primary osteoarthritis, left knee: Secondary | ICD-10-CM | POA: Diagnosis not present

## 2016-02-17 DIAGNOSIS — M25662 Stiffness of left knee, not elsewhere classified: Secondary | ICD-10-CM | POA: Diagnosis not present

## 2016-02-17 DIAGNOSIS — M25562 Pain in left knee: Secondary | ICD-10-CM | POA: Diagnosis not present

## 2016-02-17 DIAGNOSIS — R262 Difficulty in walking, not elsewhere classified: Secondary | ICD-10-CM | POA: Diagnosis not present

## 2016-02-17 DIAGNOSIS — M1712 Unilateral primary osteoarthritis, left knee: Secondary | ICD-10-CM | POA: Diagnosis not present

## 2016-02-19 DIAGNOSIS — M1712 Unilateral primary osteoarthritis, left knee: Secondary | ICD-10-CM | POA: Diagnosis not present

## 2016-02-19 DIAGNOSIS — R262 Difficulty in walking, not elsewhere classified: Secondary | ICD-10-CM | POA: Diagnosis not present

## 2016-02-19 DIAGNOSIS — M25662 Stiffness of left knee, not elsewhere classified: Secondary | ICD-10-CM | POA: Diagnosis not present

## 2016-02-19 DIAGNOSIS — M25562 Pain in left knee: Secondary | ICD-10-CM | POA: Diagnosis not present

## 2016-02-22 DIAGNOSIS — M1712 Unilateral primary osteoarthritis, left knee: Secondary | ICD-10-CM | POA: Diagnosis not present

## 2016-02-22 DIAGNOSIS — R262 Difficulty in walking, not elsewhere classified: Secondary | ICD-10-CM | POA: Diagnosis not present

## 2016-02-22 DIAGNOSIS — M25662 Stiffness of left knee, not elsewhere classified: Secondary | ICD-10-CM | POA: Diagnosis not present

## 2016-02-22 DIAGNOSIS — M25562 Pain in left knee: Secondary | ICD-10-CM | POA: Diagnosis not present

## 2016-02-23 DIAGNOSIS — M1712 Unilateral primary osteoarthritis, left knee: Secondary | ICD-10-CM | POA: Diagnosis not present

## 2016-02-24 DIAGNOSIS — M1712 Unilateral primary osteoarthritis, left knee: Secondary | ICD-10-CM | POA: Diagnosis not present

## 2016-02-24 DIAGNOSIS — M25662 Stiffness of left knee, not elsewhere classified: Secondary | ICD-10-CM | POA: Diagnosis not present

## 2016-02-24 DIAGNOSIS — R262 Difficulty in walking, not elsewhere classified: Secondary | ICD-10-CM | POA: Diagnosis not present

## 2016-02-24 DIAGNOSIS — M25562 Pain in left knee: Secondary | ICD-10-CM | POA: Diagnosis not present

## 2016-02-26 DIAGNOSIS — M25562 Pain in left knee: Secondary | ICD-10-CM | POA: Diagnosis not present

## 2016-02-26 DIAGNOSIS — R262 Difficulty in walking, not elsewhere classified: Secondary | ICD-10-CM | POA: Diagnosis not present

## 2016-02-26 DIAGNOSIS — M1712 Unilateral primary osteoarthritis, left knee: Secondary | ICD-10-CM | POA: Diagnosis not present

## 2016-02-26 DIAGNOSIS — M25662 Stiffness of left knee, not elsewhere classified: Secondary | ICD-10-CM | POA: Diagnosis not present

## 2016-02-29 DIAGNOSIS — M25662 Stiffness of left knee, not elsewhere classified: Secondary | ICD-10-CM | POA: Diagnosis not present

## 2016-02-29 DIAGNOSIS — R262 Difficulty in walking, not elsewhere classified: Secondary | ICD-10-CM | POA: Diagnosis not present

## 2016-02-29 DIAGNOSIS — M25562 Pain in left knee: Secondary | ICD-10-CM | POA: Diagnosis not present

## 2016-02-29 DIAGNOSIS — M1712 Unilateral primary osteoarthritis, left knee: Secondary | ICD-10-CM | POA: Diagnosis not present

## 2016-03-03 DIAGNOSIS — M25562 Pain in left knee: Secondary | ICD-10-CM | POA: Diagnosis not present

## 2016-03-03 DIAGNOSIS — R262 Difficulty in walking, not elsewhere classified: Secondary | ICD-10-CM | POA: Diagnosis not present

## 2016-03-03 DIAGNOSIS — M1712 Unilateral primary osteoarthritis, left knee: Secondary | ICD-10-CM | POA: Diagnosis not present

## 2016-03-03 DIAGNOSIS — M25662 Stiffness of left knee, not elsewhere classified: Secondary | ICD-10-CM | POA: Diagnosis not present

## 2016-03-08 DIAGNOSIS — R262 Difficulty in walking, not elsewhere classified: Secondary | ICD-10-CM | POA: Diagnosis not present

## 2016-03-08 DIAGNOSIS — M1712 Unilateral primary osteoarthritis, left knee: Secondary | ICD-10-CM | POA: Diagnosis not present

## 2016-03-08 DIAGNOSIS — M25662 Stiffness of left knee, not elsewhere classified: Secondary | ICD-10-CM | POA: Diagnosis not present

## 2016-03-08 DIAGNOSIS — M25562 Pain in left knee: Secondary | ICD-10-CM | POA: Diagnosis not present

## 2016-03-10 ENCOUNTER — Encounter: Payer: Self-pay | Admitting: Sports Medicine

## 2016-03-11 DIAGNOSIS — E039 Hypothyroidism, unspecified: Secondary | ICD-10-CM | POA: Diagnosis not present

## 2016-03-11 DIAGNOSIS — E7800 Pure hypercholesterolemia, unspecified: Secondary | ICD-10-CM | POA: Diagnosis not present

## 2016-03-11 DIAGNOSIS — Z5181 Encounter for therapeutic drug level monitoring: Secondary | ICD-10-CM | POA: Diagnosis not present

## 2016-03-11 DIAGNOSIS — Z79899 Other long term (current) drug therapy: Secondary | ICD-10-CM | POA: Diagnosis not present

## 2016-03-11 DIAGNOSIS — E890 Postprocedural hypothyroidism: Secondary | ICD-10-CM | POA: Diagnosis not present

## 2016-04-05 DIAGNOSIS — M25571 Pain in right ankle and joints of right foot: Secondary | ICD-10-CM | POA: Diagnosis not present

## 2016-04-05 DIAGNOSIS — M1712 Unilateral primary osteoarthritis, left knee: Secondary | ICD-10-CM | POA: Diagnosis not present

## 2016-04-18 NOTE — H&P (Signed)
Marilyn Rivas comes in today for a follow-up. Six weeks out from a left unicompartmental patellofemoral replacement knee.  She is doing well. She is advancing well.  She is using a cane but more because of pain at the posterior aspect of her right ankle. 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 This may eventually come down to treatment but we will get through things with her knee first. In regards to the knee she is doing well. Advancing in therapy.   Past medical, social and family history reviewed in detail on the patient questionnaire and signed.  Review of systems: As detailed in the HPI. All others reviewed and are negative.  EXAMINATION: Well-developed, well-nourished female in no acute distress. Alert and oriented times three.  Examination of knee shows the incision is well healed. Motion is 0-better than 100 degrees. Good alignment and good stability.  Fairly good but not full strength.  All of that was on the left. On the right she has pre Achilles bursitis, Haglund's deformity and soreness. The Achilles is a little bit tight. No plantar fasciitis. No defect in the Achilles tendon.    X-RAYS:  Three view X-rays of the left knee shows a good seating and alignment of all of her components.  Because of her patella baja her sunrise view really is not helpful at all.     PLAN:  1. In regards to the knee continue with therapy per protocol, advance as tolerated. Follow-up with me in six weeks when she is 12 weeks out. 2. In regards to her right heel, I explained to her what is going on. We will add some modalities with Achilles stretching and therapy. We will take this up further in regards to the need for definitive treatment when I see her in follow-up for her knee in six weeks.  I went over this with her and she understands.    Addendum: Marilyn Rivas comes in for follow up.  Status post left knee patellofemoral replacement by me on Jan 13, 2016.  Working in therapy.  She  is really doing quite well.  Significantly improved from pre-op.  Still some issues of some discomfort and needs to get her strength back, but overall she is very pleased with her improvement.   The other issue is her right heel.  Haglund deformity.  Attachment spurring Achilles.  Partial tearing distal Achilles.  This has reached a point that we went ahead and proceeded with scheduling for definitive treatment.  Something we have discussed in the past.  I reviewed her MRI and x-rays from March.  The plan is prone position, open intervention, excision of Haglund deformity and distal attachment spurs, debridement and then repair reattachment of the Achilles with a SpeedBridge technique.  Procedure, risks, benefits and complications reviewed in detail with her.  I have tried to encourage her to stretch, as her dorsiflexion is barely 0 degrees with her knee extended.  I don't think it is going to get much better until we repair this.  We have completed all paperwork for her procedure of her Achilles on the right.  She will continue with therapy, left knee.  I will see her at the time of operative intervention on the right heel, which is going to be on April 25, 2016.

## 2016-04-20 ENCOUNTER — Encounter (HOSPITAL_BASED_OUTPATIENT_CLINIC_OR_DEPARTMENT_OTHER): Payer: Self-pay | Admitting: *Deleted

## 2016-04-20 DIAGNOSIS — M25774 Osteophyte, right foot: Secondary | ICD-10-CM | POA: Diagnosis not present

## 2016-04-20 DIAGNOSIS — M7731 Calcaneal spur, right foot: Secondary | ICD-10-CM | POA: Diagnosis not present

## 2016-04-20 DIAGNOSIS — Z96652 Presence of left artificial knee joint: Secondary | ICD-10-CM | POA: Diagnosis not present

## 2016-04-20 DIAGNOSIS — Z87891 Personal history of nicotine dependence: Secondary | ICD-10-CM | POA: Diagnosis not present

## 2016-04-20 DIAGNOSIS — K219 Gastro-esophageal reflux disease without esophagitis: Secondary | ICD-10-CM | POA: Diagnosis not present

## 2016-04-20 DIAGNOSIS — Z79899 Other long term (current) drug therapy: Secondary | ICD-10-CM | POA: Diagnosis not present

## 2016-04-20 DIAGNOSIS — E039 Hypothyroidism, unspecified: Secondary | ICD-10-CM | POA: Diagnosis not present

## 2016-04-25 ENCOUNTER — Ambulatory Visit (HOSPITAL_BASED_OUTPATIENT_CLINIC_OR_DEPARTMENT_OTHER)
Admission: RE | Admit: 2016-04-25 | Discharge: 2016-04-25 | Disposition: A | Discharge status: Home / Self Care | Payer: Medicare Other | Source: Ambulatory Visit | Attending: Orthopedic Surgery | Admitting: Orthopedic Surgery

## 2016-04-25 ENCOUNTER — Ambulatory Visit (HOSPITAL_BASED_OUTPATIENT_CLINIC_OR_DEPARTMENT_OTHER): Payer: Medicare Other | Admitting: Anesthesiology

## 2016-04-25 ENCOUNTER — Encounter (HOSPITAL_BASED_OUTPATIENT_CLINIC_OR_DEPARTMENT_OTHER): Payer: Self-pay | Admitting: *Deleted

## 2016-04-25 ENCOUNTER — Encounter (HOSPITAL_BASED_OUTPATIENT_CLINIC_OR_DEPARTMENT_OTHER)
Admission: RE | Disposition: A | Discharge status: Home / Self Care | Payer: Self-pay | Source: Ambulatory Visit | Attending: Orthopedic Surgery

## 2016-04-25 DIAGNOSIS — K219 Gastro-esophageal reflux disease without esophagitis: Secondary | ICD-10-CM | POA: Diagnosis not present

## 2016-04-25 DIAGNOSIS — E039 Hypothyroidism, unspecified: Secondary | ICD-10-CM | POA: Insufficient documentation

## 2016-04-25 DIAGNOSIS — S86011A Strain of right Achilles tendon, initial encounter: Secondary | ICD-10-CM | POA: Diagnosis not present

## 2016-04-25 DIAGNOSIS — Z96652 Presence of left artificial knee joint: Secondary | ICD-10-CM | POA: Insufficient documentation

## 2016-04-25 DIAGNOSIS — Z79899 Other long term (current) drug therapy: Secondary | ICD-10-CM | POA: Insufficient documentation

## 2016-04-25 DIAGNOSIS — M79671 Pain in right foot: Secondary | ICD-10-CM | POA: Diagnosis not present

## 2016-04-25 DIAGNOSIS — Z87891 Personal history of nicotine dependence: Secondary | ICD-10-CM | POA: Insufficient documentation

## 2016-04-25 DIAGNOSIS — G8918 Other acute postprocedural pain: Secondary | ICD-10-CM | POA: Diagnosis not present

## 2016-04-25 DIAGNOSIS — M7731 Calcaneal spur, right foot: Secondary | ICD-10-CM | POA: Diagnosis not present

## 2016-04-25 DIAGNOSIS — M66361 Spontaneous rupture of flexor tendons, right lower leg: Secondary | ICD-10-CM | POA: Diagnosis not present

## 2016-04-25 DIAGNOSIS — M25774 Osteophyte, right foot: Secondary | ICD-10-CM | POA: Insufficient documentation

## 2016-04-25 HISTORY — PX: ACHILLES TENDON SURGERY: SHX542

## 2016-04-25 SURGERY — REPAIR, TENDON, ACHILLES
Anesthesia: Regional | Site: Foot | Laterality: Right

## 2016-04-25 MED ORDER — SUCCINYLCHOLINE CHLORIDE 20 MG/ML IJ SOLN
INTRAMUSCULAR | Status: DC | PRN
  Administered 2016-04-25: 60 mg via INTRAVENOUS

## 2016-04-25 MED ORDER — SUCCINYLCHOLINE CHLORIDE 200 MG/10ML IV SOSY
PREFILLED_SYRINGE | INTRAVENOUS | Status: AC
  Filled 2016-04-25: qty 10

## 2016-04-25 MED ORDER — DEXAMETHASONE SODIUM PHOSPHATE 10 MG/ML IJ SOLN
INTRAMUSCULAR | Status: AC
  Filled 2016-04-25: qty 1

## 2016-04-25 MED ORDER — CHLORHEXIDINE GLUCONATE 4 % EX LIQD: 60.0000 mL | Freq: Once | CUTANEOUS | Status: DC

## 2016-04-25 MED ORDER — ACETAMINOPHEN 325 MG PO TABS: 325.0000 mg | ORAL_TABLET | ORAL | Status: DC | PRN

## 2016-04-25 MED ORDER — LIDOCAINE 2% (20 MG/ML) 5 ML SYRINGE
INTRAMUSCULAR | Status: AC
  Filled 2016-04-25: qty 10

## 2016-04-25 MED ORDER — FENTANYL CITRATE (PF) 100 MCG/2ML IJ SOLN: 25.0000 ug | INTRAMUSCULAR | Status: DC | PRN

## 2016-04-25 MED ORDER — OXYCODONE HCL 5 MG/5ML PO SOLN: 5.0000 mg | Freq: Once | ORAL | Status: DC | PRN

## 2016-04-25 MED ORDER — OXYCODONE HCL 5 MG PO TABS: 5.0000 mg | ORAL_TABLET | Freq: Once | ORAL | Status: DC | PRN

## 2016-04-25 MED ORDER — CLINDAMYCIN PHOSPHATE 900 MG/50ML IV SOLN
INTRAVENOUS | Status: AC
  Filled 2016-04-25: qty 50

## 2016-04-25 MED ORDER — ACETAMINOPHEN 160 MG/5ML PO SOLN: 325.0000 mg | ORAL | Status: DC | PRN

## 2016-04-25 MED ORDER — FENTANYL CITRATE (PF) 100 MCG/2ML IJ SOLN
INTRAMUSCULAR | Status: AC
  Filled 2016-04-25: qty 2

## 2016-04-25 MED ORDER — GLYCOPYRROLATE 0.2 MG/ML IJ SOLN: 0.2000 mg | Freq: Once | INTRAMUSCULAR | Status: DC | PRN

## 2016-04-25 MED ORDER — OXYCODONE-ACETAMINOPHEN 5-325 MG PO TABS: 1.0000 | ORAL_TABLET | ORAL | 0 refills | Status: DC | PRN

## 2016-04-25 MED ORDER — SCOPOLAMINE 1 MG/3DAYS TD PT72: 1.0000 | MEDICATED_PATCH | Freq: Once | TRANSDERMAL | Status: DC | PRN

## 2016-04-25 MED ORDER — PROPOFOL 500 MG/50ML IV EMUL
INTRAVENOUS | Status: AC
  Filled 2016-04-25: qty 50

## 2016-04-25 MED ORDER — LACTATED RINGERS IV SOLN
INTRAVENOUS | Status: DC
  Administered 2016-04-25 (×2): via INTRAVENOUS

## 2016-04-25 MED ORDER — BUPIVACAINE-EPINEPHRINE (PF) 0.5% -1:200000 IJ SOLN
INTRAMUSCULAR | Status: DC | PRN
  Administered 2016-04-25: 30 mL via PERINEURAL

## 2016-04-25 MED ORDER — PROPOFOL 10 MG/ML IV BOLUS
INTRAVENOUS | Status: DC | PRN
  Administered 2016-04-25: 130 mg via INTRAVENOUS

## 2016-04-25 MED ORDER — MIDAZOLAM HCL 2 MG/2ML IJ SOLN: 1.0000 mg | INTRAMUSCULAR | Status: DC | PRN

## 2016-04-25 MED ORDER — LIDOCAINE HCL 4 % EX SOLN
CUTANEOUS | Status: DC | PRN
  Administered 2016-04-25: 3 mL via TOPICAL

## 2016-04-25 MED ORDER — LIDOCAINE HCL (CARDIAC) 20 MG/ML IV SOLN
INTRAVENOUS | Status: DC | PRN
  Administered 2016-04-25: 60 mg via INTRAVENOUS

## 2016-04-25 MED ORDER — DEXAMETHASONE SODIUM PHOSPHATE 4 MG/ML IJ SOLN
INTRAMUSCULAR | Status: DC | PRN
  Administered 2016-04-25: 5 mg via INTRAVENOUS

## 2016-04-25 MED ORDER — LACTATED RINGERS IV SOLN: INTRAVENOUS | Status: DC

## 2016-04-25 MED ORDER — MIDAZOLAM HCL 2 MG/2ML IJ SOLN
INTRAMUSCULAR | Status: AC
  Filled 2016-04-25: qty 2

## 2016-04-25 MED ORDER — EPHEDRINE SULFATE 50 MG/ML IJ SOLN
INTRAMUSCULAR | Status: DC | PRN
  Administered 2016-04-25 (×2): 10 mg via INTRAVENOUS

## 2016-04-25 MED ORDER — CLINDAMYCIN PHOSPHATE 900 MG/50ML IV SOLN
900.0000 mg | INTRAVENOUS | Status: AC
  Administered 2016-04-25: 900 mg via INTRAVENOUS

## 2016-04-25 MED ORDER — FENTANYL CITRATE (PF) 100 MCG/2ML IJ SOLN
50.0000 ug | INTRAMUSCULAR | Status: DC | PRN
  Administered 2016-04-25: 100 ug via INTRAVENOUS
  Administered 2016-04-25: 50 ug via INTRAVENOUS

## 2016-04-25 SURGICAL SUPPLY — 74 items
ANCHOR SUT GL2 QUATTRO 2.9 (Anchor) ×6 IMPLANT
ANCHOR SUT QUATTRO KNTLS 4.5 (Anchor) ×6 IMPLANT
BANDAGE ACE 4X5 VEL STRL LF (GAUZE/BANDAGES/DRESSINGS) ×3 IMPLANT
BANDAGE ACE 6X5 VEL STRL LF (GAUZE/BANDAGES/DRESSINGS) ×3 IMPLANT
BANDAGE ESMARK 6X9 LF (GAUZE/BANDAGES/DRESSINGS) ×1 IMPLANT
BLADE AVERAGE 25MMX9MM (BLADE) ×1
BLADE AVERAGE 25X9 (BLADE) ×2 IMPLANT
BLADE SURG 15 STRL LF DISP TIS (BLADE) ×2 IMPLANT
BLADE SURG 15 STRL SS (BLADE) ×4
BNDG COHESIVE 4X5 TAN STRL (GAUZE/BANDAGES/DRESSINGS) ×3 IMPLANT
BNDG ESMARK 6X9 LF (GAUZE/BANDAGES/DRESSINGS) ×3
CANISTER SUCT 1200ML W/VALVE (MISCELLANEOUS) ×3 IMPLANT
COVER BACK TABLE 60X90IN (DRAPES) ×3 IMPLANT
CUFF TOURNIQUET SINGLE 34IN LL (TOURNIQUET CUFF) ×3 IMPLANT
DRAPE EXTREMITY T 121X128X90 (DRAPE) ×3 IMPLANT
DRAPE IMP U-DRAPE 54X76 (DRAPES) ×3 IMPLANT
DRAPE OEC MINIVIEW 54X84 (DRAPES) IMPLANT
DRAPE U-SHAPE 47X51 STRL (DRAPES) ×3 IMPLANT
DRSG PAD ABDOMINAL 8X10 ST (GAUZE/BANDAGES/DRESSINGS) ×3 IMPLANT
DURAPREP 26ML APPLICATOR (WOUND CARE) ×3 IMPLANT
ELECT REM PT RETURN 9FT ADLT (ELECTROSURGICAL) ×3
ELECTRODE REM PT RTRN 9FT ADLT (ELECTROSURGICAL) ×1 IMPLANT
GAUZE SPONGE 4X4 12PLY STRL (GAUZE/BANDAGES/DRESSINGS) ×3 IMPLANT
GAUZE XEROFORM 1X8 LF (GAUZE/BANDAGES/DRESSINGS) ×3 IMPLANT
GLOVE BIO SURGEON STRL SZ7.5 (GLOVE) ×3 IMPLANT
GLOVE BIOGEL PI IND STRL 7.0 (GLOVE) ×3 IMPLANT
GLOVE BIOGEL PI INDICATOR 7.0 (GLOVE) ×6
GLOVE ECLIPSE 6.5 STRL STRAW (GLOVE) ×3 IMPLANT
GLOVE ECLIPSE 7.0 STRL STRAW (GLOVE) ×3 IMPLANT
GLOVE SURG ORTHO 8.0 STRL STRW (GLOVE) ×3 IMPLANT
GOWN STRL REUS W/ TWL LRG LVL3 (GOWN DISPOSABLE) ×1 IMPLANT
GOWN STRL REUS W/ TWL XL LVL3 (GOWN DISPOSABLE) ×2 IMPLANT
GOWN STRL REUS W/TWL LRG LVL3 (GOWN DISPOSABLE) ×2
GOWN STRL REUS W/TWL XL LVL3 (GOWN DISPOSABLE) ×4
NDL SUT 6 .5 CRC .975X.05 MAYO (NEEDLE) ×1 IMPLANT
NEEDLE 1/2 CIR CATGUT .05X1.09 (NEEDLE) IMPLANT
NEEDLE HYPO 22GX1.5 SAFETY (NEEDLE) IMPLANT
NEEDLE MAYO TAPER (NEEDLE) ×2
PACK BASIN DAY SURGERY FS (CUSTOM PROCEDURE TRAY) ×3 IMPLANT
PAD CAST 4YDX4 CTTN HI CHSV (CAST SUPPLIES) ×2 IMPLANT
PADDING CAST ABS 4INX4YD NS (CAST SUPPLIES) ×4
PADDING CAST ABS COTTON 4X4 ST (CAST SUPPLIES) ×2 IMPLANT
PADDING CAST COTTON 4X4 STRL (CAST SUPPLIES) ×4
PADDING CAST COTTON 6X4 STRL (CAST SUPPLIES) IMPLANT
PENCIL BUTTON HOLSTER BLD 10FT (ELECTRODE) ×3 IMPLANT
SLEEVE SCD COMPRESS KNEE MED (MISCELLANEOUS) ×3 IMPLANT
SPLINT FAST PLASTER 5X30 (CAST SUPPLIES) ×40
SPLINT FIBERGLASS 4X30 (CAST SUPPLIES) IMPLANT
SPLINT PLASTER CAST FAST 5X30 (CAST SUPPLIES) ×20 IMPLANT
SPONGE LAP 4X18 X RAY DECT (DISPOSABLE) ×3 IMPLANT
STAPLER VISISTAT 35W (STAPLE) IMPLANT
STOCKINETTE 6  STRL (DRAPES) ×2
STOCKINETTE 6 STRL (DRAPES) ×1 IMPLANT
SUCTION FRAZIER HANDLE 10FR (MISCELLANEOUS)
SUCTION TUBE FRAZIER 10FR DISP (MISCELLANEOUS) IMPLANT
SUT ETHILON 3 0 PS 1 (SUTURE) IMPLANT
SUT FIBERWIRE #2 38 T-5 BLUE (SUTURE)
SUT VIC AB 0 CT1 27 (SUTURE)
SUT VIC AB 0 CT1 27XBRD ANBCTR (SUTURE) IMPLANT
SUT VIC AB 1 CT1 27 (SUTURE)
SUT VIC AB 1 CT1 27XBRD ANBCTR (SUTURE) IMPLANT
SUT VIC AB 2-0 SH 27 (SUTURE) ×2
SUT VIC AB 2-0 SH 27XBRD (SUTURE) ×1 IMPLANT
SUT VIC AB 3-0 SH 27 (SUTURE)
SUT VIC AB 3-0 SH 27X BRD (SUTURE) IMPLANT
SUTURE FIBERWR #2 38 T-5 BLUE (SUTURE) IMPLANT
SYR BULB 3OZ (MISCELLANEOUS) ×3 IMPLANT
SYR CONTROL 10ML LL (SYRINGE) IMPLANT
TOWEL OR 17X24 6PK STRL BLUE (TOWEL DISPOSABLE) ×6 IMPLANT
TOWEL OR NON WOVEN STRL DISP B (DISPOSABLE) ×3 IMPLANT
TUBE CONNECTING 20'X1/4 (TUBING) ×1
TUBE CONNECTING 20X1/4 (TUBING) ×2 IMPLANT
UNDERPAD 30X30 (UNDERPADS AND DIAPERS) ×3 IMPLANT
YANKAUER SUCT BULB TIP NO VENT (SUCTIONS) ×3 IMPLANT

## 2016-04-25 NOTE — Interval H&P Note (Signed)
History and Physical Interval Note:  04/25/2016 7:33 AM  Marilyn Rivas  has presented today for surgery, with the diagnosis of OSTEOPHYTE RIGHT FOOT, ACHILLES TENDON RUPTURE ON RIGHT  The various methods of treatment have been discussed with the patient and family. After consideration of risks, benefits and other options for treatment, the patient has consented to  Procedure(s): ACHILLES TENDON REPAIR,PARTIAL EXCISION CALCANEOUS (Right) as a surgical intervention .  The patient's history has been reviewed, patient examined, no change in status, stable for surgery.  I have reviewed the patient's chart and labs.  Questions were answered to the patient's satisfaction.     [name]

## 2016-04-25 NOTE — Anesthesia Procedure Notes (Signed)
Anesthesia Regional Block:  Popliteal block  Pre-Anesthetic Checklist: ,, timeout performed, Correct Patient, Correct Site, Correct Laterality, Correct Procedure, Correct Position, site marked, Risks and benefits discussed,  Surgical consent,  Pre-op evaluation,  At surgeon's request and post-op pain management  Laterality: Lower and Right  Prep: chloraprep       Needles:  Injection technique: Single-shot  Needle Type: Echogenic Stimulator Needle          Additional Needles:  Procedures: ultrasound guided (picture in chart) Popliteal block Narrative:  Injection made incrementally with aspirations every 5 mL.  Performed by: Personally  Anesthesiologist: Aletheia Tangredi  Additional Notes: H+P and labs reviewed, risks and benefits discussed with patient, procedure tolerated well without complications

## 2016-04-25 NOTE — Discharge Instructions (Signed)
May apply antiobiotic ointment and non-stick dressing to Rt upper thigh if desired.  Care After Refer to this sheet in the next few weeks. These discharge instructions provide you with general information on caring for yourself after you leave the hospital. Your caregiver may also give you specific instructions. Your treatment has been planned according to the most current medical practices available, but unavoidable complications sometimes occur. If you have any problems or questions after discharge, please call your caregiver. HOME INSTRUCTIONS You may resume a normal diet and activities as directed. Walk with crutches NON WEIGHT BEARING Do NOT get cast wet.  Do NOT remove dressing until you come back to the doctor Only take over-the-counter or prescription medicines for pain, discomfort, or fever as directed by your caregiver.  Eat a well-balanced diet.  Avoid lifting or driving until you are instructed otherwise.  Make an appointment to see your caregiver for stitches (suture) or staple removal as directed.   SEEK MEDICAL CARE IF: You have swelling of your calf or leg.  You develop shortness of breath or chest pain.  You have redness, swelling, or increasing pain in the wound.  There is pus or any unusual drainage coming from the surgical site.  You notice a bad smell coming from the surgical site or dressing.  The surgical site breaks open after sutures or staples have been removed.  There is persistent bleeding from the suture or staple line.  You are getting worse or are not improving.  You have any other questions or concerns.  SEEK IMMEDIATE MEDICAL CARE IF:  You have a fever.  You develop a rash.  You have difficulty breathing.  You develop any reaction or side effects to medicines given.  Your knee motion is decreasing rather than improving.  MAKE SURE YOU:  Understand these instructions.  Will watch your condition.  Will get help right away if you are not doing well or get  worse.   Regional Anesthesia Blocks  1. Numbness or the inability to move the "blocked" extremity may last from 3-48 hours after placement. The length of time depends on the medication injected and your individual response to the medication. If the numbness is not going away after 48 hours, call your surgeon.  2. The extremity that is blocked will need to be protected until the numbness is gone and the  Strength has returned. Because you cannot feel it, you will need to take extra care to avoid injury. Because it may be weak, you may have difficulty moving it or using it. You may not know what position it is in without looking at it while the block is in effect.  3. For blocks in the legs and feet, returning to weight bearing and walking needs to be done carefully. You will need to wait until the numbness is entirely gone and the strength has returned. You should be able to move your leg and foot normally before you try and bear weight or walk. You will need someone to be with you when you first try to ensure you do not fall and possibly risk injury.  4. Bruising and tenderness at the needle site are common side effects and will resolve in a few days.  5. Persistent numbness or new problems with movement should be communicated to the surgeon or the [facility] [phone])/ [facility] [phone]).  Post Anesthesia Home Care Instructions  Activity: Get plenty of rest for the remainder of the day. A  responsible adult should stay with you for 24 hours following the procedure.  For the next 24 hours, DO NOT: -Drive a car -Paediatric nurse -Drink alcoholic beverages -Take any medication unless instructed by your physician -Make any legal decisions or sign important papers.  Meals: Start with liquid foods such as gelatin or soup. Progress to regular foods as tolerated. Avoid greasy, spicy, heavy foods. If nausea and/or vomiting occur, drink only clear liquids until  the nausea and/or vomiting subsides. Call your physician if vomiting continues.  Special Instructions/Symptoms: Your throat may feel dry or sore from the anesthesia or the breathing tube placed in your throat during surgery. If this causes discomfort, gargle with warm salt water. The discomfort should disappear within 24 hours.  If you had a scopolamine patch placed behind your ear for the management of post- operative nausea and/or vomiting:  1. The medication in the patch is effective for 72 hours, after which it should be removed.  Wrap patch in a tissue and discard in the trash. Wash hands thoroughly with soap and water. 2. You may remove the patch earlier than 72 hours if you experience unpleasant side effects which may include dry mouth, dizziness or visual disturbances. 3. Avoid touching the patch. Wash your hands with soap and water after contact with the patch.

## 2016-04-25 NOTE — Anesthesia Postprocedure Evaluation (Signed)
Anesthesia Post Note  Patient: Marilyn Rivas  Procedure(s) Performed: Procedure(s) (LRB): ACHILLES TENDON REPAIR,PARTIAL EXCISION CALCANEOUS (Right)  Patient location during evaluation: PACU Anesthesia Type: General and Regional Level of consciousness: awake Pain management: pain level controlled Vital Signs Assessment: post-procedure vital signs reviewed and stable Respiratory status: spontaneous breathing Cardiovascular status: stable Postop Assessment: no signs of nausea or vomiting Anesthetic complications: no    Last Vitals:  Vitals:   04/25/16 0953 04/25/16 1026  BP:  130/79  Pulse: 85 89  Resp: (!) 21 (!) 89  Temp:  36.6 C    Last Pain:  Vitals:   04/25/16 1026  TempSrc:   PainSc: 0-No pain                 Huyen Perazzo

## 2016-04-25 NOTE — Anesthesia Procedure Notes (Signed)
Performed by: Rayane Gallardo C       

## 2016-04-25 NOTE — Anesthesia Preprocedure Evaluation (Signed)
Anesthesia Evaluation  Patient identified by MRN, date of birth, ID band Patient awake    Reviewed: Allergy & Precautions, NPO status , Patient's Chart, lab work & pertinent test results  History of Anesthesia Complications Negative for: history of anesthetic complications  Airway Mallampati: II  TM Distance: >3 FB Neck ROM: Full    Dental  (+) Teeth Intact   Pulmonary neg shortness of breath, neg sleep apnea, neg COPD, neg recent URI, former smoker,    breath sounds clear to auscultation       Cardiovascular negative cardio ROS   Rhythm:Regular     Neuro/Psych PSYCHIATRIC DISORDERS Anxiety Depression  Neuromuscular disease    GI/Hepatic Neg liver ROS, GERD  Medicated and Controlled,  Endo/Other  Hypothyroidism   Renal/GU negative Renal ROS     Musculoskeletal  (+) Arthritis ,   Abdominal   Peds  Hematology negative hematology ROS (+)   Anesthesia Other Findings   Reproductive/Obstetrics                             Anesthesia Physical Anesthesia Plan  ASA: II  Anesthesia Plan: General and Regional   Post-op Pain Management:  Regional for Post-op pain   Induction: Intravenous  Airway Management Planned: Oral ETT  Additional Equipment: None  Intra-op Plan:   Post-operative Plan: Extubation in OR  Informed Consent: I have reviewed the patients History and Physical, chart, labs and discussed the procedure including the risks, benefits and alternatives for the proposed anesthesia with the patient or authorized representative who has indicated his/her understanding and acceptance.   Dental advisory given  Plan Discussed with: CRNA and Surgeon  Anesthesia Plan Comments:         Anesthesia Quick Evaluation

## 2016-04-25 NOTE — Transfer of Care (Signed)
Immediate Anesthesia Transfer of Care Note  Patient: [name]  Procedure(s) Performed: Procedure(s): ACHILLES TENDON REPAIR,PARTIAL EXCISION CALCANEOUS (Right)  Patient Location: PACU  Anesthesia Type:GA combined with regional for post-op pain  Level of Consciousness: sedated  Airway & Oxygen Therapy: Patient Spontanous Breathing and Patient connected to face mask oxygen  Post-op Assessment: Report given to RN and Post -op Vital signs reviewed and stable  Post vital signs: Reviewed and stable  Last Vitals:  Vitals:   04/25/16 0735 04/25/16 0903  BP: (!) 150/92 (!) 148/87  Pulse: 72 100  Resp: 17 (!) 29  Temp:      Last Pain:  Vitals:   04/25/16 0621  TempSrc: Oral  PainSc: 5       Patients Stated Pain Goal: 1 ([id number])  Complications: No apparent anesthesia complications

## 2016-04-25 NOTE — Anesthesia Procedure Notes (Signed)
Procedure Name: Intubation Date/Time: 04/25/2016 7:50 AM Performed by: Maryella Shivers Pre-anesthesia Checklist: Patient identified, Emergency Drugs available, Suction available and Patient being monitored Patient Re-evaluated:Patient Re-evaluated prior to inductionOxygen Delivery Method: Circle system utilized Preoxygenation: Pre-oxygenation with 100% oxygen Intubation Type: IV induction Ventilation: Mask ventilation without difficulty Laryngoscope Size: Mac and 3 Grade View: Grade II Tube type: Oral Tube size: 7.0 mm Number of attempts: 1 Airway Equipment and Method: Stylet and Oral airway Placement Confirmation: ETT inserted through vocal cords under direct vision,  positive ETCO2 and breath sounds checked- equal and bilateral Secured at: 20 cm Tube secured with: Tape Dental Injury: Teeth and Oropharynx as per pre-operative assessment

## 2016-04-26 ENCOUNTER — Encounter (HOSPITAL_BASED_OUTPATIENT_CLINIC_OR_DEPARTMENT_OTHER): Payer: Self-pay | Admitting: Orthopedic Surgery

## 2016-04-26 NOTE — Op Note (Signed)
NAMEMarland Kitchen  TAURUS, WEIAND NO.:  [id number]  MEDICAL RECORD NO.:  [id number]  LOCATION:                                 FACILITY:  PHYSICIAN:  [name], M.D. DATE OF BIRTH:  08/07/1941  DATE OF PROCEDURE:  04/25/2016 DATE OF DISCHARGE:                              OPERATIVE REPORT   PREOPERATIVE DIAGNOSES: 1. Right heel Haglund deformity, symptomatic. 2. Tearing tendinopathy, distal Achilles tendon with osteophytes there     as well.  POSTOPERATIVE DIAGNOSES: 1. Right heel Haglund deformity, symptomatic. 2. Tearing tendinopathy, distal Achilles tendon with osteophytes there     as well.  PROCEDURES: 1. Left heel exploration. 2. Removal of Haglund deformity and attachment spurs. 3. Detachment, debridement, and then primary repair of Achilles tendon     utilizing a [employer] SpeedBridge technique.  SURGEON:  [name], M.D.  ASSISTANT:  Elmyra Ricks, PA, present throughout the entire case and necessary for timely completion of procedure.  ANESTHESIA:  General.  BLOOD LOSS:  Minimal.  SPECIMENS:  None.  CULTURES:  None.  COMPLICATIONS:  None.  DRESSINGS:  Sterile compressive.  Short leg splint.  TOURNIQUET TIME:  45 minutes.  DESCRIPTION OF PROCEDURE:  The patient was brought to the operating room, and after adequate anesthesia had been obtained, tourniquet applied, turned to a prone position with appropriate padding and support.  Exsanguinated with elevation of Esmarch.  Tourniquet inflated to 350 mmHg.  Longitudinal incision along the lateral border of the Achilles going down to the os calcis.  Skin and subcutaneous tissue divided.  Marked tendinopathy tearing to distal Achilles.  Taken down from the distal attachment, retracted proximally, debrided back to healthy tissue.  I then took off the Haglund deformity, all the attachment spurs, confirming this adequately with fluoroscopy.  I then made #4 drill holes at the attachment  site.  The proximal anchors were placed.  Sutures were brought through the Achilles, crossed over, and then brought distally and anchored down to the 2 distal holes with 2 anchors there.  At completion, a nice, firm solid repair.  I can get a plantigrade with the knee flexed.  Wound irrigated.  Closed with nylon. Sterile compressive dressing applied.  Short-leg splint applied. Anesthesia reversed.  Brought to the recovery room.  Tolerated the surgery well.  No complications.     [name], M.D.   ______________________________ [name], M.D.    DFM/MEDQ  D:  04/25/2016  T:  04/26/2016  Job:  [id number]

## 2016-05-03 DIAGNOSIS — M25571 Pain in right ankle and joints of right foot: Secondary | ICD-10-CM | POA: Diagnosis not present

## 2016-05-03 DIAGNOSIS — M25774 Osteophyte, right foot: Secondary | ICD-10-CM | POA: Diagnosis not present

## 2016-05-08 DIAGNOSIS — M25571 Pain in right ankle and joints of right foot: Secondary | ICD-10-CM | POA: Diagnosis not present

## 2016-05-10 DIAGNOSIS — M25571 Pain in right ankle and joints of right foot: Secondary | ICD-10-CM | POA: Diagnosis not present

## 2016-05-16 DIAGNOSIS — E039 Hypothyroidism, unspecified: Secondary | ICD-10-CM | POA: Diagnosis not present

## 2016-05-20 DIAGNOSIS — M25571 Pain in right ankle and joints of right foot: Secondary | ICD-10-CM | POA: Diagnosis not present

## 2016-05-31 ENCOUNTER — Encounter: Payer: Self-pay | Admitting: Internal Medicine

## 2016-06-07 DIAGNOSIS — M25571 Pain in right ankle and joints of right foot: Secondary | ICD-10-CM | POA: Diagnosis not present

## 2016-06-12 DIAGNOSIS — Z23 Encounter for immunization: Secondary | ICD-10-CM | POA: Diagnosis not present

## 2016-06-14 DIAGNOSIS — M25571 Pain in right ankle and joints of right foot: Secondary | ICD-10-CM | POA: Diagnosis not present

## 2016-06-14 DIAGNOSIS — M66361 Spontaneous rupture of flexor tendons, right lower leg: Secondary | ICD-10-CM | POA: Diagnosis not present

## 2016-06-14 DIAGNOSIS — M6281 Muscle weakness (generalized): Secondary | ICD-10-CM | POA: Diagnosis not present

## 2016-06-14 DIAGNOSIS — M7661 Achilles tendinitis, right leg: Secondary | ICD-10-CM | POA: Diagnosis not present

## 2016-06-16 DIAGNOSIS — M25571 Pain in right ankle and joints of right foot: Secondary | ICD-10-CM | POA: Diagnosis not present

## 2016-06-16 DIAGNOSIS — M6281 Muscle weakness (generalized): Secondary | ICD-10-CM | POA: Diagnosis not present

## 2016-06-16 DIAGNOSIS — M66361 Spontaneous rupture of flexor tendons, right lower leg: Secondary | ICD-10-CM | POA: Diagnosis not present

## 2016-06-16 DIAGNOSIS — M7661 Achilles tendinitis, right leg: Secondary | ICD-10-CM | POA: Diagnosis not present

## 2016-06-20 DIAGNOSIS — E7800 Pure hypercholesterolemia, unspecified: Secondary | ICD-10-CM | POA: Diagnosis not present

## 2016-06-21 DIAGNOSIS — M7661 Achilles tendinitis, right leg: Secondary | ICD-10-CM | POA: Diagnosis not present

## 2016-06-21 DIAGNOSIS — M810 Age-related osteoporosis without current pathological fracture: Secondary | ICD-10-CM | POA: Diagnosis not present

## 2016-06-21 DIAGNOSIS — M6281 Muscle weakness (generalized): Secondary | ICD-10-CM | POA: Diagnosis not present

## 2016-06-21 DIAGNOSIS — M25571 Pain in right ankle and joints of right foot: Secondary | ICD-10-CM | POA: Diagnosis not present

## 2016-06-21 DIAGNOSIS — M66361 Spontaneous rupture of flexor tendons, right lower leg: Secondary | ICD-10-CM | POA: Diagnosis not present

## 2016-06-24 DIAGNOSIS — M25571 Pain in right ankle and joints of right foot: Secondary | ICD-10-CM | POA: Diagnosis not present

## 2016-06-24 DIAGNOSIS — M6281 Muscle weakness (generalized): Secondary | ICD-10-CM | POA: Diagnosis not present

## 2016-06-24 DIAGNOSIS — M7661 Achilles tendinitis, right leg: Secondary | ICD-10-CM | POA: Diagnosis not present

## 2016-06-24 DIAGNOSIS — M66361 Spontaneous rupture of flexor tendons, right lower leg: Secondary | ICD-10-CM | POA: Diagnosis not present

## 2016-06-28 DIAGNOSIS — E784 Other hyperlipidemia: Secondary | ICD-10-CM | POA: Diagnosis not present

## 2016-06-28 DIAGNOSIS — E039 Hypothyroidism, unspecified: Secondary | ICD-10-CM | POA: Diagnosis not present

## 2016-06-30 DIAGNOSIS — M6281 Muscle weakness (generalized): Secondary | ICD-10-CM | POA: Diagnosis not present

## 2016-06-30 DIAGNOSIS — M7661 Achilles tendinitis, right leg: Secondary | ICD-10-CM | POA: Diagnosis not present

## 2016-06-30 DIAGNOSIS — M66361 Spontaneous rupture of flexor tendons, right lower leg: Secondary | ICD-10-CM | POA: Diagnosis not present

## 2016-06-30 DIAGNOSIS — M25571 Pain in right ankle and joints of right foot: Secondary | ICD-10-CM | POA: Diagnosis not present

## 2016-07-04 DIAGNOSIS — M25571 Pain in right ankle and joints of right foot: Secondary | ICD-10-CM | POA: Diagnosis not present

## 2016-07-04 DIAGNOSIS — M7661 Achilles tendinitis, right leg: Secondary | ICD-10-CM | POA: Diagnosis not present

## 2016-07-04 DIAGNOSIS — M6281 Muscle weakness (generalized): Secondary | ICD-10-CM | POA: Diagnosis not present

## 2016-07-04 DIAGNOSIS — M66361 Spontaneous rupture of flexor tendons, right lower leg: Secondary | ICD-10-CM | POA: Diagnosis not present

## 2016-07-07 ENCOUNTER — Emergency Department (HOSPITAL_COMMUNITY): Payer: Medicare Other

## 2016-07-07 ENCOUNTER — Encounter (HOSPITAL_COMMUNITY): Payer: Self-pay

## 2016-07-07 ENCOUNTER — Observation Stay (HOSPITAL_COMMUNITY)
Admission: EM | Admit: 2016-07-07 | Discharge: 2016-07-08 | Disposition: A | Discharge status: Home / Self Care | Payer: Medicare Other | Attending: Internal Medicine | Admitting: Internal Medicine

## 2016-07-07 DIAGNOSIS — K449 Diaphragmatic hernia without obstruction or gangrene: Secondary | ICD-10-CM | POA: Diagnosis not present

## 2016-07-07 DIAGNOSIS — Z79899 Other long term (current) drug therapy: Secondary | ICD-10-CM | POA: Insufficient documentation

## 2016-07-07 DIAGNOSIS — M1990 Unspecified osteoarthritis, unspecified site: Secondary | ICD-10-CM | POA: Insufficient documentation

## 2016-07-07 DIAGNOSIS — Z8249 Family history of ischemic heart disease and other diseases of the circulatory system: Secondary | ICD-10-CM | POA: Insufficient documentation

## 2016-07-07 DIAGNOSIS — I10 Essential (primary) hypertension: Secondary | ICD-10-CM | POA: Diagnosis not present

## 2016-07-07 DIAGNOSIS — K219 Gastro-esophageal reflux disease without esophagitis: Secondary | ICD-10-CM | POA: Diagnosis not present

## 2016-07-07 DIAGNOSIS — R0789 Other chest pain: Secondary | ICD-10-CM | POA: Diagnosis not present

## 2016-07-07 DIAGNOSIS — E039 Hypothyroidism, unspecified: Secondary | ICD-10-CM | POA: Diagnosis not present

## 2016-07-07 DIAGNOSIS — R079 Chest pain, unspecified: Secondary | ICD-10-CM | POA: Diagnosis present

## 2016-07-07 DIAGNOSIS — Z96652 Presence of left artificial knee joint: Secondary | ICD-10-CM | POA: Insufficient documentation

## 2016-07-07 DIAGNOSIS — L7632 Postprocedural hematoma of skin and subcutaneous tissue following other procedure: Secondary | ICD-10-CM | POA: Diagnosis not present

## 2016-07-07 DIAGNOSIS — Y838 Other surgical procedures as the cause of abnormal reaction of the patient, or of later complication, without mention of misadventure at the time of the procedure: Secondary | ICD-10-CM | POA: Insufficient documentation

## 2016-07-07 DIAGNOSIS — I700 Atherosclerosis of aorta: Secondary | ICD-10-CM | POA: Diagnosis not present

## 2016-07-07 DIAGNOSIS — I209 Angina pectoris, unspecified: Secondary | ICD-10-CM

## 2016-07-07 DIAGNOSIS — K5790 Diverticulosis of intestine, part unspecified, without perforation or abscess without bleeding: Secondary | ICD-10-CM | POA: Diagnosis not present

## 2016-07-07 DIAGNOSIS — Z87891 Personal history of nicotine dependence: Secondary | ICD-10-CM | POA: Diagnosis not present

## 2016-07-07 DIAGNOSIS — R739 Hyperglycemia, unspecified: Secondary | ICD-10-CM | POA: Insufficient documentation

## 2016-07-07 LAB — BASIC METABOLIC PANEL
ANION GAP: 8 (ref 5–15)
BUN: 8 mg/dL (ref 6–20)
CALCIUM: 9.6 mg/dL (ref 8.9–10.3)
CO2: 24 mmol/L (ref 22–32)
CREATININE: 0.76 mg/dL (ref 0.44–1.00)
Chloride: 105 mmol/L (ref 101–111)
GLUCOSE: 246 mg/dL — AB (ref 65–99)
Potassium: 3.6 mmol/L (ref 3.5–5.1)
Sodium: 137 mmol/L (ref 135–145)

## 2016-07-07 LAB — I-STAT TROPONIN, ED
TROPONIN I, POC: 0 ng/mL (ref 0.00–0.08)
Troponin i, poc: 0 ng/mL (ref 0.00–0.08)

## 2016-07-07 LAB — TROPONIN I: Troponin I: <0.03 ng/mL (ref <0.03)

## 2016-07-07 LAB — CBC
HCT: 44.9 % (ref 36.0–46.0)
Hemoglobin: 15.5 g/dL — ABNORMAL HIGH (ref 12.0–15.0)
MCH: 31.7 pg (ref 26.0–34.0)
MCHC: 34.5 g/dL (ref 30.0–36.0)
MCV: 91.8 fL (ref 78.0–100.0)
Platelets: 260 10*3/uL (ref 150–400)
RBC: 4.89 MIL/uL (ref 3.87–5.11)
RDW: 13.3 % (ref 11.5–15.5)
WBC: 7.8 10*3/uL (ref 4.0–10.5)

## 2016-07-07 LAB — D-DIMER, QUANTITATIVE (NOT AT ARMC)

## 2016-07-07 MED ORDER — LEVOTHYROXINE SODIUM 100 MCG PO TABS
100.0000 ug | ORAL_TABLET | Freq: Every day | ORAL | Status: DC
  Administered 2016-07-08: 100 ug via ORAL
  Filled 2016-07-07: qty 1

## 2016-07-07 MED ORDER — ACETAMINOPHEN 325 MG PO TABS: 650.0000 mg | ORAL_TABLET | ORAL | Status: DC | PRN

## 2016-07-07 MED ORDER — HYOSCYAMINE SULFATE 0.125 MG SL SUBL
0.1250 mg | SUBLINGUAL_TABLET | SUBLINGUAL | Status: DC | PRN
  Filled 2016-07-07: qty 1

## 2016-07-07 MED ORDER — OMEPRAZOLE 20 MG PO CPDR
20.0000 mg | DELAYED_RELEASE_CAPSULE | Freq: Every day | ORAL | Status: DC | PRN
  Filled 2016-07-07: qty 1

## 2016-07-07 MED ORDER — METOPROLOL TARTRATE 25 MG PO TABS
12.5000 mg | ORAL_TABLET | Freq: Two times a day (BID) | ORAL | Status: DC
  Filled 2016-07-07 (×2): qty 1

## 2016-07-07 MED ORDER — ONDANSETRON HCL 4 MG/2ML IJ SOLN: 4.0000 mg | Freq: Four times a day (QID) | INTRAMUSCULAR | Status: DC | PRN

## 2016-07-07 MED ORDER — OXYCODONE-ACETAMINOPHEN 5-325 MG PO TABS: 1.0000 | ORAL_TABLET | ORAL | Status: DC | PRN

## 2016-07-07 MED ORDER — METOCLOPRAMIDE HCL 5 MG/ML IJ SOLN
10.0000 mg | Freq: Once | INTRAMUSCULAR | Status: AC
  Administered 2016-07-07: 10 mg via INTRAVENOUS
  Filled 2016-07-07: qty 2

## 2016-07-07 MED ORDER — ASPIRIN 81 MG PO CHEW
324.0000 mg | CHEWABLE_TABLET | Freq: Once | ORAL | Status: AC
  Administered 2016-07-07: 324 mg via ORAL
  Filled 2016-07-07: qty 4

## 2016-07-07 MED ORDER — NITROGLYCERIN 0.4 MG SL SUBL: 0.4000 mg | SUBLINGUAL_TABLET | SUBLINGUAL | Status: DC | PRN

## 2016-07-07 MED ORDER — OMEPRAZOLE MAGNESIUM 20 MG PO TBEC: 20.0000 mg | DELAYED_RELEASE_TABLET | Freq: Every day | ORAL | Status: DC | PRN

## 2016-07-07 MED ORDER — SODIUM CHLORIDE 0.9 % IV BOLUS (SEPSIS)
1000.0000 mL | Freq: Once | INTRAVENOUS | Status: AC
  Administered 2016-07-07: 1000 mL via INTRAVENOUS

## 2016-07-07 MED ORDER — ASPIRIN EC 81 MG PO TBEC
81.0000 mg | DELAYED_RELEASE_TABLET | Freq: Every day | ORAL | Status: DC
  Filled 2016-07-07: qty 1

## 2016-07-07 MED ORDER — NITROGLYCERIN 0.4 MG SL SUBL
0.4000 mg | SUBLINGUAL_TABLET | SUBLINGUAL | Status: DC | PRN
  Administered 2016-07-07 (×2): 0.4 mg via SUBLINGUAL
  Filled 2016-07-07 (×2): qty 1

## 2016-07-07 MED ORDER — GI COCKTAIL ~~LOC~~
30.0000 mL | Freq: Once | ORAL | Status: AC
  Administered 2016-07-07: 30 mL via ORAL
  Filled 2016-07-07: qty 30

## 2016-07-07 MED ORDER — ENOXAPARIN SODIUM 40 MG/0.4ML ~~LOC~~ SOLN: 40.0000 mg | SUBCUTANEOUS | Status: DC

## 2016-07-07 NOTE — ED Notes (Signed)
RN starting IV 

## 2016-07-07 NOTE — ED Notes (Signed)
Patient uncomfortable in bed, stating her back hurts the way she is laying.  Adjusted patient in bed, used blankets (because we have no pillows) to put under her legs.  Patient still unhappy and uncomfortable, stating, "I came here because I feel bad and you people are making me feel worse."  Patient asked tech and I to leave her room.

## 2016-07-07 NOTE — H&P (Signed)
[facility] History and Physical  Marilyn Rivas [id number] DOB: September 24, 1940 DOA: 07/07/2016  Referring physician: ED PCP: Irven Shelling, MD  Specialists: none yet  Chief Complaint: CP  HPI:   36 ? L knee replacement 12/2015 foll by Cardiology Dr. Meda Coffee for DOE- Prior abn stress NEg cath 06/17/14 Underlying cls III dyspnea foll by pulmonary Quit 36 pck yr H/o 1985 Prior l5-S1 disckektomy Jerrye Bushy /dysphagia and diverticular disease foll by Dr. Angie Fava hiatal hernia on EGD 2003  2-3 day h/o intermittent central chest discomfort, Some nausea and anorexia as well Constant, "pressure like" No radiation Ranging 6-8/10 No DOW, PND or signig LE swelling Tried otc meds and prilosec, no relief really  called PMD and advised to come to Hospital  Daughter recently ill with viral GE  NO dysuria, ha, dysuria, cough, cold, fever, chill, rash, falls unilat weakness   Ed wortk up revealed wnl labs, CXr wnl D dimer low Rand glucose 246 poc troponin neg  Given Nitro x 2 in Ed which seemed to help pain   Review of Systems:   Past Medical History:  Diagnosis Date  . Anxiety   . Arthritis    "left; maybe back" (01/13/2016)  . Cancer ([place])    skin pre-cancer  . Depression   . Diverticulitis   . Diverticulosis   . Family history of adverse reaction to anesthesia    "daughter gets really nauseous & has trouble getting intubated" (01/13/2016)  . GERD (gastroesophageal reflux disease)    HISTORY  . History of blood transfusion 1977   "related to OR"  . Hx of adenomatous colonic polyps   . Hyperlipidemia   . Hypothyroidism   . Lichen sclerosus [id number]   Biopsy proven  . Menopause   . Osteopenia   . Pneumonia 1990s X 1; early 2000s X 1  . Seasonal allergies   . Thyroid disease    Past Surgical History:  Procedure Laterality Date  . ACHILLES TENDON SURGERY Right 04/25/2016   Procedure: ACHILLES TENDON REPAIR,PARTIAL EXCISION CALCANEOUS;  Surgeon: [name], MD;  Location: [facility];  Service: Orthopedics;  Laterality: Right;  . APPENDECTOMY  1977  . BACK SURGERY    . BLEPHAROPLASTY Bilateral   . CARDIAC CATHETERIZATION  ~ 2014  . CARPAL TUNNEL RELEASE Right   . CATARACT EXTRACTION W/ INTRAOCULAR LENS  IMPLANT, BILATERAL Bilateral   . CERVICAL FUSION  1999   C5-7  . DILATATION & CURETTAGE/HYSTEROSCOPY WITH TRUECLEAR N/A 05/02/2013   Procedure: DILATATION & CURETTAGE/HYSTEROSCOPY WITH TRUECLEAR ;  Surgeon: Anastasio Auerbach, MD;  Location: [facility];  Service: Gynecology;  Laterality: N/A;  . DILATION AND CURETTAGE OF UTERUS    . HARDWARE REMOVAL Left 12/12/2013   Procedure:  LEFT PATELLA HARDWARE REMOVAL;  Surgeon: [name], MD;  Location: [facility];  Service: Orthopedics;  Laterality: Left;  . HYSTEROSCOPY    . JOINT REPLACEMENT    . KNEE ARTHROSCOPY Left 12/12/2013   Procedure: LEFT PATELLA ARTHROSCOPY KNEE WITH DEBRIDEMENT/SHAVING (CONDROPLASTY), LYSIS OF ADHESIONS;  Surgeon: [name], MD;  Location: [facility];  Service: Orthopedics;  Laterality: Left;  . LEFT HEART CATHETERIZATION WITH CORONARY ANGIOGRAM N/A 04/22/2014   Procedure: LEFT HEART CATHETERIZATION WITH CORONARY ANGIOGRAM;  Surgeon: Sinclair Grooms, MD;  Location[facility];  Service: Cardiovascular;  Laterality: N/A;  . LUMBAR [place] SURGERY  2004   L5  . PARTIAL KNEE ARTHROPLASTY Left 01/13/2016   Procedure: LEFT  UNICOMPARTMENTAL KNEE;  Surgeon: [name], MD;  Location: [place];  Service: Orthopedics;  Laterality: Left;  . PATELLA RECONSTRUCTION Left 1994  . REPLACEMENT UNICONDYLAR JOINT KNEE Left 01/13/2016  . RIGHT OOPHORECTOMY  1977  . TONSILLECTOMY     Social History:  Social History   Social History Narrative   Daily caffeine     Allergies  Allergen Reactions  . Aleve [Naproxen Sodium] Other (See Comments)    Severe abdominal pain  . Aspirin Other (See Comments)    severe abdominal pain and  excessive salavation   . Atorvastatin Other (See Comments)    myalgia  . Crestor [Rosuvastatin] Other (See Comments)    Muscle aches  . Durezol [Difluprednate] Other (See Comments)    Eye redness    . Other Other (See Comments)    P[place]o eye drops - causes eye redness  . Statins Other (See Comments)    Muscle cramps  . Adhesive [Tape] Rash and Other (See Comments)    Looks burned  . Clarithromycin Rash and Other (See Comments)    Says allergic to "mycins"  . Penicillins Swelling, Rash and Other (See Comments)    Hands swelling Has patient had a PCN reaction causing immediate rash, facial/tongue/throat swelling, SOB or lightheadedness with hypotension: yes Has patient had a PCN reaction causing severe rash involving mucus membranes or skin necrosis: no Has patient had a PCN reaction that required hospitalization no Has patient had a PCN reaction occurring within the last 10 years: no If all of the above answers are "NO", then may proceed with Cephalosporin use.    Marland Kitchen Zofran [Ondansetron Hcl] Other (See Comments)    Headache     Family History  Problem Relation Age of Onset  . Allergies Mother   . Heart disease Father   . Colon cancer Neg Hx     Prior to Admission medications   Medication Sig Start Date End Date Taking? Authorizing Provider  acetaminophen (TYLENOL) 500 MG tablet Take 1,000 mg by mouth every morning.   Yes Historical Provider, MD  Biotin w/ Vitamins C & E (HAIR/SKIN/NAILS PO) Take 1 tablet by mouth daily.   Yes Historical Provider, MD  Calcium Citrate-Vitamin D (CALCIUM CITRATE + PO) Take 3 tablets by mouth daily. Contain 800mg  calcium citrate and other vitamins D,C, K, Mg, zinc, copper   Yes Historical Provider, MD  Cholecalciferol (VITAMIN D3) 5000 UNITS TABS Take 1 tablet by mouth every Monday, Wednesday, and Friday.    Yes Historical Provider, MD  clobetasol cream (TEMOVATE) [id number] % Apply 1 application topically 2 (two) times daily. Patient taking  differently: Apply 1 application topically 2 (two) times daily as needed.  11/18/15  Yes Anastasio Auerbach, MD  diphenhydramine-acetaminophen (TYLENOL PM) 25-500 MG TABS tablet Take 1-2 tablets by mouth at bedtime as needed (For pain or sleep.).   Yes Historical Provider, MD  hyoscyamine (LEVSIN/SL) 0.125 MG SL tablet Place 1 tablet (0.125 mg total) under the tongue every 4 (four) hours as needed. 07/04/14  Yes Willia Craze, NP  levothyroxine (SYNTHROID, LEVOTHROID) 112 MCG tablet Take 1 tablet (112 mcg total) by mouth daily. USES NAME BRAND ONLY Patient taking differently: Take 100 mcg by mouth daily. USES NAME BRAND ONLY 11/17/14  Yes Lanice Shirts, MD  omeprazole (PRILOSEC OTC) 20 MG tablet Take 20 mg by mouth daily as needed.    Yes Historical Provider, MD  oxyCODONE-acetaminophen (PERCOCET) 5-325 MG tablet Take 1-2 tablets by mouth every 4 (four)  hours as needed for severe pain. 04/25/16  Yes Aundra Dubin, PA-C  Probiotic Product (PROBIOTIC PO) Take 1-2 capsules by mouth as needed (for GI support).    Yes Historical Provider, MD  TURMERIC PO Take 1 capsule by mouth daily.   Yes Historical Provider, MD  OVER THE COUNTER MEDICATION Take 2 capsules by mouth 2 (two) times daily. [place]    Historical Provider, MD   Physical Exam: Vitals:   07/07/16 0923 07/07/16 0925 07/07/16 1031  BP:  117/75 124/70  Pulse:  98 84  Resp:  18 18  Temp:  98 F (36.7 C)   TempSrc:  Oral   SpO2:  96% 99%  Weight: 72.1 kg (159 lb)    Height: 5\' 1"  (1.549 m)      eomi flat afffect Thick neck, no jvd, bruit cta b, no addedn sound s1 s2 no m/r/g No epig tenderness, no organomegally, non distended, no rebound presure over chest wall doesn't reporduce her pain ROM intact Moves 4 limbbs equally Smile intact Power 5/5 Sensory gorssly intact  Labs on Admission:  Basic Metabolic Panel:  Recent Labs Lab 07/07/16 1005  NA 137  K 3.6  CL 105  CO2 24  GLUCOSE 246*  BUN 8  CREATININE 0.76   CALCIUM 9.6   Liver Function Tests: No results for input(s): AST, ALT, ALKPHOS, BILITOT, PROT, ALBUMIN in the last 168 hours. No results for input(s): LIPASE, AMYLASE in the last 168 hours. No results for input(s): AMMONIA in the last 168 hours. CBC:  Recent Labs Lab 07/07/16 1005  WBC 7.8  HGB 15.5*  HCT 44.9  MCV 91.8  PLT 260   Cardiac Enzymes: No results for input(s): CKTOTAL, CKMB, CKMBINDEX, TROPONINI in the last 168 hours.  BNP (last 3 results) No results for input(s): BNP in the last 8760 hours.  ProBNP (last 3 results) No results for input(s): PROBNP in the last 8760 hours.  CBG: No results for input(s): GLUCAP in the last 168 hours.  Radiological Exams on Admission: Dg Chest 2 View  Result Date: 07/07/2016 CLINICAL DATA:  Mid chest pain and heaviness for the past 3 days. History of hyperlipidemia, former smoker. EXAM: CHEST  2 VIEW COMPARISON:  PA and lateral chest x-ray of February 19, 2014 FINDINGS: The lungs are adequately inflated. There is no focal infiltrate. There is stable scarring in the right middle lobe and just above the right lateral costophrenic angle. The heart and pulmonary vascularity are normal. There is calcification in the wall of the aortic arch. There is no pleural effusion or pneumothorax. The bony thorax exhibits no acute abnormality. IMPRESSION: Chronic interstitial changes likely reflecting scarring in the right middle lobe. No acute pneumonia nor other acute cardiopulmonary abnormality. Aortic atherosclerosis. Electronically Signed   By: David  Martinique M.D.   On: 07/07/2016 09:45    EKG: Independently reviewed. St-t depressions across precordium Pr 0.08, qrs axis 45  Compare\d with prior 12/28/15 aboutt he same  Assessment/Plan   R/O ACS Cycle troponin, AM EKG Alternate diagnosis like GERD is plausible, especially with anorexia and nausea .  If troponin elevated and or continued pain, would consider involving Cardiology- Heart score about  4, but I feel without crescendo-decrescendo findings and constant nature, is more explained by alternat diagnosis and had a long discussion with patient about this See if GI cocktail helps-she is also on Levsin which might point to a more GI cause Will continue ASA 81 daily-received asa 325 in ED  Elevated  CBG- No prior diag DM Get HbA1c Might need DM education if sugars remain above 180  GERD/Hiatal hernia/Diverticular dx -consider Carafate.  Will see how she does on GI coctail and might benefit from Carafate Change Omeprazole to Protonix 40 daily    Lovenox Full code Obs on tele    Time spent: [place], [facility] Pager [phone]  If 7PM-7AM, please contact night-coverage [email or web address] Password [email or web address] 07/07/2016, 11:32 AM

## 2016-07-07 NOTE — ED Provider Notes (Signed)
[facility] DEPT Provider Note   CSN: [id number] Arrival date & time: 07/07/16  0911     History   Chief Complaint Chief Complaint  Patient presents with  . Chest Pain    HPI Marilyn Rivas is a 75 y.o. female with a past medical history significant for anxiety, depression, diverticulitis, hyperlipidemia, and GERD who presents with chest pain and fatigue. Patient reports that for the last 3 days, she has had gradually worsening symptoms. She describes a pressure-like chest pain in her central chest that radiates into the left chest. She says it has been constant and at its worst is an 8 out of 10 severity. She reports is currently a 6 out of 10. She describes some palpitations, fatigue, nausea, and lightheadedness. She denies diaphoresis, syncope, shortness of breath, and vomiting. She denies any recent chest trauma. She has a remote history of smoking and has a family history of heart disease. Patient denies any fevers, chills, rhinorrhea, congestion, or cough. Of note, patient does report having multiple leg surgeries this year in March and August. Patient reports that she is still in rehabilitation but is able to ambulate. Patient denies any history of DVT or PE. She is not currently on anticoagulation. She reports a slightly decreased by mouth intake.  The history is provided by the patient and medical records. No language interpreter was used.  Chest Pain   This is a new problem. The current episode started more than 2 days ago. The problem occurs constantly. The problem has been gradually worsening. The pain is present in the substernal region. The pain is at a severity of 8/10. The pain is moderate. The quality of the pain is described as dull and pressure-like. Associated symptoms include palpitations. Pertinent negatives include no abdominal pain, no back pain, no cough, no diaphoresis, no dizziness, no fever, no headaches, no nausea, no numbness, no shortness of breath, no  vomiting and no weakness. She has tried nothing for the symptoms. The treatment provided no relief.    Past Medical History:  Diagnosis Date  . Anxiety   . Arthritis    "left; maybe back" (01/13/2016)  . Cancer ([place])    skin pre-cancer  . Depression   . Diverticulitis   . Diverticulosis   . Family history of adverse reaction to anesthesia    "daughter gets really nauseous & has trouble getting intubated" (01/13/2016)  . GERD (gastroesophageal reflux disease)    HISTORY  . History of blood transfusion 1977   "related to OR"  . Hx of adenomatous colonic polyps   . Hyperlipidemia   . Hypothyroidism   . Lichen sclerosus [id number]   Biopsy proven  . Menopause   . Osteopenia   . Pneumonia 1990s X 1; early 2000s X 1  . Seasonal allergies   . Thyroid disease     Patient Active Problem List   Diagnosis Date Noted  . Status post left partial knee replacement 01/13/2016  . Abdominal pain, epigastric 07/07/2014  . Fatigue 04/08/2014  . Dyspnea on exertion 04/08/2014  . Hyperlipidemia 04/08/2014  . Abdominal pain, left lower quadrant 05/20/2013  . Nausea alone 03/06/2013  . LLQ pain 03/05/2013  . Hyperglycemia 10/29/2012  . Urge incontinence of urine 10/29/2012  . Osteoporosis 05/13/2012  . Basal cell cancer 12/17/2011  . Sinus bradycardia by electrocardiogram 11/15/2011  . Hypothyroidism 09/07/2011  . DJD (degenerative joint disease) 09/07/2011  . Atrophic vaginitis 09/07/2011  . Radiculopathy of lumbar region 09/07/2011  . GERD 09/08/2010  .  DIVERTICULITIS, COLON 09/08/2010  . CONSTIPATION 10/09/2009  . PERSONAL HX COLONIC POLYPS 10/09/2009  . Mixed hyperlipidemia 04/10/2009    Past Surgical History:  Procedure Laterality Date  . ACHILLES TENDON SURGERY Right 04/25/2016   Procedure: ACHILLES TENDON REPAIR,PARTIAL EXCISION CALCANEOUS;  Surgeon: [name], MD;  Location: [facility];  Service: Orthopedics;  Laterality: Right;  . APPENDECTOMY  1977  .  BACK SURGERY    . BLEPHAROPLASTY Bilateral   . CARDIAC CATHETERIZATION  ~ 2014  . CARPAL TUNNEL RELEASE Right   . CATARACT EXTRACTION W/ INTRAOCULAR LENS  IMPLANT, BILATERAL Bilateral   . CERVICAL FUSION  1999   C5-7  . DILATATION & CURETTAGE/HYSTEROSCOPY WITH TRUECLEAR N/A 05/02/2013   Procedure: DILATATION & CURETTAGE/HYSTEROSCOPY WITH TRUECLEAR ;  Surgeon: Anastasio Auerbach, MD;  Location: [facility];  Service: Gynecology;  Laterality: N/A;  . DILATION AND CURETTAGE OF UTERUS    . HARDWARE REMOVAL Left 12/12/2013   Procedure:  LEFT PATELLA HARDWARE REMOVAL;  Surgeon: [name], MD;  Location: [facility];  Service: Orthopedics;  Laterality: Left;  . HYSTEROSCOPY    . JOINT REPLACEMENT    . KNEE ARTHROSCOPY Left 12/12/2013   Procedure: LEFT PATELLA ARTHROSCOPY KNEE WITH DEBRIDEMENT/SHAVING (CONDROPLASTY), LYSIS OF ADHESIONS;  Surgeon: [name], MD;  Location: [facility];  Service: Orthopedics;  Laterality: Left;  . LEFT HEART CATHETERIZATION WITH CORONARY ANGIOGRAM N/A 04/22/2014   Procedure: LEFT HEART CATHETERIZATION WITH CORONARY ANGIOGRAM;  Surgeon: Sinclair Grooms, MD;  Location: [facility];  Service: Cardiovascular;  Laterality: N/A;  . LUMBAR [place] SURGERY  2004   L5  . PARTIAL KNEE ARTHROPLASTY Left 01/13/2016   Procedure: LEFT UNICOMPARTMENTAL KNEE;  Surgeon: [name], MD;  Location: [facility];  Service: Orthopedics;  Laterality: Left;  . PATELLA RECONSTRUCTION Left 1994  . REPLACEMENT UNICONDYLAR JOINT KNEE Left 01/13/2016  . RIGHT OOPHORECTOMY  1977  . TONSILLECTOMY      OB History    No data available       Home Medications    Prior to Admission medications   Medication Sig Start Date End Date Taking? Authorizing Provider  Calcium Citrate-Vitamin D (CALCIUM CITRATE + PO) Take 2 tablets by mouth 2 (two) times daily. Contain 800mg  calcium citrate and other vitamins D,C, K, Mg, zinc, copper    Historical Provider, MD    Cholecalciferol (VITAMIN D3) 5000 UNITS TABS Take 1 tablet by mouth daily.    Historical Provider, MD  clobetasol cream (TEMOVATE) [id number] % Apply 1 application topically 2 (two) times daily. Patient taking differently: Apply 1 application topically 2 (two) times daily as needed.  11/18/15   Anastasio Auerbach, MD  hyoscyamine (LEVSIN/SL) 0.125 MG SL tablet Place 1 tablet (0.125 mg total) under the tongue every 4 (four) hours as needed. 07/04/14   Willia Craze, NP  levothyroxine (SYNTHROID, LEVOTHROID) 112 MCG tablet Take 1 tablet (112 mcg total) by mouth daily. USES NAME BRAND ONLY Patient taking differently: Take 100 mcg by mouth daily. USES NAME BRAND ONLY 11/17/14   Lanice Shirts, MD  omeprazole (PRILOSEC OTC) 20 MG tablet Take 20 mg by mouth daily as needed.     Historical Provider, MD  OVER THE COUNTER MEDICATION Take 2 capsules by mouth 2 (two) times daily. [place]    Historical Provider, MD  oxyCODONE-acetaminophen (PERCOCET) 5-325 MG tablet Take 1-2 tablets by mouth every 4 (four) hours as needed for severe pain. 04/25/16  Aundra Dubin, PA-C  Probiotic Product (PROBIOTIC PO) Take 1-2 capsules by mouth as needed (for GI support).     Historical Provider, MD    Family History Family History  Problem Relation Age of Onset  . Allergies Mother   . Heart disease Father   . Colon cancer Neg Hx     Social History Social History  Substance Use Topics  . Smoking status: Former Smoker    Packs/day: 1.00    Years: 27.00    Types: Cigarettes    Quit date: 12/10/1983  . Smokeless tobacco: Never Used  . Alcohol use Yes     Comment: 01/13/2016 "I'll have a drink < 2 times/month"     Allergies   Aleve [naproxen sodium]; Aspirin; Atorvastatin; Crestor [rosuvastatin]; Durezol [difluprednate]; Other; Adhesive [tape]; Clarithromycin; Penicillins; and Zofran [ondansetron hcl]   Review of Systems Review of Systems  Constitutional: Negative for activity change, appetite change,  chills, diaphoresis, fatigue and fever.  HENT: Negative for congestion and rhinorrhea.   Eyes: Negative for visual disturbance.  Respiratory: Negative for cough, chest tightness, shortness of breath and stridor.   Cardiovascular: Positive for chest pain and palpitations. Negative for leg swelling.  Gastrointestinal: Negative for abdominal distention, abdominal pain, constipation, diarrhea, nausea and vomiting.  Genitourinary: Negative for difficulty urinating, dysuria, flank pain, frequency, hematuria, menstrual problem, pelvic pain, vaginal bleeding and vaginal discharge.  Musculoskeletal: Negative for back pain and neck pain.  Skin: Negative for rash and wound.  Neurological: Negative for dizziness, weakness, light-headedness, numbness and headaches.  Psychiatric/Behavioral: Negative for agitation and confusion.  All other systems reviewed and are negative.    Physical Exam Updated Vital Signs BP 117/75 (BP Location: Left Arm)   Pulse 98   Temp 98 F (36.7 C) (Oral)   Resp 18   Ht 5\' 1"  (1.549 m)   Wt 159 lb (72.1 kg)   SpO2 96%   BMI 30.04 kg/m   Physical Exam  Constitutional: She is oriented to person, place, and time. She appears well-developed and well-nourished. No distress.  HENT:  Head: Normocephalic and atraumatic.  Mouth/Throat: Oropharynx is clear and moist. No oropharyngeal exudate.  Eyes: Conjunctivae and EOM are normal. Pupils are equal, round, and reactive to light.  Neck: Normal range of motion. Neck supple.  Cardiovascular: Normal rate and regular rhythm.   No murmur heard. Pulmonary/Chest: Effort normal and breath sounds normal. No stridor. No respiratory distress. She has no wheezes. She exhibits no tenderness.  Abdominal: Soft. There is no tenderness.  Musculoskeletal: She exhibits no edema or tenderness.  Neurological: She is alert and oriented to person, place, and time.  Skin: Skin is warm and dry. Capillary refill takes less than 2 seconds.    Psychiatric: She has a normal mood and affect.  Nursing note and vitals reviewed.    ED Treatments / Results  Labs (all labs ordered are listed, but only abnormal results are displayed) Labs Reviewed  BASIC METABOLIC PANEL - Abnormal; Notable for the following:       Result Value   Glucose, Bld 246 (*)    All other components within normal limits  CBC - Abnormal; Notable for the following:    Hemoglobin 15.5 (*)    All other components within normal limits  D-DIMER, QUANTITATIVE (NOT AT [facility])  TROPONIN I  TROPONIN I  TROPONIN I  BASIC METABOLIC PANEL  I-STAT TROPOININ, ED  I-STAT TROPOININ, ED  I-STAT TROPOININ, ED    EKG  EKG Interpretation  Date/Time:  Thursday July 07 2016 09:19:37 EST Ventricular Rate:  99 PR Interval:    QRS Duration: 78 QT Interval:  397 QTC Calculation: 510 R Axis:   49 Text Interpretation:  Sinus rhythm Borderline repol abnormality, diffuse leads Prolonged QT interval ST abnormalities appear similar to prior.  Abnormal ECG No STEMI Confirmed by Sherry Ruffing MD, CHRISTOPHER [phone]) on 07/07/2016 9:31:08 AM Also confirmed by Sherry Ruffing MD, [place] [phone]), editor [place], Leda Gauze [phone])  on 07/07/2016 10:50:39 AM       Radiology Dg Chest 2 View  Result Date: 07/07/2016 CLINICAL DATA:  Mid chest pain and heaviness for the past 3 days. History of hyperlipidemia, former smoker. EXAM: CHEST  2 VIEW COMPARISON:  PA and lateral chest x-ray of February 19, 2014 FINDINGS: The lungs are adequately inflated. There is no focal infiltrate. There is stable scarring in the right middle lobe and just above the right lateral costophrenic angle. The heart and pulmonary vascularity are normal. There is calcification in the wall of the aortic arch. There is no pleural effusion or pneumothorax. The bony thorax exhibits no acute abnormality. IMPRESSION: Chronic interstitial changes likely reflecting scarring in the right middle lobe. No acute pneumonia nor other acute  cardiopulmonary abnormality. Aortic atherosclerosis. Electronically Signed   By: David  Martinique M.D.   On: 07/07/2016 09:45    Procedures Procedures (including critical care time)  Medications Ordered in ED Medications  nitroGLYCERIN (NITROSTAT) SL tablet 0.4 mg (0.4 mg Sublingual Given 07/07/16 1055)  levothyroxine (SYNTHROID, LEVOTHROID) tablet 100 mcg (not administered)  oxyCODONE-acetaminophen (PERCOCET/ROXICET) 5-325 MG per tablet 1-2 tablet (not administered)  hyoscyamine (LEVSIN SL) SL tablet 0.125 mg (not administered)  aspirin EC tablet 81 mg (not administered)  nitroGLYCERIN (NITROSTAT) SL tablet 0.4 mg (not administered)  acetaminophen (TYLENOL) tablet 650 mg (not administered)  ondansetron (ZOFRAN) injection 4 mg (not administered)  enoxaparin (LOVENOX) injection 40 mg (not administered)  metoprolol tartrate (LOPRESSOR) tablet 12.5 mg (not administered)  omeprazole (PRILOSEC) capsule 20 mg (not administered)  aspirin chewable tablet 324 mg (324 mg Oral Given 07/07/16 1015)  sodium chloride 0.9 % bolus 1,000 mL (0 mLs Intravenous Stopped 07/07/16 1253)  metoCLOPramide (REGLAN) injection 10 mg (10 mg Intravenous Given 07/07/16 1019)  gi cocktail (Maalox,Lidocaine,Donnatal) (30 mLs Oral Given 07/07/16 1255)     Initial Impression / Assessment and Plan / ED Course  I have reviewed the triage vital signs and the nursing notes.  Pertinent labs & imaging results that were available during my care of the patient were reviewed by me and considered in my medical decision making (see chart for details).  Clinical Course     Marilyn Rivas is a 75 y.o. female with a past medical history significant for anxiety, depression, diverticulitis, hyperlipidemia, and GERD who presents with chest pain and fatigue.  History and exam are seen above.  Initial EKG was performed and showed prolonged QTC and some ST abnormalities. When compared to prior, they do appear similar. She denies any  history of this chest pain.  On exam, patient had no leg pain, leg swelling, lungs were clear, and abdomen was nontender. Chest was nontender. No significant edema in extremities. Neurologic exam unremarkable. Pulses symmetric.  Patient will be given aspirin and nitroglycerin for her chest pain. Patient will have laboratory workup, imaging.   Heart score calculated as a 5 for the patient.   Initial laboratory testing returned showing negative troponin and negative D-dimer. Laboratory testing showed no significant vallecular abnormalities however there was  hyperglycemia. Normal kidney function. No leukocytosis. No anemia.  Chest x-ray showed evidence of scarring but no edema or pneumonia.  Patient reported some improvement in chest pain after nitroglycerin. Pain went from a 7 to a 4 out of 10. Patient will be given GI cocktail to try and provide further relief.  Given patient's elevated heart score, and continue chest pain, patient will be admitted for further management and workup of her chest pain.    Final Clinical Impressions(s) / ED Diagnoses   Final diagnoses:  Chest pain, unspecified type    Clinical Impression: 1. Chest pain, unspecified type     Disposition: Admit to [place], MD 07/07/16 2052

## 2016-07-07 NOTE — ED Triage Notes (Signed)
Pt with chest pain x 3 days. Pt states feels heavy.  No cough/congestion.  Pt has hx of reflux.  No shortness of breath.  No change in activity.

## 2016-07-08 DIAGNOSIS — K5790 Diverticulosis of intestine, part unspecified, without perforation or abscess without bleeding: Secondary | ICD-10-CM | POA: Diagnosis not present

## 2016-07-08 DIAGNOSIS — K449 Diaphragmatic hernia without obstruction or gangrene: Secondary | ICD-10-CM | POA: Diagnosis not present

## 2016-07-08 DIAGNOSIS — R0789 Other chest pain: Secondary | ICD-10-CM | POA: Diagnosis not present

## 2016-07-08 DIAGNOSIS — K219 Gastro-esophageal reflux disease without esophagitis: Secondary | ICD-10-CM

## 2016-07-08 DIAGNOSIS — E039 Hypothyroidism, unspecified: Secondary | ICD-10-CM | POA: Diagnosis not present

## 2016-07-08 DIAGNOSIS — L7632 Postprocedural hematoma of skin and subcutaneous tissue following other procedure: Secondary | ICD-10-CM | POA: Diagnosis not present

## 2016-07-08 DIAGNOSIS — Z87891 Personal history of nicotine dependence: Secondary | ICD-10-CM | POA: Diagnosis not present

## 2016-07-08 DIAGNOSIS — R739 Hyperglycemia, unspecified: Secondary | ICD-10-CM | POA: Diagnosis not present

## 2016-07-08 DIAGNOSIS — R072 Precordial pain: Secondary | ICD-10-CM | POA: Diagnosis not present

## 2016-07-08 LAB — BASIC METABOLIC PANEL
ANION GAP: 6 (ref 5–15)
BUN: 9 mg/dL (ref 6–20)
CALCIUM: 9.4 mg/dL (ref 8.9–10.3)
CHLORIDE: 110 mmol/L (ref 101–111)
CO2: 26 mmol/L (ref 22–32)
Creatinine, Ser: 0.57 mg/dL (ref 0.44–1.00)
GFR calc non Af Amer: >60 mL/min (ref >60)
GLUCOSE: 104 mg/dL — AB (ref 65–99)
POTASSIUM: 4 mmol/L (ref 3.5–5.1)
Sodium: 142 mmol/L (ref 135–145)

## 2016-07-08 LAB — TROPONIN I: Troponin I: <0.03 ng/mL (ref <0.03)

## 2016-07-08 MED ORDER — NITROGLYCERIN 0.4 MG SL SUBL: 0.4000 mg | SUBLINGUAL_TABLET | SUBLINGUAL | 0 refills | Status: DC | PRN

## 2016-07-08 NOTE — Consult Note (Signed)
Cardiology Consult    Patient ID: Marilyn Rivas MRN: [id number], DOB/AGE: 1940/10/26   Admit date: 07/07/2016 Date of Consult: 07/08/2016  Primary Physician: Irven Shelling, MD Reason for Consult: Chest Pain Primary Cardiologist: Dr. Meda Coffee Requesting Provider: Dr. Alfredia Ferguson   History of Present Illness    Marilyn Rivas is a 75 y.o. female with past medical history of HTN, HLD, GERD and normal cors (by cath in 03/2014) who presented to [facility] ED on 07/07/2016 for evaluation of chest pain.   Reports developing chest pain 3 days ago. Reports this was constant in nature, present with rest and with exertion, and describes it as a tightness along her sternum. Denies any associated dyspnea, nausea, vomiting, or diaphoresis. Not worse with positional changes. She took antacids with no relief. She called her PCP for further advice who advised she come to the emergency room.  Initial labs showed a WBC of 7.8, Hgb 15.5, platelets 260. K+ 3.6. Creatinine 0.76. Negative d-dimer. Cyclic troponin values have been negative. CXR shows chronic interstitial changes likely reflecting scarring in the right middle lobe with no acute pneumonia nor other acute cardiopulmonary abnormality. EKG shows NSR, HR 72 with diffuse TWI in the anterior and lateral leads (similar to previous tracings).   She was given SL nitroglycerin while in the ED with improvement in her pain. Denies any repeat episodes of chest pain since being admitted. Reports feeling well this morning besides being agitated about still being in the hospital. She is very anxious to leave the hospital as she reports having an awful experience. Sites instances of being in the emergency room for over 8 hours and being uncomfortable on the stretcher, not getting her food for an extended period of time, and having nursing staff talk on the phone while in her room. She is ready to go home now.  Her last ischemic evaluation was a cardiac  catheterization in 03/2014 which showed normal coronary arteries with an EF of 60%. This was performed due to her having an exercise myoview and with her progressive dyspnea.  Past Medical History   Past Medical History:  Diagnosis Date  . Anxiety   . Arthritis    "left; maybe back" (01/13/2016)  . Cancer ([place])    skin pre-cancer  . Depression   . Diverticulitis   . Diverticulosis   . Family history of adverse reaction to anesthesia    "daughter gets really nauseous & has trouble getting intubated" (01/13/2016)  . GERD (gastroesophageal reflux disease)    HISTORY  . History of blood transfusion 1977   "related to OR"  . Hx of adenomatous colonic polyps   . Hyperlipidemia   . Hypothyroidism   . Lichen sclerosus [id number]   Biopsy proven  . Menopause   . Osteopenia   . Pneumonia 1990s X 1; early 2000s X 1  . Seasonal allergies   . Thyroid disease     Past Surgical History:  Procedure Laterality Date  . ACHILLES TENDON SURGERY Right 04/25/2016   Procedure: ACHILLES TENDON REPAIR,PARTIAL EXCISION CALCANEOUS;  Surgeon: [name], MD;  Location: [place];  Service: Orthopedics;  Laterality: Right;  . APPENDECTOMY  1977  . BACK SURGERY    . BLEPHAROPLASTY Bilateral   . CARDIAC CATHETERIZATION  ~ 2014  . CARPAL TUNNEL RELEASE Right   . CATARACT EXTRACTION W/ INTRAOCULAR LENS  IMPLANT, BILATERAL Bilateral   . CERVICAL FUSION  1999   C5-7  . DILATATION & CURETTAGE/HYSTEROSCOPY WITH TRUECLEAR  N/A 05/02/2013   Procedure: DILATATION & CURETTAGE/HYSTEROSCOPY WITH TRUECLEAR ;  Surgeon: Anastasio Auerbach, MD;  Location: [facility];  Service: Gynecology;  Laterality: N/A;  . DILATION AND CURETTAGE OF UTERUS    . HARDWARE REMOVAL Left 12/12/2013   Procedure:  LEFT PATELLA HARDWARE REMOVAL;  Surgeon: [name], MD;  Location: [place];  Service: Orthopedics;  Laterality: Left;  . HYSTEROSCOPY    . JOINT REPLACEMENT    . KNEE ARTHROSCOPY Left 12/12/2013     Procedure: LEFT PATELLA ARTHROSCOPY KNEE WITH DEBRIDEMENT/SHAVING (CONDROPLASTY), LYSIS OF ADHESIONS;  Surgeon: [name], MD;  Location: [place];  Service: Orthopedics;  Laterality: Left;  . LEFT HEART CATHETERIZATION WITH CORONARY ANGIOGRAM N/A 04/22/2014   Procedure: LEFT HEART CATHETERIZATION WITH CORONARY ANGIOGRAM;  Surgeon: Sinclair Grooms, MD;  Location: [facility] C[facility];  Service: Cardiovascular;  Laterality: N/A;  . LUMBAR [place] SURGERY  2004   L5  . PARTIAL KNEE ARTHROPLASTY Left 01/13/2016   Procedure: LEFT UNICOMPARTMENTAL KNEE;  Surgeon: [name], MD;  Location: [place];  Service: Orthopedics;  Laterality: Left;  . PATELLA RECONSTRUCTION Left 1994  . REPLACEMENT UNICONDYLAR JOINT KNEE Left 01/13/2016  . RIGHT OOPHORECTOMY  1977  . TONSILLECTOMY       Allergies  Allergies  Allergen Reactions  . Aleve [Naproxen Sodium] Other (See Comments)    Severe abdominal pain  . Aspirin Other (See Comments)    severe abdominal pain and excessive salavation   . Atorvastatin Other (See Comments)    myalgia  . Crestor [Rosuvastatin] Other (See Comments)    Muscle aches  . Durezol [Difluprednate] Other (See Comments)    Eye redness    . Other Other (See Comments)    Paseo eye drops - causes eye redness  . Statins Other (See Comments)    Muscle cramps  . Adhesive [Tape] Rash and Other (See Comments)    Looks burned  . Clarithromycin Rash and Other (See Comments)    Says allergic to "mycins"  . Penicillins Swelling, Rash and Other (See Comments)    Hands swelling Has patient had a PCN reaction causing immediate rash, facial/tongue/throat swelling, SOB or lightheadedness with hypotension: yes Has patient had a PCN reaction causing severe rash involving mucus membranes or skin necrosis: no Has patient had a PCN reaction that required hospitalization no Has patient had a PCN reaction occurring within the last 10 years: no If all of the above answers are  "NO", then may proceed with Cephalosporin use.    Marland Kitchen Zofran [Ondansetron Hcl] Other (See Comments)    Headache     Inpatient Medications    . aspirin EC  81 mg Oral Daily  . enoxaparin (LOVENOX) injection  40 mg Subcutaneous Q24H  . levothyroxine  100 mcg Oral QAC breakfast  . metoprolol tartrate  12.5 mg Oral BID    Family History    Family History  Problem Relation Age of Onset  . Allergies Mother   . Heart disease Father   . Colon cancer Neg Hx     Social History    Social History   Social History  . Marital status: Divorced    Spouse name: N/A  . Number of children: 2  . Years of education: N/A   Occupational History  . 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 Smoking status: Former Smoker    Packs/day: 1.00  Years: 27.00    Types: Cigarettes    Quit date: 12/10/1983  . Smokeless tobacco: Never Used  . Alcohol use Yes     Comment: 01/13/2016 "I'll have a drink < 2 times/month"  . Drug use: No  . Sexual activity: No   Other Topics Concern  . Not on file   Social History Narrative   Daily caffeine      Review of Systems    General:  No chills, fever, night sweats or weight changes.  Cardiovascular:  No dyspnea on exertion, edema, orthopnea, palpitations, paroxysmal nocturnal dyspnea. Positive for chest pain.  Dermatological: No rash, lesions/masses Respiratory: No cough, dyspnea Urologic: No hematuria, dysuria Abdominal:   No nausea, vomiting, diarrhea, bright red blood per rectum, melena, or hematemesis Neurologic:  No visual changes, wkns, changes in mental status. All other systems reviewed and are otherwise negative except as noted above.  Physical Exam    Blood pressure (!) 148/53, pulse 65, temperature 98 F (36.7 C), temperature source Oral, resp. rate 18, height 5\' 1"  (1.549 m), weight 165 lb 9.6 oz (75.1 kg), SpO2 98 %.  General: Well-appearing Caucasian female currently in NAD Psych: Normal  affect. Neuro: Alert and oriented X 3. Moves all extremities spontaneously. HEENT: Normal  Neck: Supple without bruits or JVD. Lungs:  Resp regular and unlabored, CTA without wheezing or rales. Heart: RRR no s3, s4, or murmurs. Abdomen: Soft, non-tender, non-distended, BS + x 4.  Extremities: No clubbing, cyanosis or edema. DP/PT/Radials 2+ and equal bilaterally.  Labs    Troponin [facility] Test)  Recent Labs  07/07/16 1254  TROPIPOC 0.00    Recent Labs  07/07/16 1724 07/07/16 2230 07/08/16 0500  TROPONINI <0.03 <0.03 <0.03   Lab Results  Component Value Date   WBC 7.8 07/07/2016   HGB 15.5 (H) 07/07/2016   HCT 44.9 07/07/2016   MCV 91.8 07/07/2016   PLT 260 07/07/2016    Recent Labs Lab 07/08/16 0500  NA 142  K 4.0  CL 110  CO2 26  BUN 9  CREATININE 0.57  CALCIUM 9.4  GLUCOSE 104*   Lab Results  Component Value Date   CHOL 183 07/15/2014   HDL 50.70 07/15/2014   LDLCALC 107 (H) 07/15/2014   TRIG 126.0 07/15/2014   Lab Results  Component Value Date   DDIMER <0.27 07/07/2016     Radiology Studies    Dg Chest 2 View  Result Date: 07/07/2016 CLINICAL DATA:  Mid chest pain and heaviness for the past 3 days. History of hyperlipidemia, former smoker. EXAM: CHEST  2 VIEW COMPARISON:  PA and lateral chest x-ray of February 19, 2014 FINDINGS: The lungs are adequately inflated. There is no focal infiltrate. There is stable scarring in the right middle lobe and just above the right lateral costophrenic angle. The heart and pulmonary vascularity are normal. There is calcification in the wall of the aortic arch. There is no pleural effusion or pneumothorax. The bony thorax exhibits no acute abnormality. IMPRESSION: Chronic interstitial changes likely reflecting scarring in the right middle lobe. No acute pneumonia nor other acute cardiopulmonary abnormality. Aortic atherosclerosis. Electronically Signed   By: David  Martinique M.D.   On: 07/07/2016 09:45    EKG &  Cardiac Imaging    EKG: NSR, HR 72 with diffuse TWI in the anterior and lateral leads (similar to previous tracings).   Echocardiogram: 03/2014 Study Conclusions  - Left ventricle: The cavity size was normal. Wall thickness was normal. Systolic function  was normal. The estimated ejection fraction was in the range of 60% to 65%. Doppler parameters are consistent with abnormal left ventricular relaxation (grade 1 diastolic dysfunction). - Aortic valve: There was mild regurgitation. - Left atrium: The atrium was mildly dilated.  Cardiac Catheterization: [id number] COMPLICATIONS:  None   HEMODYNAMICS:  Aortic pressure 135/75 mmHg; LV pressure 137/13 mmHg; LVEDP 16 mm mercury  ANGIOGRAPHIC DATA:   The left main coronary artery is normal.  The left anterior descending artery is normal. The distribution is to the apex but not transapical. A large branching diagonal arises proximally and is also widely patent..  The left circumflex artery is widely patent with 2 moderate sized obtuse marginal branches..  The right coronary artery is dominant and normal.  LEFT VENTRICULOGRAM:  Left ventricular angiogram was done in the 30 RAO projection and revealed normal cavity size and EF 60%.  IMPRESSIONS:  1. Normal coronary arteries 2. Normal left ventricular systolic function with EF 60% and normal hemodynamics 3. Dyspnea not likely related to cardiac etiology  RECOMMENDATION:  Management per primary team with other considerations being pulmonary sources of dyspnea, deconditioning, and sleep apnea..  Assessment & Plan    1. Atypical Chest Pain - presented with 3 days of constant chest discomfort, present at rest and with exertion. Denies any associated dyspnea, nausea, vomiting, or diaphoresis. No association with positional changes. She took antacids with no relief. Pain was improved with SL NTG while in the ED. Denies any current chest pain. - cyclic troponin values have been  negative. EKG shows NSR, HR 72 with diffuse TWI in the anterior and lateral leads (similar to previous tracings).  - last ischemic evaluation was a cardiac catheterization in 03/2014 which showed normal coronary arteries with an EF of 60%.  - the patient is very anxious to leave the hospital for reasons listed above. Will discuss with Dr. Tamala Julian, but would anticipate a repeat nuclear stress test as an outpatient with her atypical symptoms, normal cors by cath in 2015, and negative cardiac enzymes.   2. HTN - BP well-controlled at 102/50 - 148/70 in the past 24 hours. - patient denies a history of this. Not on any antihypertensive medications prior to admission.   3. HLD - followed by PCP. Previously on Crestor and Atorvastatin but unable to tolerate secondary to myalgias.    Arna Medici, PA-C 07/08/2016, 9:47 AM Pager: [phone]  The patient has been seen in conjunction with Bernerd Pho, PA-C. All aspects of care have been considered and discussed. The patient has been personally interviewed, examined, and all clinical data has been reviewed.   Continuous 48 hour duration chest pressure relieved after GI cocktail and nitroglycerin.  Normal coronary angiography 2015 following a false positive exercise treadmill test.  Chronically abnormal EKG with repolarization abnormality unchanged from prior. Multiple cardiac markers negative.  Prolonged chest discomfort likely GI in origin given prior negative workup, unchanged EKGs, and repeatedly normal cardiac markers.  It is okay to discharge the patient from the hospital now today she feels better. She should follow-up with her cardiologist, Dr. Ena Dawley if recurrent chest discomfort.

## 2016-07-08 NOTE — Care Management Note (Signed)
Case Management Note  Patient Details  Name: Marilyn Rivas MRN: [id number] Date of Birth: 1940-11-12  Subjective/Objective: 75 y/o f admitted w/Chest pain. From home.                   Action/Plan:d/c home.   Expected Discharge Date:   (unknown)               Expected Discharge Plan:  Home/Self Care  In-House Referral:     Discharge planning Services  CM Consult  Post Acute Care Choice:    Choice offered to:     DME Arranged:    DME Agency:     HH Arranged:    [place] Agency:     Status of Service:  Completed, signed off  If discussed at [employer] of Stay Meetings, dates discussed:    Additional Comments:  Dessa Phi, RN 07/08/2016, 1:43 PM

## 2016-07-08 NOTE — Discharge Summary (Signed)
Physician Discharge Summary  Marilyn Rivas [id number] DOB: July 17, 1941 (75) DOA: 07/07/2016  PCP: Irven Shelling, MD  Admit date: 07/07/2016 Discharge date: 07/08/2016  Admitted From: Home Disposition:  Home Recommendations for Outpatient Follow-up:  1. Follow up with PCP in 1 weeks for Hospital Follow up and evaluation for Right Arm Hematoma from IV Infiltration 2. Please obtain BMP/CBC in one week 3. Follow up with Cardiology Dr. Ena Dawley as needed  Home Health: No Equipment/Devices: None  Discharge Condition: Stable CODE STATUS: FULL Diet recommendation: Heart Healthy / Carb Modified  Brief/Interim Summary: Patient is a 75 year old [place] female with  PMH of GERD/Hiatial Hernia, Prior L5-S1 Diskectomy, Hx of Negative Cath in 2015 followed by Dr. Meda Coffee and other comorbidites who presented with a 2-3 Day Hx of Intermittent central chest discomfort as well as Nausea and Anorexia. Patient described the pain as a constant pressure like sensation with no radiation with a 6-8/10 in Severity. She had tried OTC meds with no relief so came to the ED for evaluation. Was admitted for Atypical CP r/o ACS and was given Nitro x 2 and a GI Cocktail with relief of her symptoms. She had TWI on EKG but negative sets of enzymes x 3. Cardiology was consulted for further input because of risk factors and cleared the patient to be D/C'd with no additional workup. Patient developed a hematoma in the Right Arm from IV infiltration and had warm compresses placed on it. She was advised to follow up with her PCP in the Outpatient setting to check on it for resolution. She is medically stable at this time and will be D/C'd Home.   Discharge Diagnoses:  Active Problems:   Chest pain  Atypical Chest Pain r/o'd ACS likely GERD -Troponin I x 3 Negative -NTG x2 and GI Cocktail Helped -Normal Coronary Angiography in 2015; Sees Dr. Julienne Kass -EKG had Inverted T Waves but it was not  new. -Cardiology Evaluated and no further interventions and recommended follow up with Dr. Ena Dawley if Reccurent Chest Discomfort  Right Arm Hematoma -Likely 2/2 to IV -Warm Compresses -Advised Follow up with PCP for Evaluation on Mondary or Tuesday  Hyperlipidemia -Statin Intolerant. May benefit from PCSK9 Inhibitor. Needs to Discuss with PCP  GERD/Hiatal Hernia/Diverticular Disease -Continue Home Omeprazole and Hysocyamine  Hypothyroidism -C/w Home Synthroid  Discharge Instructions  Discharge Instructions    Call MD for:  difficulty breathing, headache or visual disturbances    Complete by:  As directed    Call MD for:  persistant dizziness or light-headedness    Complete by:  As directed    Call MD for:  persistant nausea and vomiting    Complete by:  As directed    Diet - low sodium heart healthy    Complete by:  As directed    Discharge instructions    Complete by:  As directed    Follow up with PCP and with Cardiology as an outpatient. Have PCP evaluate Hematoma on Right Arm from IV infiltration and apply warm compresses. Take all medication as prescribed. If symptoms worsen or change please return to the ER or PCP for evaluation.   Increase activity slowly    Complete by:  As directed        Medication List    TAKE these medications   acetaminophen 500 MG tablet Commonly known as:  TYLENOL Take 1,000 mg by mouth every morning.   CALCIUM CITRATE + PO Take 3 tablets by mouth daily. Contain 800mg   calcium citrate and other vitamins D,C, K, Mg, zinc, copper   clobetasol cream 0.05 % Commonly known as:  TEMOVATE Apply 1 application topically 2 (two) times daily. What changed:  when to take this  reasons to take this   diphenhydramine-acetaminophen 25-500 MG Tabs tablet Commonly known as:  TYLENOL PM Take 1-2 tablets by mouth at bedtime as needed (For pain or sleep.).   HAIR/SKIN/NAILS PO Take 1 tablet by mouth daily.   hyoscyamine 0.125 MG SL  tablet Commonly known as:  LEVSIN/SL Place 1 tablet (0.125 mg total) under the tongue every 4 (four) hours as needed. What changed:  reasons to take this   omeprazole 20 MG tablet Commonly known as:  PRILOSEC OTC Take 20 mg by mouth daily as needed (For heartburn or acid reflux.).   oxyCODONE-acetaminophen 5-325 MG tablet Commonly known as:  PERCOCET Take 1-2 tablets by mouth every 4 (four) hours as needed for severe pain.   PROBIOTIC PO Take 1-2 capsules by mouth as needed (for GI support).   SYNTHROID 100 MCG tablet Generic drug:  levothyroxine Take 100 mcg by mouth daily before breakfast.   TURMERIC PO Take 1 capsule by mouth daily.   Vitamin D3 5000 units Tabs Take 1 tablet by mouth every Monday, Wednesday, and Friday.      Follow-up Information    Irven Shelling, MD. Schedule an appointment as soon as possible for a visit in 1 week(s).   Specialty:  Internal Medicine Why:  Follow up for Hospital D/C and for Right Arm Hematoma Evaluation.  Contact information: [address] [place] [address] [phone]        Ena Dawley, MD Follow up in 1 week(s).   Specialty:  Cardiology Why:  Hospital Follow up for CP r/o Contact information: [address] [place] [address] [phone]          Allergies  Allergen Reactions  . Aleve [Naproxen Sodium] Other (See Comments)    Severe abdominal pain  . Aspirin Other (See Comments)    severe abdominal pain and excessive salavation   . Atorvastatin Other (See Comments)    myalgia  . Crestor [Rosuvastatin] Other (See Comments)    Muscle aches  . Durezol [Difluprednate] Other (See Comments)    Eye redness    . Other Other (See Comments)    Paseo eye drops - causes eye redness  . Statins Other (See Comments)    Muscle cramps  . Adhesive [Tape] Rash and Other (See Comments)    Looks burned  . Clarithromycin Rash and Other (See Comments)    Says allergic to  "mycins"  . Penicillins Swelling, Rash and Other (See Comments)    Hands swelling Has patient had a PCN reaction causing immediate rash, facial/tongue/throat swelling, SOB or lightheadedness with hypotension: yes Has patient had a PCN reaction causing severe rash involving mucus membranes or skin necrosis: no Has patient had a PCN reaction that required hospitalization no Has patient had a PCN reaction occurring within the last 10 years: no If all of the above answers are "NO", then may proceed with Cephalosporin use.    Marland Kitchen Zofran [Ondansetron Hcl] Other (See Comments)    Headache     Consultations:  Cardiology   Procedures/Studies: Dg Chest 2 View  Result Date: 07/07/2016 CLINICAL DATA:  Mid chest pain and heaviness for the past 3 days. History of hyperlipidemia, former smoker. EXAM: CHEST  2 VIEW COMPARISON:  PA and lateral chest  x-ray of February 19, 2014 FINDINGS: The lungs are adequately inflated. There is no focal infiltrate. There is stable scarring in the right middle lobe and just above the right lateral costophrenic angle. The heart and pulmonary vascularity are normal. There is calcification in the wall of the aortic arch. There is no pleural effusion or pneumothorax. The bony thorax exhibits no acute abnormality. IMPRESSION: Chronic interstitial changes likely reflecting scarring in the right middle lobe. No acute pneumonia nor other acute cardiopulmonary abnormality. Aortic atherosclerosis. Electronically Signed   By: David  Martinique M.D.   On: 07/07/2016 09:45    Subjective: Seen and examined at bedside and was very agitated and angry and wanted to go home. Very Displeased with whole hospitalization and course and complained about being in the ER too long, the food not being good, and stated that the care she received was not good. Tried to explain to her she was admitted for a reason and that was to rule out Cardiac Causes for her Chest Pain and she did not understand all the blood  work and felt like she didn't have a pleasant experience. She developed a hematoma from IV Infiltration and she was concerned about that. States her CP resolved and wants to leave ASAP. No other concerns or complaints at this time as patient is very upset and wanting to leave.    Discharge Exam: Vitals:   07/08/16 0416 07/08/16 0907  BP: 139/66 (!) 148/53  Pulse: 65   Resp: 18   Temp: 98 F (36.7 C)    Vitals:   07/07/16 1643 07/07/16 2206 07/08/16 0416 07/08/16 0907  BP: 106/61 (!) 102/50 139/66 (!) 148/53  Pulse: 67 60 65   Resp: 20 18 18    Temp: 98.1 F (36.7 C) 98 F (36.7 C) 98 F (36.7 C)   TempSrc: Oral Oral Oral   SpO2: 97% 99% 98%   Weight: 75.1 kg (165 lb 9.6 oz)     Height: 5\' 1"  (1.549 m)      General: Pt is alert, awake, not in acute distress Cardiovascular: RRR, S1/S2 +, no rubs, no gallops Respiratory: CTA bilaterally, no wheezing, no rhonchi Abdominal: Soft, NT, ND, bowel sounds + Extremities: no edema, no cyanosis; Right Arm Hematoma from IV Infiltration.  The results of significant diagnostics from this hospitalization (including imaging, microbiology, ancillary and laboratory) are listed below for reference.    Microbiology: No results found for this or any previous visit (from the past 240 hour(s)).   Labs: BNP (last 3 results) No results for input(s): BNP in the last 8760 hours. Basic Metabolic Panel:  Recent Labs Lab 07/07/16 1005 07/08/16 0500  NA 137 142  K 3.6 4.0  CL 105 110  CO2 24 26  GLUCOSE 246* 104*  BUN 8 9  CREATININE 0.76 0.57  CALCIUM 9.6 9.4   Liver Function Tests: No results for input(s): AST, ALT, ALKPHOS, BILITOT, PROT, ALBUMIN in the last 168 hours. No results for input(s): LIPASE, AMYLASE in the last 168 hours. No results for input(s): AMMONIA in the last 168 hours. CBC:  Recent Labs Lab 07/07/16 1005  WBC 7.8  HGB 15.5*  HCT 44.9  MCV 91.8  PLT 260   Cardiac Enzymes:  Recent Labs Lab 07/07/16 1724  07/07/16 2230 07/08/16 0500  TROPONINI <0.03 <0.03 <0.03   BNP: Invalid input(s): POCBNP CBG: No results for input(s): GLUCAP in the last 168 hours. D-Dimer  Recent Labs  07/07/16 1005  DDIMER <0.27   Hgb A1c  No results for input(s): HGBA1C in the last 72 hours. Lipid Profile No results for input(s): CHOL, HDL, LDLCALC, TRIG, CHOLHDL, LDLDIRECT in the last 72 hours. Thyroid function studies No results for input(s): TSH, T4TOTAL, T3FREE, THYROIDAB in the last 72 hours.  Invalid input(s): FREET3 Anemia work up No results for input(s): VITAMINB12, FOLATE, FERRITIN, TIBC, IRON, RETICCTPCT in the last 72 hours. Urinalysis    Component Value Date/Time   COLORURINE YELLOW 02/20/2015 1120   APPEARANCEUR CLEAR 02/20/2015 1120   LABSPEC 1.025 02/20/2015 1120   PHURINE 6.0 02/20/2015 1120   GLUCOSEU NEGATIVE 02/20/2015 1120   HGBUR TRACE-INTACT (A) 02/20/2015 1120   BILIRUBINUR NEGATIVE 02/20/2015 1120   BILIRUBINUR neg 10/26/2012 1022   KETONESUR NEGATIVE 02/20/2015 1120   PROTEINUR NEG 02/12/2015 1152   UROBILINOGEN 0.2 02/20/2015 1120   NITRITE NEGATIVE 02/20/2015 1120   LEUKOCYTESUR TRACE (A) 02/20/2015 1120   Sepsis Labs Invalid input(s): PROCALCITONIN,  WBC,  LACTICIDVEN Microbiology No results found for this or any previous visit (from the past 240 hour(s)).  Time coordinating discharge: Over 30 minutes  SIGNED:  Kerney Elbe, DO [facility] 07/08/2016, 2:06 PM Pager [phone]  If 7PM-7AM, please contact night-coverage [email or web address] Password [email or web address]

## 2016-07-08 NOTE — Care Management Obs Status (Signed)
[place] NOTIFICATION   Patient Details  Name: Marilyn Rivas MRN: [id number] Date of Birth: [birth year]   Medicare Observation Status Notification Given:  Yes    MahabirJuliann Pulse, RN 07/08/2016, 1:43 PM

## 2016-07-11 ENCOUNTER — Telehealth: Payer: Self-pay | Admitting: Cardiology

## 2016-07-11 NOTE — Telephone Encounter (Signed)
Patient wants to know why she was prescribed nitroglycerine.  She states that she does not have heart problems or a hx of heart problems.  She wants the Nitro taken off of her record.

## 2016-07-11 NOTE — Telephone Encounter (Signed)
I spoke to patient in regards to NTG rx. I advised her she was given NTG in 03/2014 and it was ordered while in ED 07/07/16, but was dc'd at discharge from ED. She states pharmacy has rx for NTG ready for pick up.  She states she is angry because that is now on her medical record and she does not need that medication. I advised her I do not see it was sent to pharmacy, however, because we did not rx the medication I can not change it.  I advised her to contact the prescribing physician (Rock, DO).  She voiced understanding and thanks for call back.

## 2016-07-12 DIAGNOSIS — M6281 Muscle weakness (generalized): Secondary | ICD-10-CM | POA: Diagnosis not present

## 2016-07-12 DIAGNOSIS — M25571 Pain in right ankle and joints of right foot: Secondary | ICD-10-CM | POA: Diagnosis not present

## 2016-07-12 DIAGNOSIS — M7661 Achilles tendinitis, right leg: Secondary | ICD-10-CM | POA: Diagnosis not present

## 2016-07-12 DIAGNOSIS — M66361 Spontaneous rupture of flexor tendons, right lower leg: Secondary | ICD-10-CM | POA: Diagnosis not present

## 2016-07-14 DIAGNOSIS — E039 Hypothyroidism, unspecified: Secondary | ICD-10-CM | POA: Diagnosis not present

## 2016-07-14 DIAGNOSIS — Z6839 Body mass index (BMI) 39.0-39.9, adult: Secondary | ICD-10-CM | POA: Diagnosis not present

## 2016-07-14 DIAGNOSIS — R7309 Other abnormal glucose: Secondary | ICD-10-CM | POA: Diagnosis not present

## 2016-07-14 DIAGNOSIS — E559 Vitamin D deficiency, unspecified: Secondary | ICD-10-CM | POA: Diagnosis not present

## 2016-07-14 DIAGNOSIS — M79671 Pain in right foot: Secondary | ICD-10-CM | POA: Diagnosis not present

## 2016-07-14 DIAGNOSIS — E785 Hyperlipidemia, unspecified: Secondary | ICD-10-CM | POA: Diagnosis not present

## 2016-07-15 DIAGNOSIS — M66361 Spontaneous rupture of flexor tendons, right lower leg: Secondary | ICD-10-CM | POA: Diagnosis not present

## 2016-07-15 DIAGNOSIS — M6281 Muscle weakness (generalized): Secondary | ICD-10-CM | POA: Diagnosis not present

## 2016-07-15 DIAGNOSIS — M7661 Achilles tendinitis, right leg: Secondary | ICD-10-CM | POA: Diagnosis not present

## 2016-07-15 DIAGNOSIS — M25571 Pain in right ankle and joints of right foot: Secondary | ICD-10-CM | POA: Diagnosis not present

## 2016-07-18 DIAGNOSIS — E785 Hyperlipidemia, unspecified: Secondary | ICD-10-CM | POA: Diagnosis not present

## 2016-07-18 DIAGNOSIS — E039 Hypothyroidism, unspecified: Secondary | ICD-10-CM | POA: Diagnosis not present

## 2016-07-18 DIAGNOSIS — M6281 Muscle weakness (generalized): Secondary | ICD-10-CM | POA: Diagnosis not present

## 2016-07-18 DIAGNOSIS — M7661 Achilles tendinitis, right leg: Secondary | ICD-10-CM | POA: Diagnosis not present

## 2016-07-18 DIAGNOSIS — E559 Vitamin D deficiency, unspecified: Secondary | ICD-10-CM | POA: Diagnosis not present

## 2016-07-18 DIAGNOSIS — M25571 Pain in right ankle and joints of right foot: Secondary | ICD-10-CM | POA: Diagnosis not present

## 2016-07-18 DIAGNOSIS — M66361 Spontaneous rupture of flexor tendons, right lower leg: Secondary | ICD-10-CM | POA: Diagnosis not present

## 2016-07-18 DIAGNOSIS — R7309 Other abnormal glucose: Secondary | ICD-10-CM | POA: Diagnosis not present

## 2016-07-19 ENCOUNTER — Ambulatory Visit (AMBULATORY_SURGERY_CENTER): Payer: Self-pay

## 2016-07-19 ENCOUNTER — Telehealth: Payer: Self-pay

## 2016-07-19 VITALS — Ht 61.0 in | Wt 165.0 lb

## 2016-07-19 DIAGNOSIS — Z860100 Personal history of colon polyps, unspecified: Secondary | ICD-10-CM

## 2016-07-19 DIAGNOSIS — M25571 Pain in right ankle and joints of right foot: Secondary | ICD-10-CM | POA: Diagnosis not present

## 2016-07-19 MED ORDER — SUPREP BOWEL PREP KIT 17.5-3.13-1.6 GM/177ML PO SOLN: 1.0000 | Freq: Once | ORAL | 0 refills | Status: AC

## 2016-07-19 NOTE — Telephone Encounter (Signed)
Dr Tamala Julian,     Please let me know if OK to proceed with recall colonoscopy for hx of tubular adenoma.  She was recently seen in ED 07/07/16 for chest pain, EKD inverted t waves which was noted to not be new.  Please give cardiac clearance.                                                                Thank you,                                                                       Thurston Pounds RN

## 2016-07-19 NOTE — Telephone Encounter (Signed)
Cleared to proceed with colonoscopy.

## 2016-07-19 NOTE — Progress Notes (Signed)
No allergies to eggs or soy No past problems with anesthesia No diet meds No home oxygen  Declined emmi 

## 2016-07-20 DIAGNOSIS — M25571 Pain in right ankle and joints of right foot: Secondary | ICD-10-CM | POA: Diagnosis not present

## 2016-07-20 DIAGNOSIS — M6281 Muscle weakness (generalized): Secondary | ICD-10-CM | POA: Diagnosis not present

## 2016-07-20 DIAGNOSIS — M7661 Achilles tendinitis, right leg: Secondary | ICD-10-CM | POA: Diagnosis not present

## 2016-07-20 DIAGNOSIS — M66361 Spontaneous rupture of flexor tendons, right lower leg: Secondary | ICD-10-CM | POA: Diagnosis not present

## 2016-07-25 ENCOUNTER — Telehealth: Payer: Self-pay | Admitting: Cardiology

## 2016-07-25 NOTE — Telephone Encounter (Signed)
New Message  Pt voiced she is wanting to see MD-Nelson, offered 1.24.18 pt declined and voiced that's too far away, offered PA/APP-Brittainy Rosita Fire on tomorrow and pt declined.  Pt voiced Lyda Jester seen pt in hospital and she recommended she see MD-Nelson and does not want to see her if she could not provide a diagnosis while in hospital.  Pt voiced she was in hospital on 11.9.17 with CP and wants to see MD-Nelson.  Pt is very upset about availability to see MD-Nelson.  Please f/u with pt

## 2016-07-25 NOTE — Telephone Encounter (Signed)
There are no appts available with Dr. Meda Coffee at this time.  I will forward to [facility] and Dr Meda Coffee for review.

## 2016-07-26 DIAGNOSIS — R0789 Other chest pain: Secondary | ICD-10-CM | POA: Diagnosis not present

## 2016-07-26 NOTE — Telephone Encounter (Signed)
Followed up with the pt about concerns to see Dr Meda Coffee, vs a PA on her team for post hospital follow-up.  Pt reports she is very upset with our office and the entire [facility], for not explaining and following up with her, about her hospital admission on 11/09. Pt was discharged the very next day, 11/10, from [facility], and was advised to follow-up with her PCP in one week post discharge, and follow-up with Cardiology as needed.  Pts primary Dx was GERD related vs cardiac in nature.  Pt wanted to see Dr Meda Coffee this week, for she states on discharge there was a prescription for Nitroglycerin sent to her pharmacy to pick up, and the pt states "if my diagnosis were only GI related, then why did they send a script for Nitro to my pharmacy?" Explained the rationale of why nitro was sent to her pharmacy, and the pt states "this is absurd and your feeding me a bunch of non-sense."  Pt states "the only reason a Provider would send Nitro to one's pharmacy, when being discharged from the hospital, is because I had a heart attack, or they think I'm going to have a heart attack in the near future." Spent over 15 mins on the phone going over the pts discharge papers from the hospital.   Majestic thoroughly over her medication instructions, the rationale for any new meds prescribed, and her primary diagnosis.  Pt still very argumentative over the nitro called to her pharmacy, and not being able to see Dr Meda Coffee this week as she requested. Apologies given to the pt and informed her that Dr Meda Coffee has no availabilities this week. Offered the pt multiple appts with different Extenders on Dr [employer] team, and pt stated that she is done with this conversation.  Pt then hung up.  Attempted to call back x 1, and pt did not answer.

## 2016-07-26 NOTE — Telephone Encounter (Signed)
I can see her tomorrow in the afternoon 

## 2016-07-26 NOTE — Telephone Encounter (Signed)
Reached out to the pt and informed her that per Dr Meda Coffee, we can work her into the schedule for tomorrow afternoon, for concerns mentioned. Offered the pt several different time slots on Dr [employer] schedule for tomorrow afternoon. Per the pt she states "that will not be necessary at all, for I just saw Dr Laurann Montana." Informed the pt that we would be glad to see her and asked the pt how her PCP appt went?  Per the pt she states "I don't feel like offering you this information right now, and now is not a good time to talk to your office about my issues." Informed the pt that we will be glad to see her tomorrow and address any cardiac questions or concerns she has.  Pt declined this again.  Reassured the pt that we are here for her and any of her cardiac needs.  Pt then hung up the phone again. Will route this message to Dr Meda Coffee as an Juluis Rainier, to make her aware of multiple attempts made to schedule this pt, and pt refused each time.

## 2016-08-02 ENCOUNTER — Encounter: Payer: Self-pay | Admitting: Internal Medicine

## 2016-08-11 DIAGNOSIS — E039 Hypothyroidism, unspecified: Secondary | ICD-10-CM | POA: Diagnosis not present

## 2016-08-11 DIAGNOSIS — Z6831 Body mass index (BMI) 31.0-31.9, adult: Secondary | ICD-10-CM | POA: Diagnosis not present

## 2016-08-11 DIAGNOSIS — E785 Hyperlipidemia, unspecified: Secondary | ICD-10-CM | POA: Diagnosis not present

## 2016-09-08 DIAGNOSIS — H04123 Dry eye syndrome of bilateral lacrimal glands: Secondary | ICD-10-CM | POA: Diagnosis not present

## 2016-09-29 DIAGNOSIS — E785 Hyperlipidemia, unspecified: Secondary | ICD-10-CM | POA: Diagnosis not present

## 2016-09-29 DIAGNOSIS — Z01419 Encounter for gynecological examination (general) (routine) without abnormal findings: Secondary | ICD-10-CM | POA: Diagnosis not present

## 2016-09-29 DIAGNOSIS — E039 Hypothyroidism, unspecified: Secondary | ICD-10-CM | POA: Diagnosis not present

## 2016-09-29 DIAGNOSIS — Z0001 Encounter for general adult medical examination with abnormal findings: Secondary | ICD-10-CM | POA: Diagnosis not present

## 2016-09-29 DIAGNOSIS — N760 Acute vaginitis: Secondary | ICD-10-CM | POA: Diagnosis not present

## 2016-09-29 DIAGNOSIS — Z0000 Encounter for general adult medical examination without abnormal findings: Secondary | ICD-10-CM | POA: Diagnosis not present

## 2016-09-29 DIAGNOSIS — M179 Osteoarthritis of knee, unspecified: Secondary | ICD-10-CM | POA: Diagnosis not present

## 2016-09-29 DIAGNOSIS — E669 Obesity, unspecified: Secondary | ICD-10-CM | POA: Diagnosis not present

## 2016-09-29 DIAGNOSIS — M79671 Pain in right foot: Secondary | ICD-10-CM | POA: Diagnosis not present

## 2016-09-29 DIAGNOSIS — R42 Dizziness and giddiness: Secondary | ICD-10-CM | POA: Diagnosis not present

## 2016-10-04 DIAGNOSIS — L821 Other seborrheic keratosis: Secondary | ICD-10-CM | POA: Diagnosis not present

## 2016-10-04 DIAGNOSIS — D225 Melanocytic nevi of trunk: Secondary | ICD-10-CM | POA: Diagnosis not present

## 2016-10-04 DIAGNOSIS — L300 Nummular dermatitis: Secondary | ICD-10-CM | POA: Diagnosis not present

## 2016-10-04 DIAGNOSIS — D1801 Hemangioma of skin and subcutaneous tissue: Secondary | ICD-10-CM | POA: Diagnosis not present

## 2016-10-04 DIAGNOSIS — L814 Other melanin hyperpigmentation: Secondary | ICD-10-CM | POA: Diagnosis not present

## 2016-10-06 ENCOUNTER — Encounter: Payer: Self-pay | Admitting: Internal Medicine

## 2016-11-02 ENCOUNTER — Encounter: Payer: Self-pay | Admitting: Internal Medicine

## 2016-11-08 DIAGNOSIS — M25562 Pain in left knee: Secondary | ICD-10-CM | POA: Diagnosis not present

## 2016-11-08 DIAGNOSIS — E039 Hypothyroidism, unspecified: Secondary | ICD-10-CM | POA: Diagnosis not present

## 2016-11-08 DIAGNOSIS — E785 Hyperlipidemia, unspecified: Secondary | ICD-10-CM | POA: Diagnosis not present

## 2016-11-08 DIAGNOSIS — K5792 Diverticulitis of intestine, part unspecified, without perforation or abscess without bleeding: Secondary | ICD-10-CM | POA: Diagnosis not present

## 2016-11-10 ENCOUNTER — Telehealth: Payer: Self-pay | Admitting: Internal Medicine

## 2016-11-10 NOTE — Telephone Encounter (Signed)
Patient advised. She states "that is a very poor choice". I did ask her if she had a PCP she could contact. She said "yes I do, I will contact them  And thank you for nothing. Good-bye". Patient then hung up the phone.

## 2016-11-10 NOTE — Telephone Encounter (Signed)
She has extensive left sided diverticulosis, given she has no fever or abdominal pain will hold off antibiotics. Full liquid diet for today. Small meals. Levsin 0.125 mg q6h as needed for abdominal cramps, fullness and pressure. Please advise patient to call back if symptoms worsen. Thanks

## 2016-11-10 NOTE — Telephone Encounter (Signed)
Doc of Day  A patient of Dr Blanch Media. Scheduled for her recall colon in May. There is a history of diverticulitis. She has called because she is having her "typical symptoms" of diverticulitis. Symptom are left lower quadrant pain and sensation of "fullness and pressure." Her stools are full of mucous but no diarrhea or blood in the stool. Her symptoms started on Sunday. She is on a soft diet and is taking dicyclomine, but the symptoms are progressing. She is afebrile. Denies nausea or vomiting. She reports she responds well to Cipro and Flagyl. She is asking for this treatment. Please advise.

## 2016-11-14 DIAGNOSIS — M6281 Muscle weakness (generalized): Secondary | ICD-10-CM | POA: Diagnosis not present

## 2016-11-14 DIAGNOSIS — M25662 Stiffness of left knee, not elsewhere classified: Secondary | ICD-10-CM | POA: Diagnosis not present

## 2016-11-14 DIAGNOSIS — Z96652 Presence of left artificial knee joint: Secondary | ICD-10-CM | POA: Diagnosis not present

## 2016-11-17 DIAGNOSIS — M6281 Muscle weakness (generalized): Secondary | ICD-10-CM | POA: Diagnosis not present

## 2016-11-17 DIAGNOSIS — Z96652 Presence of left artificial knee joint: Secondary | ICD-10-CM | POA: Diagnosis not present

## 2016-11-17 DIAGNOSIS — M25662 Stiffness of left knee, not elsewhere classified: Secondary | ICD-10-CM | POA: Diagnosis not present

## 2016-11-22 DIAGNOSIS — Z96652 Presence of left artificial knee joint: Secondary | ICD-10-CM | POA: Diagnosis not present

## 2016-11-22 DIAGNOSIS — M6281 Muscle weakness (generalized): Secondary | ICD-10-CM | POA: Diagnosis not present

## 2016-11-22 DIAGNOSIS — M25662 Stiffness of left knee, not elsewhere classified: Secondary | ICD-10-CM | POA: Diagnosis not present

## 2016-11-24 DIAGNOSIS — M6281 Muscle weakness (generalized): Secondary | ICD-10-CM | POA: Diagnosis not present

## 2016-11-24 DIAGNOSIS — M25662 Stiffness of left knee, not elsewhere classified: Secondary | ICD-10-CM | POA: Diagnosis not present

## 2016-11-24 DIAGNOSIS — Z96652 Presence of left artificial knee joint: Secondary | ICD-10-CM | POA: Diagnosis not present

## 2016-11-28 DIAGNOSIS — M25662 Stiffness of left knee, not elsewhere classified: Secondary | ICD-10-CM | POA: Diagnosis not present

## 2016-11-28 DIAGNOSIS — M6281 Muscle weakness (generalized): Secondary | ICD-10-CM | POA: Diagnosis not present

## 2016-11-28 DIAGNOSIS — Z96652 Presence of left artificial knee joint: Secondary | ICD-10-CM | POA: Diagnosis not present

## 2016-12-01 DIAGNOSIS — M25662 Stiffness of left knee, not elsewhere classified: Secondary | ICD-10-CM | POA: Diagnosis not present

## 2016-12-01 DIAGNOSIS — M6281 Muscle weakness (generalized): Secondary | ICD-10-CM | POA: Diagnosis not present

## 2016-12-01 DIAGNOSIS — Z96652 Presence of left artificial knee joint: Secondary | ICD-10-CM | POA: Diagnosis not present

## 2016-12-06 DIAGNOSIS — M25662 Stiffness of left knee, not elsewhere classified: Secondary | ICD-10-CM | POA: Diagnosis not present

## 2016-12-06 DIAGNOSIS — M2681 Anterior soft tissue impingement: Secondary | ICD-10-CM | POA: Diagnosis not present

## 2016-12-06 DIAGNOSIS — Z96652 Presence of left artificial knee joint: Secondary | ICD-10-CM | POA: Diagnosis not present

## 2016-12-08 DIAGNOSIS — Z96652 Presence of left artificial knee joint: Secondary | ICD-10-CM | POA: Diagnosis not present

## 2016-12-08 DIAGNOSIS — M25662 Stiffness of left knee, not elsewhere classified: Secondary | ICD-10-CM | POA: Diagnosis not present

## 2016-12-08 DIAGNOSIS — M6281 Muscle weakness (generalized): Secondary | ICD-10-CM | POA: Diagnosis not present

## 2016-12-13 DIAGNOSIS — M6281 Muscle weakness (generalized): Secondary | ICD-10-CM | POA: Diagnosis not present

## 2016-12-13 DIAGNOSIS — M25662 Stiffness of left knee, not elsewhere classified: Secondary | ICD-10-CM | POA: Diagnosis not present

## 2016-12-13 DIAGNOSIS — Z96652 Presence of left artificial knee joint: Secondary | ICD-10-CM | POA: Diagnosis not present

## 2016-12-15 DIAGNOSIS — Z96652 Presence of left artificial knee joint: Secondary | ICD-10-CM | POA: Diagnosis not present

## 2016-12-15 DIAGNOSIS — M6281 Muscle weakness (generalized): Secondary | ICD-10-CM | POA: Diagnosis not present

## 2016-12-15 DIAGNOSIS — M25662 Stiffness of left knee, not elsewhere classified: Secondary | ICD-10-CM | POA: Diagnosis not present

## 2016-12-26 DIAGNOSIS — Z1231 Encounter for screening mammogram for malignant neoplasm of breast: Secondary | ICD-10-CM | POA: Diagnosis not present

## 2016-12-27 DIAGNOSIS — E039 Hypothyroidism, unspecified: Secondary | ICD-10-CM | POA: Diagnosis not present

## 2016-12-27 DIAGNOSIS — R109 Unspecified abdominal pain: Secondary | ICD-10-CM | POA: Diagnosis not present

## 2016-12-27 DIAGNOSIS — M545 Low back pain: Secondary | ICD-10-CM | POA: Diagnosis not present

## 2016-12-27 DIAGNOSIS — R5383 Other fatigue: Secondary | ICD-10-CM | POA: Diagnosis not present

## 2016-12-28 DIAGNOSIS — R109 Unspecified abdominal pain: Secondary | ICD-10-CM | POA: Diagnosis not present

## 2016-12-28 DIAGNOSIS — E039 Hypothyroidism, unspecified: Secondary | ICD-10-CM | POA: Diagnosis not present

## 2017-01-10 ENCOUNTER — Telehealth: Payer: Self-pay

## 2017-01-10 ENCOUNTER — Ambulatory Visit (AMBULATORY_SURGERY_CENTER): Payer: Self-pay

## 2017-01-10 VITALS — Ht 60.0 in | Wt 166.6 lb

## 2017-01-10 DIAGNOSIS — M25662 Stiffness of left knee, not elsewhere classified: Secondary | ICD-10-CM | POA: Diagnosis not present

## 2017-01-10 DIAGNOSIS — Z860100 Personal history of colon polyps, unspecified: Secondary | ICD-10-CM

## 2017-01-10 MED ORDER — SUPREP BOWEL PREP KIT 17.5-3.13-1.6 GM/177ML PO SOLN: 1.0000 | Freq: Once | ORAL | 0 refills | Status: AC

## 2017-01-10 NOTE — Progress Notes (Signed)
No allergies to eggs or soy No diet meds No home oxygen No past problems with anesthesia  Declined emmi 

## 2017-01-10 NOTE — Telephone Encounter (Signed)
Dr Henrene Pastor ,      Pt in today for previsit and wanted to discuss last encounter.  She called as she has needed to do in the past and was not treated as she is usually by you.  KN was on call and pt requested Flagyl and Cipro as this usually helps her when diverticulitis symptoms arise.  KN had Beth, LPN suggest pt go to PCP (which she did and healed for the time being).  She feels you have treated her well in the past and she felt quite disregarded by having to consult PCP.  New and worsening symptoms have surfaced.  Can not eat raw vegetables for a couple of months.  Seems frequency and cause of diarrhea has recently changed.  Please discuss with her pre-procedure.  BTW she felt Beth's entry was completely false and she does not even recall being offered Levsin. Just FYI.  Pt is very articulate and will no doubt bring this up again when she sees you.  Very pleasant lady. Thank you, Angela/PV

## 2017-01-12 ENCOUNTER — Telehealth: Payer: Self-pay | Admitting: Internal Medicine

## 2017-01-12 NOTE — Telephone Encounter (Signed)
Returned patients call regarding the cost of Suprep. Patient states that she was not able to pay that much for a prep. I informed the patient that I would give her a sample of  Suprep. Patient will pick up sample on Monday 01/16/17 at the front desk on the fourth floor.   Riki Sheer, LPN

## 2017-01-26 ENCOUNTER — Ambulatory Visit (AMBULATORY_SURGERY_CENTER): Payer: Medicare Other | Admitting: Internal Medicine

## 2017-01-26 ENCOUNTER — Encounter: Payer: Self-pay | Admitting: Internal Medicine

## 2017-01-26 VITALS — BP 125/73 | HR 61 | Temp 97.3°F | Resp 14 | Ht 60.0 in | Wt 166.0 lb

## 2017-01-26 DIAGNOSIS — D122 Benign neoplasm of ascending colon: Secondary | ICD-10-CM | POA: Diagnosis not present

## 2017-01-26 DIAGNOSIS — Z86010 Personal history of colonic polyps: Secondary | ICD-10-CM

## 2017-01-26 DIAGNOSIS — D125 Benign neoplasm of sigmoid colon: Secondary | ICD-10-CM | POA: Diagnosis not present

## 2017-01-26 DIAGNOSIS — K635 Polyp of colon: Secondary | ICD-10-CM

## 2017-01-26 DIAGNOSIS — K219 Gastro-esophageal reflux disease without esophagitis: Secondary | ICD-10-CM | POA: Diagnosis not present

## 2017-01-26 MED ORDER — SODIUM CHLORIDE 0.9 % IV SOLN: 500.0000 mL | INTRAVENOUS | Status: DC

## 2017-01-26 NOTE — Progress Notes (Signed)
Called to room to assist during endoscopic procedure.  Patient ID and intended procedure confirmed with present staff. Received instructions for my participation in the procedure from the performing physician.  

## 2017-01-26 NOTE — Progress Notes (Signed)
Pt's states no medical or surgical changes since previsit or office visit. 

## 2017-01-26 NOTE — Progress Notes (Signed)
Report to PACU, RN, vss, BBS= Clear.  

## 2017-01-26 NOTE — Patient Instructions (Signed)
Impression/Recommendations:  Polyp handout given to patient. Diverticulosis handout given to patient.  Resume previous diet. Continue present medications.  YOU HAD AN ENDOSCOPIC PROCEDURE TODAY AT THE [facility]:   Refer to the procedure report that was given to you for any specific questions about what was found during the examination.  If the procedure report does not answer your questions, please call your gastroenterologist to clarify.  If you requested that your care partner not be given the details of your procedure findings, then the procedure report has been included in a sealed envelope for you to review at your convenience later.  YOU SHOULD EXPECT: Some feelings of bloating in the abdomen. Passage of more gas than usual.  Walking can help get rid of the air that was put into your GI tract during the procedure and reduce the bloating. If you had a lower endoscopy (such as a colonoscopy or flexible sigmoidoscopy) you may notice spotting of blood in your stool or on the toilet paper. If you underwent a bowel prep for your procedure, you may not have a normal bowel movement for a few days.  Please Note:  You might notice some irritation and congestion in your nose or some drainage.  This is from the oxygen used during your procedure.  There is no need for concern and it should clear up in a day or so.  SYMPTOMS TO REPORT IMMEDIATELY:   Following lower endoscopy (colonoscopy or flexible sigmoidoscopy):  Excessive amounts of blood in the stool  Significant tenderness or worsening of abdominal pains  Swelling of the abdomen that is new, acute  Fever of 100F or higher For urgent or emergent issues, a gastroenterologist can be reached at any hour by calling [phone].   DIET:  We do recommend a small meal at first, but then you may proceed to your regular diet.  Drink plenty of fluids but you should avoid alcoholic beverages for 24 hours.  ACTIVITY:  You should plan  to take it easy for the rest of today and you should NOT DRIVE or use heavy machinery until tomorrow (because of the sedation medicines used during the test).    FOLLOW UP: Our staff will call the number listed on your records the next business day following your procedure to check on you and address any questions or concerns that you may have regarding the information given to you following your procedure. If we do not reach you, we will leave a message.  However, if you are feeling well and you are not experiencing any problems, there is no need to return our call.  We will assume that you have returned to your regular daily activities without incident.  If any biopsies were taken you will be contacted by phone or by letter within the next 1-3 weeks.  Please call us at [phone] if you have not heard about the biopsies in 3 weeks.    SIGNATURES/CONFIDENTIALITY: You and/or your care partner have signed paperwork which will be entered into your electronic medical record.  These signatures attest to the fact that that the information above on your After Visit Summary has been reviewed and is understood.  Full responsibility of the confidentiality of this discharge information lies with you and/or your care-partner. 

## 2017-01-26 NOTE — Op Note (Signed)
[place] Patient Name: Marilyn Rivas Procedure Date: 01/26/2017 9:31 AM MRN: [id number] Endoscopist: Docia Chuck. Henrene Pastor , MD Age: 76 Referring MD:  Date of Birth: 04-02-1941 Gender: Female Account #: [id number] Procedure:                Colonoscopy, with cold snare polypectomy X2 Indications:              High risk colon cancer surveillance: Personal                            history of non-advanced adenoma. Examination 2006                            (Dr. Lajoyce Corners, tubular adenoma); 2012 (negative) Medicines:                Monitored Anesthesia Care Procedure:                Pre-Anesthesia Assessment:                           - Prior to the procedure, a History and Physical                            was performed, and patient medications and                            allergies were reviewed. The patient's tolerance of                            previous anesthesia was also reviewed. The risks                            and benefits of the procedure and the sedation                            options and risks were discussed with the patient.                            All questions were answered, and informed consent                            was obtained. Prior Anticoagulants: The patient has                            taken no previous anticoagulant or antiplatelet                            agents. ASA Grade Assessment: II - A patient with                            mild systemic disease. After reviewing the risks                            and benefits, the patient was deemed in  satisfactory condition to undergo the procedure.                           After obtaining informed consent, the colonoscope                            was passed under direct vision. Throughout the                            procedure, the patient's blood pressure, pulse, and                            oxygen saturations were monitored continuously. The                  Model CF-HQ190L [id number]) scope was introduced                            through the anus and advanced to the the cecum,                            identified by appendiceal orifice and ileocecal                            valve. The ileocecal valve, appendiceal orifice,                            and rectum were photographed. The quality of the                            bowel preparation was good. The colonoscopy was                            performed without difficulty. The patient tolerated                            the procedure well. The bowel preparation used was                            SUPREP. Scope In: 9:43:05 AM Scope Out: 9:56:20 AM Scope Withdrawal Time: 0 hours 11 minutes 7 seconds  Total Procedure Duration: 0 hours 13 minutes 15 seconds  Findings:                 Two polyps were found in the sigmoid colon and                            ascending colon. The polyps were 2 mm in size.                            These polyps were removed with a cold snare.                            Resection and retrieval were complete.  Multiple small and large-mouthed diverticula were                            found in the entire colon.                           The exam was otherwise without abnormality on                            direct and retroflexion views. Complications:            No immediate complications. Estimated blood loss:                            None. Estimated Blood Loss:     Estimated blood loss: none. Impression:               - Two 2 mm polyps in the sigmoid colon and in the                            ascending colon, removed with a cold snare.                            Resected and retrieved.                           - Diverticulosis in the entire examined colon.                           - The examination was otherwise normal on direct                            and retroflexion views. Recommendation:           -  Repeat colonoscopy in for surveillance is not                            recommended given her age, personal history, and                            favorable findings on current exam.                           - Patient has a contact number available for                            emergencies. The signs and symptoms of potential                            delayed complications were discussed with the                            patient. Return to normal activities tomorrow.                            Written discharge instructions were provided to  the                            patient.                           - Resume previous diet.                           - Continue present medications.                           - Await pathology results. Docia Chuck. Henrene Pastor, MD 01/26/2017 10:01:57 AM This report has been signed electronically.

## 2017-01-27 ENCOUNTER — Telehealth: Payer: Self-pay

## 2017-01-27 NOTE — Telephone Encounter (Signed)

## 2017-01-31 ENCOUNTER — Encounter: Payer: Self-pay | Admitting: Internal Medicine

## 2017-02-16 DIAGNOSIS — M545 Low back pain: Secondary | ICD-10-CM | POA: Diagnosis not present

## 2017-02-16 DIAGNOSIS — E039 Hypothyroidism, unspecified: Secondary | ICD-10-CM | POA: Diagnosis not present

## 2017-02-27 DIAGNOSIS — S8002XA Contusion of left knee, initial encounter: Secondary | ICD-10-CM | POA: Diagnosis not present

## 2017-03-07 DIAGNOSIS — S8002XD Contusion of left knee, subsequent encounter: Secondary | ICD-10-CM | POA: Diagnosis not present

## 2017-04-03 ENCOUNTER — Telehealth: Payer: Self-pay | Admitting: Internal Medicine

## 2017-04-03 ENCOUNTER — Encounter: Payer: Self-pay | Admitting: Internal Medicine

## 2017-04-03 NOTE — Telephone Encounter (Signed)
Needs a written prescription for   Cipro and Flagyl due to a trip next week and would like to have it on hand due to being prone to Diverticulitis. Would like to receive a call back.

## 2017-04-04 DIAGNOSIS — E785 Hyperlipidemia, unspecified: Secondary | ICD-10-CM | POA: Diagnosis not present

## 2017-04-04 DIAGNOSIS — E039 Hypothyroidism, unspecified: Secondary | ICD-10-CM | POA: Diagnosis not present

## 2017-04-07 ENCOUNTER — Telehealth: Payer: Self-pay | Admitting: Internal Medicine

## 2017-04-07 NOTE — Telephone Encounter (Signed)
Pt states she is going out of town Tuesday evening and would like to have a printed script of cipro and flagyl with her to have on hand in case she has any issues with diverticulitis. States she has had scripts like this in the past. Please advise.

## 2017-04-10 MED ORDER — CIPROFLOXACIN HCL 500 MG PO TABS: 500.0000 mg | ORAL_TABLET | Freq: Two times a day (BID) | ORAL | 0 refills | Status: DC

## 2017-04-10 MED ORDER — METRONIDAZOLE 500 MG PO TABS: 500.0000 mg | ORAL_TABLET | Freq: Two times a day (BID) | ORAL | 0 refills | Status: DC

## 2017-04-10 MED ORDER — METRONIDAZOLE 500 MG PO TABS
500.0000 mg | ORAL_TABLET | Freq: Two times a day (BID) | ORAL | 0 refills | Status: DC
Start: 2017-04-10 — End: 2017-05-04

## 2017-04-10 NOTE — Telephone Encounter (Signed)
OK to prescribe a one-week course of each to have on hand if needed

## 2017-04-10 NOTE — Telephone Encounter (Signed)
Patient notified she wants paper rx not the pills

## 2017-04-10 NOTE — Telephone Encounter (Signed)
Pt is calling back about her Cipro and Flagyl medication

## 2017-04-11 DIAGNOSIS — S8002XD Contusion of left knee, subsequent encounter: Secondary | ICD-10-CM | POA: Diagnosis not present

## 2017-04-12 ENCOUNTER — Encounter: Payer: Self-pay | Admitting: Cardiology

## 2017-04-21 DIAGNOSIS — E559 Vitamin D deficiency, unspecified: Secondary | ICD-10-CM | POA: Diagnosis not present

## 2017-04-21 DIAGNOSIS — M810 Age-related osteoporosis without current pathological fracture: Secondary | ICD-10-CM | POA: Diagnosis not present

## 2017-04-21 DIAGNOSIS — R5383 Other fatigue: Secondary | ICD-10-CM | POA: Diagnosis not present

## 2017-04-24 DIAGNOSIS — M25551 Pain in right hip: Secondary | ICD-10-CM | POA: Diagnosis not present

## 2017-04-24 DIAGNOSIS — R296 Repeated falls: Secondary | ICD-10-CM | POA: Diagnosis not present

## 2017-04-24 DIAGNOSIS — E039 Hypothyroidism, unspecified: Secondary | ICD-10-CM | POA: Diagnosis not present

## 2017-04-24 DIAGNOSIS — E785 Hyperlipidemia, unspecified: Secondary | ICD-10-CM | POA: Diagnosis not present

## 2017-05-04 ENCOUNTER — Encounter: Payer: Self-pay | Admitting: Cardiology

## 2017-05-04 ENCOUNTER — Ambulatory Visit (INDEPENDENT_AMBULATORY_CARE_PROVIDER_SITE_OTHER): Payer: Medicare Other | Admitting: Cardiology

## 2017-05-04 VITALS — BP 132/72 | HR 66 | Ht 60.0 in | Wt 166.0 lb

## 2017-05-04 DIAGNOSIS — E785 Hyperlipidemia, unspecified: Secondary | ICD-10-CM | POA: Diagnosis not present

## 2017-05-04 DIAGNOSIS — E669 Obesity, unspecified: Secondary | ICD-10-CM | POA: Insufficient documentation

## 2017-05-04 DIAGNOSIS — I10 Essential (primary) hypertension: Secondary | ICD-10-CM

## 2017-05-04 DIAGNOSIS — K5792 Diverticulitis of intestine, part unspecified, without perforation or abscess without bleeding: Secondary | ICD-10-CM | POA: Insufficient documentation

## 2017-05-04 DIAGNOSIS — M79673 Pain in unspecified foot: Secondary | ICD-10-CM | POA: Insufficient documentation

## 2017-05-04 DIAGNOSIS — E559 Vitamin D deficiency, unspecified: Secondary | ICD-10-CM | POA: Insufficient documentation

## 2017-05-04 DIAGNOSIS — R072 Precordial pain: Secondary | ICD-10-CM

## 2017-05-04 DIAGNOSIS — R9431 Abnormal electrocardiogram [ECG] [EKG]: Secondary | ICD-10-CM

## 2017-05-04 DIAGNOSIS — R7309 Other abnormal glucose: Secondary | ICD-10-CM | POA: Insufficient documentation

## 2017-05-04 NOTE — Progress Notes (Signed)
Patient ID: CELENA LANIUS, female   DOB: Sep 03, 1940, 76 y.o.   MRN: [id number]    Patient Name: Marilyn Rivas Date of Encounter: 05/04/2017  Primary Care Provider:  Willey Blade, MD Primary Cardiologist:  Ena Dawley  Problem List   Past Medical History:  Diagnosis Date  . Anxiety   . Arthritis    "left; maybe back" (01/13/2016)  . Cancer ([place])    skin pre-cancer  . Depression   . Diverticulitis   . Diverticulosis   . Family history of adverse reaction to anesthesia    "daughter gets really nauseous & has trouble getting intubated" (01/13/2016)  . GERD (gastroesophageal reflux disease)    HISTORY  . History of blood transfusion 1977   "related to OR"  . Hx of adenomatous colonic polyps   . Hyperlipidemia   . Hypothyroidism   . Lichen sclerosus 0/1601   Biopsy proven  . Menopause   . Osteopenia   . Pneumonia 1990s X 1; early 2000s X 1  . Seasonal allergies   . Thyroid disease    Past Surgical History:  Procedure Laterality Date  . ACHILLES TENDON SURGERY Right 04/25/2016   Procedure: ACHILLES TENDON REPAIR,PARTIAL EXCISION CALCANEOUS;  Surgeon: [name], MD;  Location: [facility];  Service: Orthopedics;  Laterality: Right;  . APPENDECTOMY  1977  . BACK SURGERY    . BLEPHAROPLASTY Bilateral   . CARDIAC CATHETERIZATION  ~ 2014  . CARPAL TUNNEL RELEASE Right   . CATARACT EXTRACTION W/ INTRAOCULAR LENS  IMPLANT, BILATERAL Bilateral   . CERVICAL FUSION  1999   C5-7  . DILATATION & CURETTAGE/HYSTEROSCOPY WITH TRUECLEAR N/A 05/02/2013   Procedure: DILATATION & CURETTAGE/HYSTEROSCOPY WITH TRUECLEAR ;  Surgeon: Anastasio Auerbach, MD;  Location: [facility];  Service: Gynecology;  Laterality: N/A;  . DILATION AND CURETTAGE OF UTERUS    . HARDWARE REMOVAL Left 12/12/2013   Procedure:  LEFT PATELLA HARDWARE REMOVAL;  Surgeon: [name], MD;  Location: [facility];  Service: Orthopedics;  Laterality: Left;  . HYSTEROSCOPY      . JOINT REPLACEMENT    . KNEE ARTHROSCOPY Left 12/12/2013   Procedure: LEFT PATELLA ARTHROSCOPY KNEE WITH DEBRIDEMENT/SHAVING (CONDROPLASTY), LYSIS OF ADHESIONS;  Surgeon: [name], MD;  Location: [facility];  Service: Orthopedics;  Laterality: Left;  . LEFT HEART CATHETERIZATION WITH CORONARY ANGIOGRAM N/A 04/22/2014   Procedure: LEFT HEART CATHETERIZATION WITH CORONARY ANGIOGRAM;  Surgeon: Sinclair Grooms, MD;  Location: [facility] CATH [facility];  Service: Cardiovascular;  Laterality: N/A;  . LUMBAR [place] SURGERY  2004   L5  . PARTIAL KNEE ARTHROPLASTY Left 01/13/2016   Procedure: LEFT UNICOMPARTMENTAL KNEE;  Surgeon: [name], MD;  Location: [facility];  Service: Orthopedics;  Laterality: Left;  . PATELLA RECONSTRUCTION Left 1994  . REPLACEMENT UNICONDYLAR JOINT KNEE Left 01/13/2016  . RIGHT OOPHORECTOMY  1978  . TONSILLECTOMY     Allergies  Allergies  Allergen Reactions  . Aleve [Naproxen Sodium] Other (See Comments)    Other reaction(s): Other Severe abdominal pain  . Aspirin Other (See Comments)    Other reaction(s): Other severe abdominal pain and excessive salavation   . Atorvastatin Other (See Comments)    Other reaction(s): Myalgias (Muscle Pain) myalgia  . Clarithromycin Rash, Other (See Comments) and Diarrhea    Says allergic to "mycins"  . Crestor  [Rosuvastatin Calcium]     Other reaction(s): Other  . Durezol [Difluprednate] Other (See Comments)  Other reaction(s): Eye Redness Eye redness    . Olopatadine Hcl     Other reaction(s): Eye Redness  . Ondansetron     Other reaction(s): Other  . Penicillins Swelling, Rash, Other (See Comments) and Itching    Hands swelling Has patient had a PCN reaction causing immediate rash, facial/tongue/throat swelling, SOB or lightheadedness with hypotension: yes Has patient had a PCN reaction causing severe rash involving mucus membranes or skin necrosis: no Has patient had a PCN reaction that required  hospitalization no Has patient had a PCN reaction occurring within the last 10 years: no If all of the above answers are "NO", then may proceed with Cephalosporin use.    . Pitavastatin     Other reaction(s): Arthralgia (Joint Pain)  . Statins Other (See Comments)    Other reaction(s): Other (See Comments) MUSCLE CRAMPS Muscle cramps  . Crestor [Rosuvastatin] Other (See Comments)    Muscle aches  . Other Other (See Comments)    Paseo eye drops - causes eye redness  . Prednisone     headache  . Adhesive [Tape] Rash and Other (See Comments)    Looks burned  . Zofran [Ondansetron Hcl] Other (See Comments)    Headache    HPI  A very pleasant 76 year old female who is coming for concern of dyspnea on exertion and fatigue. The patient is generally very active she exercises twice a week in the gym and walks almost daily. She has noticed recently that occasionally she gets short of breath on exertion but overall she feels very tired. The patient has no prior history of cardiac disease. She has never had an echocardiogram. She has known history of hyperlipidemia there was recently started on Crestor 5 mg daily. She reports muscle cramping and is scheduled for a blood work next week. The patient denies any chest pain, palpitation or recent syncope. She has smoked 30 years ago. Her father had myocardial infarction at age of 33 and underwent quadruple bypass and AAA repair at age of 65. Her daughter has a pacemaker for unknown reason.  06/17/2014 - negative cardiac cath - normal coronaries despite abnormal stress test. No chest pain, no SOB. Continues to have muscle cramping despite normal LFTS. She stopped using crestor. No palpitations, LE edema, orthopnea or syncope.  12/28/2015 - the patient is coming after 1 year, she feels very well, denies chest pain or SOB, her limitation is knee pain and strained Achilles tendon, she is undergoing a knee replacement in a week.  She used to go to [employer]  [employer]ne[employer] 3/week, but now is unable, however excite to start again. Maintains healthy diet.  05/04/2017, this is one year follow-up, patient was hospitalized in May of this year with chest pain ruled out for ACS, based on normal coronary catheterization year ago no further ischemic workup was performed and patient responded to trial off PPIs. Since then she has been having on and off indigestion type of pain that are controlled by Prilosec she denies any shortness of breath, she feels stressed out with her family history she will otherwise is attending photography and portrait classes and enjoys her life. She has pain in her back and knees but denies lower extremity edema orthopnea dyspnea she is compliant with her medications.  Home Medications  Prior to Admission medications   Medication Sig Start Date End Date Taking? Authorizing Provider  acetaminophen (TYLENOL) 325 MG tablet Take 325 mg by mouth every 6 (six) hours as needed.   Yes Historical Provider,  MD  Calcium Citrate-Vitamin D (CALCIUM CITRATE + PO) Take 4 capsules by mouth daily. Contain 800mg  calcium citrate and other vitamins D,C, K, Mg, zinc, copper   Yes Historical Provider, MD  Cholecalciferol (VITAMIN D3) 5000 UNITS TABS Take 1 tablet by mouth daily.   Yes Historical Provider, MD  diphenhydramine-acetaminophen (TYLENOL PM) 25-500 MG TABS Take 1 tablet by mouth at bedtime as needed.   Yes Historical Provider, MD  hyoscyamine (LEVSIN, ANASPAZ) 0.125 MG tablet Take 0.125 mg by mouth every 4 (four) hours as needed for cramping.   Yes Historical Provider, MD  levothyroxine (SYNTHROID, LEVOTHROID) 112 MCG tablet Take 1 tablet (112 mcg total) by mouth daily. 06/29/12  Yes Lanice Shirts, MD  omeprazole (PRILOSEC OTC) 20 MG tablet Take 20 mg by mouth daily.   Yes Historical Provider, MD  Probiotic Product (PROBIOTIC PO) Take 1-2 capsules by mouth as needed.    Yes Historical Provider, MD  rosuvastatin (CRESTOR) 5 MG tablet Take 5 mg by  mouth 2 (two) times a week. 03/06/14  Yes Philemon Kingdom, MD    Family History  Family History  Problem Relation Age of Onset  . Allergies Mother   . Heart disease Father   . Colon cancer Neg Hx     Social History  Social History   Social History  . Marital status: Divorced    Spouse name: N/A  . Number of children: 2  . Years of education: N/A   Occupational History  . 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 Smoking status: Former Smoker    Packs/day: 1.00    Years: 27.00    Types: Cigarettes    Quit date: 12/10/1983  . Smokeless tobacco: Never Used  . Alcohol use Yes     Comment: 01/13/2016 "I'll have a drink < 2 times/month"  . Drug use: No  . Sexual activity: No   Other Topics Concern  . Not on file   Social History Narrative   Daily caffeine      Review of Systems, as per HPI, otherwise negative General:  No chills, fever, night sweats or weight changes.  Cardiovascular:  No chest pain, dyspnea on exertion, edema, orthopnea, palpitations, paroxysmal nocturnal dyspnea. Dermatological: No rash, lesions/masses Respiratory: No cough, dyspnea Urologic: No hematuria, dysuria Abdominal:   No nausea, vomiting, diarrhea, bright red blood per rectum, melena, or hematemesis Neurologic:  No visual changes, wkns, changes in mental status. All other systems reviewed and are otherwise negative except as noted above.  Physical Exam  Blood pressure 132/72, pulse 66, height 5' (1.524 m), weight 166 lb (75.3 kg).  General: Pleasant, NAD Psych: Normal affect. Neuro: Alert and oriented X 3. Moves all extremities spontaneously. HEENT: Normal  Neck: Supple without bruits or JVD. Lungs:  Resp regular and unlabored, CTA. Heart: RRR no s3, s4, or murmurs. Abdomen: Soft, non-tender, non-distended, BS + x 4.  Extremities: No clubbing, cyanosis or edema. DP/PT/Radials 2+ and equal bilaterally.  Labs:  No results for input(s):  CKTOTAL, CKMB, TROPONINI in the last 72 hours. Lab Results  Component Value Date   WBC 7.8 07/07/2016   HGB 15.5 (H) 07/07/2016   HCT 44.9 07/07/2016   MCV 91.8 07/07/2016   PLT 260 07/07/2016    Lab Results  Component Value Date   DDIMER <0.27 07/07/2016   Invalid input(s): POCBNP    Component Value Date/Time   NA 142 07/08/2016 0500   K  4.0 07/08/2016 0500   CL 110 07/08/2016 0500   CO2 26 07/08/2016 0500   GLUCOSE 104 (H) 07/08/2016 0500   BUN 9 07/08/2016 0500   CREATININE 0.57 07/08/2016 0500   CREATININE 0.64 10/07/2014 1202   CALCIUM 9.4 07/08/2016 0500   PROT 6.7 10/07/2014 1202   ALBUMIN 4.4 10/07/2014 1202   AST 32 10/07/2014 1202   ALT 33 10/07/2014 1202   ALKPHOS 75 10/07/2014 1202   BILITOT 0.7 10/07/2014 1202   GFRNONAA >60 07/08/2016 0500   GFRAA >60 07/08/2016 0500   Lab Results  Component Value Date   CHOL 183 07/15/2014   HDL 50.70 07/15/2014   LDLCALC 107 (H) 07/15/2014   TRIG 126.0 07/15/2014    Accessory Clinical Findings  Echocardiogram - none  ECG - Personally reviewed on 05/04/2017 and shows normal sinus rhythm, diffuse negative T waves in inferior anterolateral leads, unchanged from prior.  Left cardiac cath: LEFT VENTRICULOGRAM: Left ventricular angiogram was done in the 30 RAO projection and revealed normal cavity size and EF 60%.  IMPRESSIONS: 1. Normal coronary arteries  2. Normal left ventricular systolic function with EF 60% and normal hemodynamics  3. Dyspnea not likely related to cardiac etiology  RECOMMENDATION: Management per primary team with other considerations being pulmonary sources of dyspnea, deconditioning, and sleep apnea.Marland Kitchen   ECG - SR, negative T waves in the inferior and anterolateral leads, unchanged from prior in 2015   Assessment & Plan  1. Abnormal ECG - with diffuse negative T waves in inferior and anterolateral leads, abnormal stress test, however normal cath. EKG unchanged today, she is asymptomatic, and  active. Occasional indigestion is relieved by Prilosec.  2. Blood pressure - well controlled  3. Hyperlipidemia - LDL 180, significant muscle pain with rosuvastatin and livalo, she had a great response LDL 180 --> 107. I verified with Elberta Leatherwood and she currently doesn't qualify for [employer] approval by insurance or for a study. Considering she doesn't have DM and had a normal cath at age 52 she is just advised to continue with healthy diet and exercise.  Follow up in 1 year.     Ena Dawley, MD, [facility] 05/04/2017, 11:03 AM

## 2017-05-04 NOTE — Patient Instructions (Signed)

## 2017-05-18 DIAGNOSIS — M810 Age-related osteoporosis without current pathological fracture: Secondary | ICD-10-CM | POA: Diagnosis not present

## 2017-05-29 DIAGNOSIS — Z23 Encounter for immunization: Secondary | ICD-10-CM | POA: Diagnosis not present

## 2017-05-31 DIAGNOSIS — E785 Hyperlipidemia, unspecified: Secondary | ICD-10-CM | POA: Diagnosis not present

## 2017-05-31 DIAGNOSIS — E039 Hypothyroidism, unspecified: Secondary | ICD-10-CM | POA: Diagnosis not present

## 2017-07-18 DIAGNOSIS — H903 Sensorineural hearing loss, bilateral: Secondary | ICD-10-CM | POA: Diagnosis not present

## 2017-07-18 DIAGNOSIS — H838X3 Other specified diseases of inner ear, bilateral: Secondary | ICD-10-CM | POA: Diagnosis not present

## 2017-07-18 DIAGNOSIS — H6983 Other specified disorders of Eustachian tube, bilateral: Secondary | ICD-10-CM | POA: Diagnosis not present

## 2017-07-24 DIAGNOSIS — E039 Hypothyroidism, unspecified: Secondary | ICD-10-CM | POA: Diagnosis not present

## 2017-07-24 DIAGNOSIS — E669 Obesity, unspecified: Secondary | ICD-10-CM | POA: Diagnosis not present

## 2017-07-24 DIAGNOSIS — N760 Acute vaginitis: Secondary | ICD-10-CM | POA: Diagnosis not present

## 2017-07-24 DIAGNOSIS — M25551 Pain in right hip: Secondary | ICD-10-CM | POA: Diagnosis not present

## 2017-07-25 DIAGNOSIS — M545 Low back pain: Secondary | ICD-10-CM | POA: Diagnosis not present

## 2017-07-26 DIAGNOSIS — M545 Low back pain: Secondary | ICD-10-CM | POA: Diagnosis not present

## 2017-12-08 NOTE — Telephone Encounter (Signed)
This encounter was created in error - please disregard.

## 2018-01-02 ENCOUNTER — Telehealth: Payer: Self-pay | Admitting: Internal Medicine

## 2018-01-02 NOTE — Telephone Encounter (Signed)
She has not been seen here in 3 years. If there are no appointments this week, he should go to the emergency room or see her PCP and person. please make her an appointment with a mid-level next week. Thank you

## 2018-01-02 NOTE — Telephone Encounter (Signed)
Patient states she is having a lot of problems and would like to speak with nurse asap.

## 2018-01-02 NOTE — Telephone Encounter (Signed)
Pt states she felt she was having a diverticulitis flare on Sunday. Reports she called her PCP and was prescribed cipro and flagyl. She vomited on Sunday and states she threw up this morning and the antibiotics came back up. She states the emesis is a yellowish clear liquid. Asked pt if she had a fever or abdominal pain. Pt states she does not think  she has a fever and the abd pain she was having has subsided after she vomited. Pt wants to know what she should do. There are no midlevel appointments available this week. Please advise.

## 2018-01-02 NOTE — Telephone Encounter (Signed)
Patient states she is having a diverticulitis attack and wants to know what to do. Pt states [facility] prescribed medication but she is still having gi symptoms.

## 2018-01-02 NOTE — Telephone Encounter (Signed)
Spoke with patient and she is aware of Dr. Blanch Media recommendation, she declined to schedule appt for next week.

## 2018-01-03 ENCOUNTER — Other Ambulatory Visit: Payer: Self-pay | Admitting: Internal Medicine

## 2018-01-03 ENCOUNTER — Ambulatory Visit
Admission: RE | Admit: 2018-01-03 | Discharge: 2018-01-03 | Disposition: A | Discharge status: Home / Self Care | Payer: Medicare Other | Source: Ambulatory Visit | Attending: Internal Medicine | Admitting: Internal Medicine

## 2018-01-03 DIAGNOSIS — R109 Unspecified abdominal pain: Secondary | ICD-10-CM

## 2018-01-10 ENCOUNTER — Ambulatory Visit: Payer: Self-pay | Admitting: Gastroenterology

## 2018-01-11 ENCOUNTER — Other Ambulatory Visit: Payer: Self-pay | Admitting: Physician Assistant

## 2018-01-11 ENCOUNTER — Ambulatory Visit
Admission: RE | Admit: 2018-01-11 | Discharge: 2018-01-11 | Disposition: A | Discharge status: Home / Self Care | Payer: Medicare Other | Source: Ambulatory Visit | Attending: Physician Assistant | Admitting: Physician Assistant

## 2018-01-11 DIAGNOSIS — R110 Nausea: Secondary | ICD-10-CM

## 2018-01-11 DIAGNOSIS — Z8719 Personal history of other diseases of the digestive system: Secondary | ICD-10-CM

## 2018-01-11 DIAGNOSIS — R1032 Left lower quadrant pain: Secondary | ICD-10-CM

## 2018-01-11 MED ORDER — IOPAMIDOL (ISOVUE-300) INJECTION 61%
100.0000 mL | Freq: Once | INTRAVENOUS | Status: AC | PRN
  Administered 2018-01-11: 100 mL via INTRAVENOUS

## 2018-01-30 ENCOUNTER — Ambulatory Visit (INDEPENDENT_AMBULATORY_CARE_PROVIDER_SITE_OTHER): Payer: Medicare Other | Admitting: Gynecology

## 2018-01-30 ENCOUNTER — Encounter: Payer: Self-pay | Admitting: Gynecology

## 2018-01-30 VITALS — BP 118/74

## 2018-01-30 DIAGNOSIS — D259 Leiomyoma of uterus, unspecified: Secondary | ICD-10-CM | POA: Diagnosis not present

## 2018-01-30 DIAGNOSIS — M810 Age-related osteoporosis without current pathological fracture: Secondary | ICD-10-CM

## 2018-01-30 NOTE — Patient Instructions (Signed)
Follow up as needed

## 2018-01-30 NOTE — Progress Notes (Signed)
    Marilyn Rivas [birth year] [id number]        77 y.o. presents having recently had a CT scan done due to a flare in her diverticulitis where it was noted "Reproductive: Lobular inhomogeneous uterus as before, suggesting multiple fibroids. No adnexal mass."  In review the patient has had a long history of small myomas asymptomatic to her.  She has no other issues as far as bleeding or menopausal symptoms.  She just want to check to make sure everything was okay noting this reported on her CT scan  Past medical history,surgical history, problem list, medications, allergies, family history and social history were all reviewed and documented in the EPIC chart.  Directed ROS with pertinent positives and negatives documented in the history of present illness/assessment and plan.  Exam: Caryn Bee assistant Vitals:   01/30/18 0801  BP: 118/74   General appearance:  Normal Abdomen soft nontender without masses guarding rebound Pelvic external BUS vagina with atrophic changes.  Cervix with atrophic changes.  Bimanual uterus difficult to palpate but not enlarged, tender or masses.  Adnexa without gross masses or tenderness  Assessment/Plan:  77 y.o. with history of leiomyoma.  This was noted on the recent CT scan which also noted the adnexa showed no abnormalities/pathology.  The patient and I discussed that she has a history of leiomyoma and that this will be seen on any studies.  They did not note any enlargement or other pathology in the pelvis.  Her exam is normal without palpable masses or uterine enlargement.  At this point my recommendation is nothing further needs to be done in the absence of any symptoms and she agrees with this.  She knows if she has any bleeding to call.  The patient also had some questions about her diagnosis of osteoporosis.  She currently is on extra calcium and vitamin D and has had a vitamin D level checked elsewhere.  She walks on a regular basis.  She has a DEXA  planning to be scheduled this coming summer and asked if she could send me a copy to review and I would be glad to do so.  She also asked about whether prophylactic hormone replacement is indicated.  We discussed the whole issue of hormone replacement therapy to include the WHI study.  Risks versus benefits to include symptom relief, cardiovascular and bone health versus the risks of thrombosis particularly when initiating at her age as well as a risk of breast cancer.  Given her situation I do not feel initiating HRT is appropriate and she agrees with this.  Patient will have her DEXA scan sent to me as it is ordered through her primary physician's office and will follow-up with me if she has any other GYN issues or questions.  Greater than 50% of my time was spent in direct face to face counseling and coordination of care with the patient.     Anastasio Auerbach MD, 8:23 AM 01/30/2018

## 2018-05-28 ENCOUNTER — Encounter

## 2018-06-14 ENCOUNTER — Encounter: Payer: Self-pay | Admitting: Cardiology

## 2018-06-14 ENCOUNTER — Ambulatory Visit (INDEPENDENT_AMBULATORY_CARE_PROVIDER_SITE_OTHER): Payer: Medicare Other | Admitting: Cardiology

## 2018-06-14 VITALS — BP 128/72 | HR 75 | Ht 60.0 in | Wt 152.0 lb

## 2018-06-14 DIAGNOSIS — E785 Hyperlipidemia, unspecified: Secondary | ICD-10-CM

## 2018-06-14 DIAGNOSIS — R9431 Abnormal electrocardiogram [ECG] [EKG]: Secondary | ICD-10-CM

## 2018-06-14 DIAGNOSIS — I10 Essential (primary) hypertension: Secondary | ICD-10-CM

## 2018-06-14 NOTE — Patient Instructions (Signed)
Medication Instructions:   Your physician recommends that you continue on your current medications as directed. Please refer to the Current Medication list given to you today.  If you need a refill on your cardiac medications before your next appointment, please call your pharmacy.      Follow-Up: At [facility], you and your health needs are our priority.  As part of our continuing mission to provide you with exceptional heart care, we have created designated Provider Care Teams.  These Care Teams include your primary Cardiologist (physician) and Advanced Practice Providers (APPs -  Physician Assistants and Nurse Practitioners) who all work together to provide you with the care you need, when you need it. You will need a follow up appointment in 1 years.  Please call our office 2 months in advance to schedule this appointment.  You may see Katarina Nelson, MD or one of the following Advanced Practice Providers on your designated Care Team:   Brittainy Simmons, PA-C Dayna Dunn, PA-C . Michele Lenze, PA-C      

## 2018-06-14 NOTE — Progress Notes (Signed)
Patient ID: Marilyn Rivas, female   DOB: 04/23/1941, 77 y.o.   MRN: [id number]    Patient Name: Marilyn Rivas Date of Encounter: 06/14/2018  Primary Care Provider:  Willey Blade, MD Primary Cardiologist:  Ena Dawley  Problem List   Past Medical History:  Diagnosis Date  . Anxiety   . Arthritis    "left; maybe back" (01/13/2016)  . Cancer ([place])    skin pre-cancer  . Depression   . Diverticulitis   . Diverticulosis   . Family history of adverse reaction to anesthesia    "daughter gets really nauseous & has trouble getting intubated" (01/13/2016)  . GERD (gastroesophageal reflux disease)    HISTORY  . History of blood transfusion 1977   "related to OR"  . Hx of adenomatous colonic polyps   . Hyperlipidemia   . Hypothyroidism   . Lichen sclerosus 11/3566   Biopsy proven  . Menopause   . Osteopenia   . Pneumonia 1990s X 1; early 2000s X 1  . Seasonal allergies   . Thyroid disease    Past Surgical History:  Procedure Laterality Date  . ACHILLES TENDON SURGERY Right 04/25/2016   Procedure: ACHILLES TENDON REPAIR,PARTIAL EXCISION CALCANEOUS;  Surgeon: [name], MD;  Location: [facility];  Service: Orthopedics;  Laterality: Right;  . APPENDECTOMY  1977  . BACK SURGERY    . BLEPHAROPLASTY Bilateral   . CARDIAC CATHETERIZATION  ~ 2014  . CARPAL TUNNEL RELEASE Right   . CATARACT EXTRACTION W/ INTRAOCULAR LENS  IMPLANT, BILATERAL Bilateral   . CERVICAL FUSION  1999   C5-7  . DILATATION & CURETTAGE/HYSTEROSCOPY WITH TRUECLEAR N/A 05/02/2013   Procedure: DILATATION & CURETTAGE/HYSTEROSCOPY WITH TRUECLEAR ;  Surgeon: Anastasio Auerbach, MD;  Location: [facility];  Service: Gynecology;  Laterality: N/A;  . DILATION AND CURETTAGE OF UTERUS    . HARDWARE REMOVAL Left 12/12/2013   Procedure:  LEFT PATELLA HARDWARE REMOVAL;  Surgeon: [name], MD;  Location: [place];  Service: Orthopedics;  Laterality: Left;  . HYSTEROSCOPY     . JOINT REPLACEMENT    . KNEE ARTHROSCOPY Left 12/12/2013   Procedure: LEFT PATELLA ARTHROSCOPY KNEE WITH DEBRIDEMENT/SHAVING (CONDROPLASTY), LYSIS OF ADHESIONS;  Surgeon: [name], MD;  Location: [place];  Service: Orthopedics;  Laterality: Left;  . LEFT HEART CATHETERIZATION WITH CORONARY ANGIOGRAM N/A 04/22/2014   Procedure: LEFT HEART CATHETERIZATION WITH CORONARY ANGIOGRAM;  Surgeon: Sinclair Grooms, MD;  Location: [facility] C[facility] LAB;  Service: Cardiovascular;  Laterality: N/A;  . LUMBAR [place] SURGERY  2004   L5  . PARTIAL KNEE ARTHROPLASTY Left 01/13/2016   Procedure: LEFT UNICOMPARTMENTAL KNEE;  Surgeon: [name], MD;  Location: [place];  Service: Orthopedics;  Laterality: Left;  . PATELLA RECONSTRUCTION Left 1994  . REPLACEMENT UNICONDYLAR JOINT KNEE Left 01/13/2016  . RIGHT OOPHORECTOMY  1978  . TONSILLECTOMY     Allergies  Allergies  Allergen Reactions  . Aleve [Naproxen Sodium] Other (See Comments)    Other reaction(s): Other Severe abdominal pain  . Aspirin Other (See Comments)    Other reaction(s): Other severe abdominal pain and excessive salavation   . Atorvastatin Other (See Comments)    Other reaction(s): Myalgias (Muscle Pain) myalgia  . Clarithromycin Rash, Other (See Comments) and Diarrhea    Says allergic to "mycins"  . Crestor  [Rosuvastatin Calcium]     Other reaction(s): Other  . Durezol [Difluprednate] Other (See Comments)  Other reaction(s): Eye Redness Eye redness    . Olopatadine Hcl     Other reaction(s): Eye Redness  . Ondansetron     Other reaction(s): Other  . Penicillins Swelling, Rash, Other (See Comments) and Itching    Hands swelling Has patient had a PCN reaction causing immediate rash, facial/tongue/throat swelling, SOB or lightheadedness with hypotension: yes Has patient had a PCN reaction causing severe rash involving mucus membranes or skin necrosis: no Has patient had a PCN reaction that required  hospitalization no Has patient had a PCN reaction occurring within the last 10 years: no If all of the above answers are "NO", then may proceed with Cephalosporin use.    . Pitavastatin     Other reaction(s): Arthralgia (Joint Pain)  . Statins Other (See Comments)    Other reaction(s): Other (See Comments) MUSCLE CRAMPS Muscle cramps  . Crestor [Rosuvastatin] Other (See Comments)    Muscle aches  . Other Other (See Comments)    Paseo eye drops - causes eye redness  . Prednisone     headache  . Adhesive [Tape] Rash and Other (See Comments)    Looks burned  . Zofran [Ondansetron Hcl] Other (See Comments)    Headache    HPI  A very pleasant 77 year old female who has been followed for dyspnea on exertion and fatigue. The patient is generally very active she exercises twice a week in the gym and walks almost daily. She has smoked 30 years ago. Her father had myocardial infarction at age of 35 and underwent quadruple bypass and AAA repair at age of 72. Her daughter has a pacemaker for unknown reason. She underwent a cardiac catheterization in 2015 that showed normal coronaries despite abnormal stress test.   05/04/2017, this is one year follow-up, patient was hospitalized in May of this year with chest pain ruled out for ACS, based on normal coronary catheterization year ago no further ischemic workup was performed and patient responded to trial off PPIs. Since then she has been having on and off indigestion type of pain that are controlled by Prilosec she denies any shortness of breath, she feels stressed out with her family history she will otherwise is attending photography and portrait classes and enjoys her life. She has pain in her back and knees but denies lower extremity edema orthopnea dyspnea she is compliant with her medications.  06/14/2018 - 1 year follow up, no changes, feels great, able to exercise with no symptoms, tolerates meds well.  Home Medications  Prior to Admission  medications   Medication Sig Start Date End Date Taking? Authorizing Provider  acetaminophen (TYLENOL) 325 MG tablet Take 325 mg by mouth every 6 (six) hours as needed.   Yes Historical Provider, MD  Calcium Citrate-Vitamin D (CALCIUM CITRATE + PO) Take 4 capsules by mouth daily. Contain 800mg  calcium citrate and other vitamins D,C, K, Mg, zinc, copper   Yes Historical Provider, MD  Cholecalciferol (VITAMIN D3) 5000 UNITS TABS Take 1 tablet by mouth daily.   Yes Historical Provider, MD  diphenhydramine-acetaminophen (TYLENOL PM) 25-500 MG TABS Take 1 tablet by mouth at bedtime as needed.   Yes Historical Provider, MD  hyoscyamine (LEVSIN, ANASPAZ) 0.125 MG tablet Take 0.125 mg by mouth every 4 (four) hours as needed for cramping.   Yes Historical Provider, MD  levothyroxine (SYNTHROID, LEVOTHROID) 112 MCG tablet Take 1 tablet (112 mcg total) by mouth daily. 06/29/12  Yes Lanice Shirts, MD  omeprazole (PRILOSEC OTC) 20 MG tablet Take 20  mg by mouth daily.   Yes Historical Provider, MD  Probiotic Product (PROBIOTIC PO) Take 1-2 capsules by mouth as needed.    Yes Historical Provider, MD  rosuvastatin (CRESTOR) 5 MG tablet Take 5 mg by mouth 2 (two) times a week. 03/06/14  Yes Philemon Kingdom, MD    Family History  Family History  Problem Relation Age of Onset  . Allergies Mother   . Heart disease Father   . Colon cancer Neg Hx     Social History  Social History   Socioeconomic History  . Marital status: Divorced    Spouse name: Not on file  . Number of children: 2  . Years of education: Not on file  . Highest education level: Not on file  Occupational History  . Occupation: RESEARCH ASSIST    Employer: [employer]    Comment: Retired   Scientific laboratory technician  . Financial resource strain: Not on file  . Food insecurity:    Worry: Not on file    Inability: Not on file  . Transportation needs:    Medical: Not on file    Non-medical: Not on file  Tobacco Use  . Smoking  status: Former Smoker    Packs/day: 1.00    Years: 27.00    Pack years: 27.00    Types: Cigarettes    Last attempt to quit: 12/10/1983    Years since quitting: 34.5  . Smokeless tobacco: Never Used  Substance and Sexual Activity  . Alcohol use: Yes    Comment: 01/13/2016 "I'll have a drink < 2 times/month"  . Drug use: No  . Sexual activity: Never  Lifestyle  . Physical activity:    Days per week: Not on file    Minutes per session: Not on file  . Stress: Not on file  Relationships  . Social connections:    Talks on phone: Not on file    Gets together: Not on file    Attends religious service: Not on file    Active member of club or organization: Not on file    Attends meetings of clubs or organizations: Not on file    Relationship status: Not on file  . Intimate partner violence:    Fear of current or ex partner: Not on file    Emotionally abused: Not on file    Physically abused: Not on file    Forced sexual activity: Not on file  Other Topics Concern  . Not on file  Social History Narrative   Daily caffeine      Review of Systems, as per HPI, otherwise negative General:  No chills, fever, night sweats or weight changes.  Cardiovascular:  No chest pain, dyspnea on exertion, edema, orthopnea, palpitations, paroxysmal nocturnal dyspnea. Dermatological: No rash, lesions/masses Respiratory: No cough, dyspnea Urologic: No hematuria, dysuria Abdominal:   No nausea, vomiting, diarrhea, bright red blood per rectum, melena, or hematemesis Neurologic:  No visual changes, wkns, changes in mental status. All other systems reviewed and are otherwise negative except as noted above.  Physical Exam  Blood pressure (!) 142/84, pulse 75, height 5' (1.524 m), weight 152 lb (68.9 kg), SpO2 98 %.  General: Pleasant, NAD Psych: Normal affect. Neuro: Alert and oriented X 3. Moves all extremities spontaneously. HEENT: Normal  Neck: Supple without bruits or JVD. Lungs:  Resp regular and  unlabored, CTA. Heart: RRR no s3, s4, or murmurs. Abdomen: Soft, non-tender, non-distended, BS + x 4.  Extremities: No clubbing, cyanosis or edema.  DP/PT/Radials 2+ and equal bilaterally.  Labs:  No results for input(s): CKTOTAL, CKMB, TROPONINI in the last 72 hours. Lab Results  Component Value Date   WBC 7.8 07/07/2016   HGB 15.5 (H) 07/07/2016   HCT 44.9 07/07/2016   MCV 91.8 07/07/2016   PLT 260 07/07/2016    Lab Results  Component Value Date   DDIMER <0.27 07/07/2016   Invalid input(s): POCBNP    Component Value Date/Time   NA 142 07/08/2016 0500   K 4.0 07/08/2016 0500   CL 110 07/08/2016 0500   CO2 26 07/08/2016 0500   GLUCOSE 104 (H) 07/08/2016 0500   BUN 9 07/08/2016 0500   CREATININE 0.57 07/08/2016 0500   CREATININE 0.64 10/07/2014 1202   CALCIUM 9.4 07/08/2016 0500   PROT 6.7 10/07/2014 1202   ALBUMIN 4.4 10/07/2014 1202   AST 32 10/07/2014 1202   ALT 33 10/07/2014 1202   ALKPHOS 75 10/07/2014 1202   BILITOT 0.7 10/07/2014 1202   GFRNONAA >60 07/08/2016 0500   GFRAA >60 07/08/2016 0500   Lab Results  Component Value Date   CHOL 183 07/15/2014   HDL 50.70 07/15/2014   LDLCALC 107 (H) 07/15/2014   TRIG 126.0 07/15/2014    Accessory Clinical Findings  Echocardiogram - none  ECG - Personally reviewed on 05/04/2017 and shows normal sinus rhythm, diffuse negative T waves in inferior anterolateral leads, unchanged from prior.  Left cardiac cath: LEFT VENTRICULOGRAM: Left ventricular angiogram was done in the 30 RAO projection and revealed normal cavity size and EF 60%.  IMPRESSIONS: 1. Normal coronary arteries  2. Normal left ventricular systolic function with EF 60% and normal hemodynamics  3. Dyspnea not likely related to cardiac etiology  RECOMMENDATION: Management per primary team with other considerations being pulmonary sources of dyspnea, deconditioning, and sleep apnea.Marland Kitchen   ECG - SR, negative T waves in the inferior and anterolateral leads,  unchanged from prior in 2015   Assessment & Plan  1. Abnormal ECG - with diffuse negative T waves in inferior and anterolateral leads, abnormal stress test, however normal cath in 2015. EKG unchanged today, she is asymptomatic. No further ischemic workup.  2. Blood pressure - well controlled, repeat BP 128/80 mmHg.  3. Hyperlipidemia - LDL 180, significant muscle pain with rosuvastatin and livalo, she had a great response LDL 180 --> 107. We will verify again if she qualifies for PCSK 9 inhibitors.  Follow up in 1 year.     Ena Dawley, MD, [facility] 06/14/2018, 1:57 PM

## 2018-06-18 ENCOUNTER — Telehealth: Payer: Self-pay | Admitting: Pharmacist

## 2018-06-18 DIAGNOSIS — E782 Mixed hyperlipidemia: Secondary | ICD-10-CM

## 2018-06-18 DIAGNOSIS — Z789 Other specified health status: Secondary | ICD-10-CM

## 2018-06-18 DIAGNOSIS — E785 Hyperlipidemia, unspecified: Secondary | ICD-10-CM

## 2018-06-18 NOTE — Telephone Encounter (Signed)
2019 Insurance information for future if needed for PCSK9i therapy:  BIN [id number] PCN [id number] [id number] PDPIND ID [id number]

## 2018-06-18 NOTE — Telephone Encounter (Signed)
Marilyn Rivas, can you prescribe her zetia 10 mg po daily? If she agrees, repeat lipids in 4 weeks. Thank you, K

## 2018-06-18 NOTE — Telephone Encounter (Signed)
Pt with history of intolerance to several statin medications, most recently atorvastatin 10mg  daily, rosuvastatin 5mg  twice weekly, and Livalo 1mg  daily. She has been unable to tolerate all due to severe muscle pains. Dr. Meda Coffee request review for additional options.   LDL on most recent panel was 156 and is not at goal <100 for primary hyperlipidemia.   I do not see that she has tried ezetimibe 10mg  daily. Would recommend a trial of ezetimibe. If LDL not at goal on recheck in 2-3 months or unable to tolerate could possibly get approved for PCSK9i therapy for primary hyperlipidemia.

## 2018-06-18 NOTE — Telephone Encounter (Signed)
Thank you :)

## 2018-06-19 ENCOUNTER — Other Ambulatory Visit: Payer: Self-pay | Admitting: Cardiology

## 2018-06-19 DIAGNOSIS — Z789 Other specified health status: Secondary | ICD-10-CM | POA: Insufficient documentation

## 2018-06-19 DIAGNOSIS — E782 Mixed hyperlipidemia: Secondary | ICD-10-CM

## 2018-06-19 DIAGNOSIS — E785 Hyperlipidemia, unspecified: Secondary | ICD-10-CM

## 2018-06-19 MED ORDER — EZETIMIBE 10 MG PO TABS: 10.0000 mg | ORAL_TABLET | Freq: Every day | ORAL | 1 refills | Status: DC

## 2018-06-19 NOTE — Telephone Encounter (Signed)
Spoke with the pt and endorsed Dr Meda Coffee and our Pharmacist Georgina Peer Auten's recommendations for her to start zetia 10 mg po daily, and have lipids done in 4 weeks. Rationale for these recommendations explained to the pt.  Pt agreed to plan and agreed to come in for repeat lipids in 4 weeks on 07/17/18.  Pt aware to come fasting.  Pt verbalized understanding and agrees with this plan. Will send this information back to Dr Meda Coffee and Georgina Peer as an Juluis Rainier.

## 2018-06-28 ENCOUNTER — Encounter: Payer: Self-pay | Admitting: Gynecology

## 2018-07-17 ENCOUNTER — Other Ambulatory Visit: Payer: Self-pay

## 2018-08-03 ENCOUNTER — Other Ambulatory Visit: Payer: Medicare Other | Admitting: *Deleted

## 2018-08-03 DIAGNOSIS — E785 Hyperlipidemia, unspecified: Secondary | ICD-10-CM

## 2018-08-03 DIAGNOSIS — E782 Mixed hyperlipidemia: Secondary | ICD-10-CM

## 2018-08-03 DIAGNOSIS — Z789 Other specified health status: Secondary | ICD-10-CM

## 2018-08-03 LAB — LIPID PANEL
Chol/HDL Ratio: 3.9 ratio (ref 0.0–4.4)
Cholesterol, Total: 222 mg/dL — ABNORMAL HIGH (ref 100–199)
HDL: 57 mg/dL (ref >39)
LDL Calculated: 139 mg/dL — ABNORMAL HIGH (ref 0–99)
Triglycerides: 131 mg/dL (ref 0–149)
VLDL Cholesterol Cal: 26 mg/dL (ref 5–40)

## 2018-08-06 ENCOUNTER — Telehealth: Payer: Self-pay | Admitting: *Deleted

## 2018-08-06 DIAGNOSIS — E785 Hyperlipidemia, unspecified: Secondary | ICD-10-CM

## 2018-08-06 DIAGNOSIS — E782 Mixed hyperlipidemia: Secondary | ICD-10-CM

## 2018-08-06 DIAGNOSIS — Z789 Other specified health status: Secondary | ICD-10-CM

## 2018-08-06 NOTE — Telephone Encounter (Signed)
Spoke with the pt and endorsed to her, her lab results and recommendations per Dr Meda Coffee and Tana Coast PharmD, that based on her lipids still being elevated while on Zetia, they both recommend that she be referred our our [facility] lipid clinic, for she does qualify for PCSK9-Inhibitors.  Endorsed to the pt that I will place the referral in the system, and send a message to our schedulers to call her back and arrange this appt. Pt verbalized understanding and agrees with this plan.

## 2018-08-06 NOTE — Telephone Encounter (Signed)
-----   Message from Erskine Emery, [facility] sent at 08/06/2018  5:01 PM EST ----- Yes she should qualify now that she has tried ezetimibe. Can we have her scheduled in lipid clinic to discuss? Thanks!  ----- Message ----- From: Dorothy Spark, MD Sent: 08/03/2018   4:41 PM EST To: Erskine Emery, [facility]  Some improvement with zetia but still high, Georgina Peer does she qualify for PCSK 9? I see that you placed a note.

## 2018-08-07 ENCOUNTER — Other Ambulatory Visit: Payer: Self-pay

## 2018-08-20 ENCOUNTER — Telehealth: Payer: Self-pay | Admitting: *Deleted

## 2018-08-20 NOTE — Telephone Encounter (Signed)
"  I been referred to Dr. Amalia Hailey.  I'd like to go ahead and set up surgery with Dr. Amalia Hailey.  I am scheduled to see him on the 9th.  I'd like to do surgery immediately after that.

## 2018-08-24 NOTE — Telephone Encounter (Signed)
I am returning your call.  I cannot schedule your surgery until you see Dr. Amalia Hailey.  "I saw Dr. Jenne Campus and he said I needed surgery.  I have an appointment scheduled to see Dr. Amalia Hailey on January 6."  Yes, I understand but we cannot schedule you for surgery until you see Dr. Amalia Hailey.  "When is his next available date?"  He will not be able to do your surgery until February.  "You are telling me I will have to wait another month to have this done?  That is not acceptable.  My toe is killing me.  There's no way I can wait another month!  I'll just have to do something else."  I'm sorry, that is all he has available at this time."  (Patient hung up on me.)

## 2018-08-30 ENCOUNTER — Ambulatory Visit: Payer: Self-pay

## 2018-08-31 ENCOUNTER — Other Ambulatory Visit: Payer: Self-pay | Admitting: Internal Medicine

## 2018-08-31 ENCOUNTER — Ambulatory Visit
Admission: RE | Admit: 2018-08-31 | Discharge: 2018-08-31 | Disposition: A | Discharge status: Home / Self Care | Payer: Medicare Other | Source: Ambulatory Visit | Attending: Internal Medicine | Admitting: Internal Medicine

## 2018-08-31 DIAGNOSIS — R05 Cough: Secondary | ICD-10-CM

## 2018-08-31 DIAGNOSIS — R059 Cough, unspecified: Secondary | ICD-10-CM

## 2018-09-03 ENCOUNTER — Ambulatory Visit (INDEPENDENT_AMBULATORY_CARE_PROVIDER_SITE_OTHER): Payer: Medicare Other

## 2018-09-03 ENCOUNTER — Ambulatory Visit (INDEPENDENT_AMBULATORY_CARE_PROVIDER_SITE_OTHER): Payer: Medicare Other | Admitting: Podiatry

## 2018-09-03 ENCOUNTER — Encounter: Payer: Self-pay | Admitting: Podiatry

## 2018-09-03 VITALS — BP 150/84 | HR 70

## 2018-09-03 DIAGNOSIS — M2042 Other hammer toe(s) (acquired), left foot: Secondary | ICD-10-CM | POA: Diagnosis not present

## 2018-09-03 DIAGNOSIS — M898X7 Other specified disorders of bone, ankle and foot: Secondary | ICD-10-CM

## 2018-09-05 NOTE — Progress Notes (Signed)
   HPI: 78 year old female presenting today as a new patient referred by Dr. Gershon Mussel with a chief complaint of a painful corn in between the 4th and 5th toes of the left foot that appeared 3-4 months ago. Walking and wearing shoes increases the pain. She has tried trimming the corn and wearing wide fitting shoes for treatment. She was told by Dr. Gershon Mussel that her symptoms would need surgical intervention. Patient is here for further evaluation and treatment.   Past Medical History:  Diagnosis Date  . Anxiety   . Arthritis    "left; maybe back" (01/13/2016)  . Cancer ([place])    skin pre-cancer  . Depression   . Diverticulitis   . Diverticulosis   . Family history of adverse reaction to anesthesia    "daughter gets really nauseous & has trouble getting intubated" (01/13/2016)  . GERD (gastroesophageal reflux disease)    HISTORY  . History of blood transfusion 1977   "related to OR"  . Hx of adenomatous colonic polyps   . Hyperlipidemia   . Hypothyroidism   . Lichen sclerosus 11/1658   Biopsy proven  . Menopause   . Osteopenia   . Pneumonia 1990s X 1; early 2000s X 1  . Seasonal allergies   . Thyroid disease      Physical Exam: General: The patient is alert and oriented x3 in no acute distress.  Dermatology: Callus noted to the left fifth toe. Skin is warm, dry and supple bilateral lower extremities. Negative for open lesions or macerations.  Vascular: Palpable pedal pulses bilaterally. No edema or erythema noted. Capillary refill within normal limits.  Neurological: Epicritic and protective threshold grossly intact bilaterally.   Musculoskeletal Exam: Range of motion within normal limits to all pedal and ankle joints bilateral. Muscle strength 5/5 in all groups bilateral.   Radiographic Exam: (from Dr. Lindley Magnus office) Consistent with bone spur on medial aspect of 5th toe.     Assessment: 1. Exostosis left 5th toe with overlying callus    Plan of Care:  1. Patient evaluated. X-Rays  reviewed.  2. Today we discussed the conservative versus surgical management of the presenting pathology. The patient opts for surgical management. All possible complications and details of the procedure were explained. All patient questions were answered. No guarantees were expressed or implied. 3. Authorization for surgery was initiated today. Surgery will consist of exostectomy left fifth toe.  4. Continue wearing wide fitting shoes.  5. Return to clinic morning of surgery in office.      Edrick Kins, DPM [facility]  Dr. Edrick Kins, DPM    [address], [place] [address]                Office [phone]  Fax [phone]

## 2018-09-11 ENCOUNTER — Ambulatory Visit (INDEPENDENT_AMBULATORY_CARE_PROVIDER_SITE_OTHER): Payer: Medicare Other | Admitting: Pharmacist

## 2018-09-11 DIAGNOSIS — E785 Hyperlipidemia, unspecified: Secondary | ICD-10-CM

## 2018-09-11 NOTE — Progress Notes (Signed)
Patient ID: Marilyn Rivas                 DOB: May 05, 1941                    MRN: [id number]     HPI: Marilyn Rivas is a 78 y.o. female patient referred to lipid clinic by Meda Coffee. PMH is significant for HTN, HLD and hypothyroidism.   Current Medications: none Intolerances: atorvastatin 10mg  daily, rosuvastatin 5mg  twice weekly, and Livalo 1mg  daily (muscle/joint pain), zetia 10mg  daily (ineffective), fenofibrate 54mg  daily (ineffective) Risk Factors: ASCVD 10 year risk 21.8%, elevated LDL-P, elevated ApoB LDL goal: <100, LDL-P <1000  Diet: no beef, little cheese, plant based diet, very little meat, nut butters, avocados, bean  Exercise: unable to exercise vigorously due to nerve pain, but does walk  Family History: Father MI at 59  Social History: quite smoking 30 years ago  Labs:08/03/18 TC 22, TG 131, HDL 57, LDL 139 (zetia 10mg  daily) 04/12/18 LDL 156 ApoB 149 LDL-P 2227 (no therapy)  Past Medical History:  Diagnosis Date  . Anxiety   . Arthritis    "left; maybe back" (01/13/2016)  . Cancer ([place])    skin pre-cancer  . Depression   . Diverticulitis   . Diverticulosis   . Family history of adverse reaction to anesthesia    "daughter gets really nauseous & has trouble getting intubated" (01/13/2016)  . GERD (gastroesophageal reflux disease)    HISTORY  . History of blood transfusion 1977   "related to OR"  . Hx of adenomatous colonic polyps   . Hyperlipidemia   . Hypothyroidism   . Lichen sclerosus 0/8657   Biopsy proven  . Menopause   . Osteopenia   . Pneumonia 1990s X 1; early 2000s X 1  . Seasonal allergies   . Thyroid disease     Current Outpatient Medications on File Prior to Visit  Medication Sig Dispense Refill  . acetaminophen (TYLENOL) 500 MG tablet Take 1,000 mg by mouth every morning.    . B Complex Vitamins (VITAMIN B-COMPLEX PO) Take by mouth daily.    . Biotin w/ Vitamins C & E (HAIR/SKIN/NAILS PO) Take 1 tablet by mouth daily.    Marland Kitchen  CALCIUM PO Take by mouth daily.    . Cholecalciferol (VITAMIN D3) 5000 UNITS TABS Take 1 tablet by mouth every Monday, Wednesday, and Friday.     . diphenhydramine-acetaminophen (TYLENOL PM) 25-500 MG TABS tablet Take 1-2 tablets by mouth at bedtime as needed (For pain or sleep.).    Marland Kitchen ezetimibe (ZETIA) 10 MG tablet TAKE 1 TABLET(10 MG) BY MOUTH DAILY 90 tablet 3  . fenofibrate 54 MG tablet Take 1 tablet by mouth daily.  1  . hyoscyamine (LEVSIN/SL) 0.125 MG SL tablet Place 1 tablet (0.125 mg total) under the tongue every 4 (four) hours as needed. (Patient taking differently: Place 0.125 mg under the tongue every 4 (four) hours as needed for cramping. ) 120 tablet 2  . Multiple Vitamin (MULTIVITAMIN) tablet Take 1 tablet by mouth daily.    . Probiotic Product (PROBIOTIC PO) Take 1-2 capsules by mouth as needed (for GI support).     . SYNTHROID 25 MCG tablet Take 25 mcg by mouth daily before breakfast.     . thyroid (ARMOUR) 90 MG tablet Take 90 mg by mouth daily.    . TURMERIC PO Take 1 capsule by mouth daily.     Current Facility-Administered Medications on File Prior  to Visit  Medication Dose Route Frequency Provider Last Rate Last Dose  . 0.9 %  sodium chloride infusion  500 mL Intravenous Continuous Irene Shipper, MD      . chlorhexidine (HIBICLENS) 4 % liquid 4 application  60 mL Topical Once Aundra Dubin, PA-C      . chlorhexidine (HIBICLENS) 4 % liquid 4 application  60 mL Topical Once Dwana Melena L, PA-C      . lactated ringers infusion   Intravenous Continuous Aundra Dubin, PA-C        Allergies  Allergen Reactions  . Aleve [Naproxen Sodium] Other (See Comments)    Other reaction(s): Other Severe abdominal pain  . Aspirin Other (See Comments)    Other reaction(s): Other severe abdominal pain and excessive salavation   . Atorvastatin Other (See Comments)    Other reaction(s): Myalgias (Muscle Pain) myalgia  . Clarithromycin Rash, Other (See Comments) and Diarrhea     Says allergic to "mycins"  . Crestor  [Rosuvastatin Calcium]     Other reaction(s): Other  . Durezol [Difluprednate] Other (See Comments)    Other reaction(s): Eye Redness Eye redness    . Olopatadine Hcl     Other reaction(s): Eye Redness  . Ondansetron     Other reaction(s): Other  . Penicillins Swelling, Rash, Other (See Comments) and Itching    Hands swelling Has patient had a PCN reaction causing immediate rash, facial/tongue/throat swelling, SOB or lightheadedness with hypotension: yes Has patient had a PCN reaction causing severe rash involving mucus membranes or skin necrosis: no Has patient had a PCN reaction that required hospitalization no Has patient had a PCN reaction occurring within the last 10 years: no If all of the above answers are "NO", then may proceed with Cephalosporin use.    . Pitavastatin     Other reaction(s): Arthralgia (Joint Pain)  . Statins Other (See Comments)    Other reaction(s): Other (See Comments) MUSCLE CRAMPS Muscle cramps  . Crestor [Rosuvastatin] Other (See Comments)    Muscle aches  . Other Other (See Comments)    Paseo eye drops - causes eye redness  . Prednisone     headache  . Adhesive [Tape] Rash and Other (See Comments)    Looks burned  . Zofran [Ondansetron Hcl] Other (See Comments)    Headache     Assessment/Plan:  1. Hyperlipidemia - Patient LDL is not at goal of <100. Patients LDL-P and LDL-C ratio is disproportionate meaning patient particle size is small and she has a large amount of particle. With an LDL-P of 2227 and ApoB of 149, she is at increased risk of arthrosclerosis. Since she is unable to tolerate statins and zetia provided very little LDL lowering, patient is a good candidate for PCSK9 inhibitors. She has a ASCVD risk of 21.8%, an added increased risk of ASCVD due to advanced lipid panel results, and family history of CAD. Therefore, cholesterol should be treated more aggressively. She was educated on proper  injection technique and PA process. We will submit PA and await response. If unable to get Praluent or Repatha approved, we discussed Bempedoic acid, which will be coming to the market in a few months. Patient is concerned about cost. We will do our best to get patient assistance if it is too expensive, however patient assistance this year has been difficult to obtain.   Thank you,  Ramond Dial, Pharm.D, [place]  [address], [place]  [id number]  Phone: [phone]; Fax: [phone]

## 2018-09-11 NOTE — Patient Instructions (Addendum)
I will submit a prior authorization for Praluent or Repatha. I will call you once I hear back from the insurance company. If they deny it, we will submit an appeal.   Bempedoic acid is another medication coming to the market. We will prusue this, if we cannot get Repatha or Praluent approved.  Call us at [phone] with any questions or concerns

## 2018-09-12 ENCOUNTER — Ambulatory Visit (INDEPENDENT_AMBULATORY_CARE_PROVIDER_SITE_OTHER): Payer: Medicare Other | Admitting: Podiatry

## 2018-09-12 DIAGNOSIS — M898X7 Other specified disorders of bone, ankle and foot: Secondary | ICD-10-CM

## 2018-09-12 DIAGNOSIS — M2042 Other hammer toe(s) (acquired), left foot: Secondary | ICD-10-CM

## 2018-09-12 NOTE — Progress Notes (Signed)
Patient presented this morning for an office procedure exostectomy of the fifth toe left foot.  Upon presentation the patient was very dissatisfied with communication issues within our office apparently.  After coming in discussing with the patient and reassuring her about the procedure this morning I went ahead and performed a digital block to the fifth digit left foot in preparation for the ensuing procedure.  Patient was transferred to the procedure room and prepped in the standard sterile manner.  While draping the patient's foot, my assistant helped to lift her extremity while draping was performed.  My assistant lifted the posterior calf/pants leg, which was an unsterile unprepped area.  Immediately the patient was very disgruntled that my assistant did not use gloves.  I tried to explain to the patient that her pants leg there is unsterile and not a part of the surgical field.  I explained that my assistant was simply trying to aid in draping of the extremity.  Patient stated that she was very uncomfortable and I immediately canceled the procedure.  Sterile prep and drape were immediately removed and the patient left very disgruntled despite multiple efforts to resolve her issues.  Edrick Kins, DPM [facility]  Dr. Edrick Kins, DPM    [address], [place] [address]                Office [phone]  Fax [phone]

## 2018-09-18 ENCOUNTER — Telehealth: Payer: Self-pay | Admitting: Pharmacist

## 2018-09-18 NOTE — Telephone Encounter (Signed)
Initial PA for Praluent was denied. Called pt to inform her that we are working on [employer] data [employer]. Should submit within the next few days. Will be in contact about results. Hopeful that if we cannot get approved that Bempedoic acid will be released soon and we will have other options

## 2018-09-20 ENCOUNTER — Encounter: Payer: Self-pay | Admitting: Pharmacist

## 2018-09-24 ENCOUNTER — Telehealth: Payer: Self-pay | Admitting: Pharmacist

## 2018-09-24 MED ORDER — ALIROCUMAB 75 MG/ML ~~LOC~~ SOAJ: 1.0000 "pen " | SUBCUTANEOUS | 11 refills | Status: DC

## 2018-09-24 NOTE — Addendum Note (Signed)
Addended by: Shacoria Latif E on: 09/24/2018 10:33 AM   Modules accepted: Orders

## 2018-09-24 NOTE — Telephone Encounter (Addendum)
Appeals overturned - Praluent has been approved. Rx sent to local pharmacy to determine copay. Do not see insurance card on file.

## 2018-09-24 NOTE — Telephone Encounter (Signed)
Copay cost prohibitive - $400 deductible so first month supply is $634, subsequent copays close to $200 per month.  Provided pt with [employer] # to see if they can help with copay. If not, pt would like to try bempedoic acid once it is FDA approved next month.

## 2018-09-24 NOTE — Telephone Encounter (Signed)
Follow UP;     Please call, question about the mail she is supposed to be taking please

## 2018-09-24 NOTE — Addendum Note (Signed)
Addended by: Carena Stream E on: 09/24/2018 08:15 AM   Modules accepted: Orders

## 2018-10-22 ENCOUNTER — Telehealth: Payer: Self-pay | Admitting: Cardiology

## 2018-10-22 NOTE — Telephone Encounter (Signed)
Returned call to pt - she needed clarification on income/household #. Provided pt again with # for [facility] as she has not tried reaching out to them yet and they have been quicker with assistance. She will call them and if denied, then will return PASS application to clinic.

## 2018-10-22 NOTE — Telephone Encounter (Signed)
New Message   PT is calling because she received the enrollment form for the past program but she needs help filling it out. Its for the drug Pralunt  Please call back

## 2018-10-29 ENCOUNTER — Telehealth: Payer: Self-pay | Admitting: Cardiology

## 2018-10-29 NOTE — Telephone Encounter (Signed)
Patient called stating she received approval for the medication Praluent.  She wants to know what her next step needs to be, if someone can please give her a call back.

## 2018-10-30 MED ORDER — ALIROCUMAB 75 MG/ML ~~LOC~~ SOAJ: 75.0000 mg | SUBCUTANEOUS | 11 refills | Status: DC

## 2018-10-30 NOTE — Telephone Encounter (Signed)
Follow up   Patient is following up to see how to get her praluent per the prior message. Please advise

## 2018-10-30 NOTE — Telephone Encounter (Signed)
Spoke with patient who is upset that she did not receive call back yesterday and that it is so challenging to get a hold of our office. I answered her questions regarding PCSK9i therapy, including questions around [employer] (which she was approved for $2500). She would like to come to the office to do first injection. She again is upset about need to call to schedule, but did provide direct line. She is aware to pick up medication from pharmacy and will call to come to office for first injection.

## 2018-11-02 ENCOUNTER — Telehealth: Payer: Self-pay | Admitting: Cardiology

## 2018-11-02 DIAGNOSIS — E782 Mixed hyperlipidemia: Secondary | ICD-10-CM

## 2018-11-02 DIAGNOSIS — E785 Hyperlipidemia, unspecified: Secondary | ICD-10-CM

## 2018-11-02 DIAGNOSIS — Z789 Other specified health status: Secondary | ICD-10-CM

## 2018-11-02 NOTE — Telephone Encounter (Signed)
Pharmacy, pt is inquiring when she will need follow-up lipids/labs for her Praluent she is taking. Please advise.

## 2018-11-02 NOTE — Telephone Encounter (Signed)
Please order CMP and lipids at her earliest convenience.

## 2018-11-02 NOTE — Telephone Encounter (Signed)
Spoke with the pt and informed her of Dr Francesca Oman recommendation to recheck her lipids/cmet at her earliest convenience.  Pt endorsed that would probably be too soon, for she just took her 1st dose/injection of Praluent today, for her PCP taught her how to use it while she was there for an OV.  Confirmed with our [place], and she advised that being the pt just administered her first injection today, she will be due for recheck of her lipids/cmet on the week of April 6-10.  Endorsed this back to the pt, and scheduled her to come in for repeat lipids and cmet on 12/04/2018.  Pt is aware to come fasting to this lab appt.  Pt verbalized understanding and agrees with this plan.

## 2018-11-02 NOTE — Telephone Encounter (Signed)
New message   Pt c/o medication issue:  1. Name of Medication: praulent   2. How are you currently taking this medication (dosage and times per day)? 1 time every 14 days   3. Are you having a reaction (difficulty breathing--STAT)? No   4. What is your medication issue? Patient wants to know when a cholesterol test will be set up for this medication? Please advise.

## 2018-12-04 ENCOUNTER — Other Ambulatory Visit: Payer: Medicare Other

## 2019-01-28 ENCOUNTER — Telehealth: Payer: Self-pay

## 2019-01-28 NOTE — Telephone Encounter (Signed)
    COVID-19 Pre-Screening Questions:  . In the past 7 to 10 days have you had a cough,  shortness of breath, headache, congestion, fever (100 or greater) body aches, chills, sore throat, or sudden loss of taste or sense of smell? . Have you been around anyone with known Covid 19. . Have you been around anyone who is awaiting Covid 19 test results in the past 7 to 10 days? . Have you been around anyone who has been exposed to Covid 19, or has mentioned symptoms of Covid 19 within the past 7 to 10 days?  If you have any concerns/questions about symptoms patients report during screening (either on the phone or at [facility]). Contact the provider seeing the patient or DOD for further guidance.  If neither are available contact a member of the leadership team.   Pt answered NO to all pre-screening questions. [id number] 01/28/2019

## 2019-01-29 ENCOUNTER — Other Ambulatory Visit: Payer: Medicare Other

## 2019-01-29 ENCOUNTER — Other Ambulatory Visit: Payer: Self-pay

## 2019-01-29 DIAGNOSIS — E785 Hyperlipidemia, unspecified: Secondary | ICD-10-CM

## 2019-01-29 DIAGNOSIS — E782 Mixed hyperlipidemia: Secondary | ICD-10-CM

## 2019-01-29 DIAGNOSIS — Z789 Other specified health status: Secondary | ICD-10-CM

## 2019-01-29 LAB — LIPID PANEL
Chol/HDL Ratio: 2.7 ratio (ref 0.0–4.4)
Cholesterol, Total: 143 mg/dL (ref 100–199)
HDL: 53 mg/dL (ref >39)
LDL Calculated: 62 mg/dL (ref 0–99)
Triglycerides: 139 mg/dL (ref 0–149)
VLDL Cholesterol Cal: 28 mg/dL (ref 5–40)

## 2019-01-29 LAB — COMPREHENSIVE METABOLIC PANEL
ALT: 25 IU/L (ref 0–32)
AST: 27 IU/L (ref 0–40)
Albumin/Globulin Ratio: 2.9 — ABNORMAL HIGH (ref 1.2–2.2)
Albumin: 4.7 g/dL (ref 3.7–4.7)
Alkaline Phosphatase: 90 IU/L (ref 39–117)
BUN/Creatinine Ratio: 14 (ref 12–28)
BUN: 11 mg/dL (ref 8–27)
Bilirubin Total: 0.6 mg/dL (ref 0.0–1.2)
CO2: 25 mmol/L (ref 20–29)
Calcium: 9.7 mg/dL (ref 8.7–10.3)
Chloride: 102 mmol/L (ref 96–106)
Creatinine, Ser: 0.76 mg/dL (ref 0.57–1.00)
GFR calc Af Amer: 87 mL/min/{1.73_m2} (ref >59)
GFR calc non Af Amer: 75 mL/min/{1.73_m2} (ref >59)
Globulin, Total: 1.6 g/dL (ref 1.5–4.5)
Glucose: 97 mg/dL (ref 65–99)
Potassium: 4.4 mmol/L (ref 3.5–5.2)
Sodium: 144 mmol/L (ref 134–144)
Total Protein: 6.3 g/dL (ref 6.0–8.5)

## 2019-05-31 ENCOUNTER — Telehealth: Payer: Self-pay | Admitting: Cardiology

## 2019-05-31 NOTE — Telephone Encounter (Signed)
Pt is very angry and frustrated for she can't get an appt with Dr Meda Coffee until the first of the new year.  Pt states she was pushed back from covid, is on Praluent, and will need to be seen before the new year, to have this medication re-approved.  Pt states this is extremely upsetting for her that she received a letter in the mail to schedule her appt for Oct 2020 and nothing is available.  Scheduled the pt to see Dr. Francesca Oman team APP Marilyn Copa PA-C for 10/15 at 2:45 pm.  Informed the pt that Dayna is delightful, very thorough, and is on Dr. Francesca Oman care team.  Pt agreed to this appt date and time, and was very pleased that this service was offered.  Pt states her frustrations are now better and she appreciates getting her in to see someone.  Pt verbalized understanding and agrees with this plan.

## 2019-05-31 NOTE — Telephone Encounter (Signed)
New Message  Patient is calling in to schedule her annual visit. Patient refuses an appointment with anyone other than Dr. Meda Coffee. Explained that she has no appointments available to schedule. Patient is requested to speak with Dr. Meda Coffee or her nurse. Please give patient a call back.

## 2019-06-06 ENCOUNTER — Encounter: Payer: Self-pay | Admitting: Gynecology

## 2019-06-10 ENCOUNTER — Encounter: Payer: Self-pay | Admitting: Physician Assistant

## 2019-06-10 NOTE — Progress Notes (Signed)
Cardiology Office Note:    Date:  06/13/2019   ID:  Marilyn Rivas, DOB 10/26/1940, MRN [id number]  PCP:  Willey Blade, MD  Cardiologist:  Ena Dawley, MD  Electrophysiologist:  None   Referring MD: Willey Blade, MD   Chief Complaint: follow-up of abnormal EKG and hyperlipidemia  History of Present Illness:    Marilyn Rivas is a 78 y.o. female with a history of normal coronaries on cardiac catheterization in 2015, hyperlipidemia, previous tobacco use, hypothyroidism, anxiety, depression, and family history of heart disease who is followed by Dr. Meda Coffee and presents today for follow-up.  Patient had abnormal stress test in 2015 but was found to have normal coronary arteries on cardiac catheterization at that time. She was hospitalized in 12/2016 for chest pain. Ruled out for MI. Given normal coronaries on recent cath, no further ischemic work-up was performed. Patient responded to trial of PPIs. Patient was last seen by Dr. Meda Coffee in 05/2018 at which time she was doing well with no cardiac symptoms. Most recent LDL at that time was 156. She has been unable to tolerate several statins in the past due to severe muscle pains. Therefore, she was started on trial of Zetia. Repeat lipid panel in 07/2018 showed only minimal improvement with LDL of 139. Patient was approved for and started on Praluent. Most recent lipid panel in 01/2019 showed LDL of 62.  Patient presents today for follow-up. Patient doing relatively well. She does report to falls within the last 2 weeks. One was clearly a mechanical fall where she stood up from the table and her feet got caught up in the chair/table and she fell on her shoulder. Second time she walking outside with her grandson. They had been walking for about 3 miles (which she thinks is too far for her) when she started to feel tired and felt like she was starting to lean forward. She reached for a mailbox and just slipped and fell. She denies  any palpitations, lightheadedness, dizziness, or syncope. She is very concerned that she has fallen twice in such a short amount of time. She denies any chest pain. She notes some mild dyspnea with activities around the house such as vacuuming or moving boxes. She is not sure if this is new for her because she states her activity level has significantly diminished during the pandemic. She denies any orthopnea, PND, or lower extremity edema. Patient notes chronic right lateral leg pain that she has previous been told is due to a nerve problem (but not sciatica). No claudication.   Past Medical History:  Diagnosis Date   Anxiety    Arthritis    "left; maybe back" (01/13/2016)   Cancer ([place])    skin pre-cancer   Depression    Diverticulitis    Diverticulosis    Family history of adverse reaction to anesthesia    "daughter gets really nauseous & has trouble getting intubated" (01/13/2016)   GERD (gastroesophageal reflux disease)    HISTORY   History of blood transfusion 1977   "related to OR"   Hx of adenomatous colonic polyps    Hyperlipidemia    Hypothyroidism    Lichen sclerosus [id number]   Biopsy proven   Menopause    Osteopenia    Pneumonia 1990s X 1; early 2000s X 1   Seasonal allergies     Past Surgical History:  Procedure Laterality Date   ACHILLES TENDON SURGERY Right 04/25/2016   Procedure: ACHILLES TENDON REPAIR,PARTIAL EXCISION CALCANEOUS;  Surgeon: Quillian Quince  Dennie Bible, MD;  Location: [facility];  Service: Orthopedics;  Laterality: Right;   APPENDECTOMY  1977   BACK SURGERY     BLEPHAROPLASTY Bilateral    CARDIAC CATHETERIZATION  ~ 2014   CARPAL TUNNEL RELEASE Right    CATARACT EXTRACTION W/ INTRAOCULAR LENS  IMPLANT, BILATERAL Bilateral    CERVICAL FUSION  1999   C5-7   DILATATION & CURETTAGE/HYSTEROSCOPY WITH TRUECLEAR N/A 05/02/2013   Procedure: DILATATION & CURETTAGE/HYSTEROSCOPY WITH TRUECLEAR ;  Surgeon: Anastasio Auerbach, MD;   Location: [facility];  Service: Gynecology;  Laterality: N/A;   DILATION AND CURETTAGE OF UTERUS     HARDWARE REMOVAL Left 12/12/2013   Procedure:  LEFT PATELLA HARDWARE REMOVAL;  Surgeon: [name], MD;  Location: [facility];  Service: Orthopedics;  Laterality: Left;   HYSTEROSCOPY     JOINT REPLACEMENT     KNEE ARTHROSCOPY Left 12/12/2013   Procedure: LEFT PATELLA ARTHROSCOPY KNEE WITH DEBRIDEMENT/SHAVING (CONDROPLASTY), LYSIS OF ADHESIONS;  Surgeon: [name], MD;  Location: [facility];  Service: Orthopedics;  Laterality: Left;   LEFT HEART CATHETERIZATION WITH CORONARY ANGIOGRAM N/A 04/22/2014   Procedure: LEFT HEART CATHETERIZATION WITH CORONARY ANGIOGRAM;  Surgeon: Sinclair Grooms, MD;  Location: [facility] C[facility];  Service: Cardiovascular;  Laterality: N/A;   LUMBAR DISC SURGERY  2004   L5   PARTIAL KNEE ARTHROPLASTY Left 01/13/2016   Procedure: LEFT UNICOMPARTMENTAL KNEE;  Surgeon: [name], MD;  Location: [facility];  Service: Orthopedics;  Laterality: Left;   PATELLA RECONSTRUCTION Left 1994   REPLACEMENT UNICONDYLAR JOINT KNEE Left 01/13/2016   RIGHT OOPHORECTOMY  1978   TONSILLECTOMY      Current Medications: Current Meds  Medication Sig   acetaminophen (TYLENOL) 500 MG tablet Take 1,000 mg by mouth as needed.    Alirocumab (PRALUENT) 75 MG/ML SOAJ Inject 75 mg into the skin every 14 (fourteen) days.   B Complex Vitamins (VITAMIN B-COMPLEX PO) Take by mouth daily.   Biotin w/ Vitamins C & E (HAIR/SKIN/NAILS PO) Take 1 tablet by mouth daily.   buPROPion (WELLBUTRIN SR) 100 MG 12 hr tablet Take 50 mg by mouth daily.   CALCIUM PO Take by mouth daily.   Cholecalciferol (VITAMIN D3) 5000 UNITS TABS Take 1 tablet by mouth every Monday, Wednesday, and Friday.    hyoscyamine (LEVSIN/SL) 0.125 MG SL tablet Place 1 tablet (0.125 mg total) under the tongue every 4 (four) hours as needed.   Lifitegrast (XIIDRA OP) Apply to eye 2 (two)  times daily. For dry eyes   Multiple Vitamin (MULTIVITAMIN) tablet Take 1 tablet by mouth daily.   Probiotic Product (PROBIOTIC PO) Take 1-2 capsules by mouth as needed (for GI support).    SYNTHROID 25 MCG tablet Take 25 mcg by mouth daily before breakfast.    thyroid (ARMOUR) 90 MG tablet Take 90 mg by mouth daily.   TURMERIC PO Take 1 capsule by mouth daily.   Current Facility-Administered Medications for the 06/13/19 encounter (Office Visit) with Darreld Mclean, PA-C  Medication   0.9 %  sodium chloride infusion     Allergies:   Aleve [naproxen sodium], Aspirin, Atorvastatin, Clarithromycin, Crestor  [rosuvastatin calcium], Durezol [difluprednate], Olopatadine hcl, Ondansetron, Penicillins, Pitavastatin, Statins, Crestor [rosuvastatin], Other, Prednisone, Adhesive [tape], and Zofran [ondansetron hcl]   Social History   Socioeconomic History   Marital status: Divorced    Spouse name: Not on file   Number of children: 2  Years of education: Not on file   Highest education level: Not on file  Occupational History   Occupation: RESEARCH ASSIST    Employer: [employer]: Retired   Scientist, product/process development strain: Not on file   Food insecurity    Worry: Not on file    Inability: Not on Lexicographer needs    Medical: Not on file    Non-medical: Not on file  Tobacco Use   Smoking status: Former Smoker    Packs/day: 1.00    Years: 27.00    Pack years: 27.00    Types: Cigarettes    Quit date: 12/10/1983    Years since quitting: 35.5   Smokeless tobacco: Never Used  Substance and Sexual Activity   Alcohol use: Yes    Comment: 01/13/2016 "I'll have a drink < 2 times/month"   Drug use: No   Sexual activity: Never  Lifestyle   Physical activity    Days per week: Not on file    Minutes per session: Not on file   Stress: Not on file  Relationships   Social connections    Talks on phone: Not on file    Gets  together: Not on file    Attends religious service: Not on file    Active member of club or organization: Not on file    Attends meetings of clubs or organizations: Not on file    Relationship status: Not on file  Other Topics Concern   Not on file  Social History Narrative   Daily caffeine      Family History: The patient's family history includes Allergies in her mother; Heart disease in her father. There is no history of Colon cancer.  ROS:   Please see the history of present illness.    All other systems reviewed and are negative.  EKGs/Labs/Other Studies Reviewed:    The following studies were reviewed today:  Echocardiogram 04/21/2014: Study Conclusions: - Left ventricle: The cavity size was normal. Wall thickness was  normal. Systolic function was normal. The estimated ejection  fraction was in the range of 60% to 65%. Doppler parameters are  consistent with abnormal left ventricular relaxation (grade 1  diastolic dysfunction).  - Aortic valve: There was mild regurgitation.  - Left atrium: The atrium was mildly dilated.   EKG:  EKG ordered today. EKG personally reviewed and demonstrates normal sinus rhythm, rate 73 bpm, with LVH and chronic ST depression and T wave inversions in leads V3-V6 that have been seen on tracings going back to 2013.  Recent Labs: 01/29/2019: ALT 25; BUN 11; Creatinine, Ser 0.76; Potassium 4.4; Sodium 144  Recent Lipid Panel    Component Value Date/Time   CHOL 143 01/29/2019 0859   TRIG 139 01/29/2019 0859   HDL 53 01/29/2019 0859   CHOLHDL 2.7 01/29/2019 0859   CHOLHDL 4 07/15/2014 0835   VLDL 25.2 07/15/2014 0835   LDLCALC 62 01/29/2019 0859   LDLDIRECT 206.1 08/01/2007 0917    Physical Exam:    Vital Signs: BP 140/70    Pulse 73    Ht 5' (1.524 m)    Wt 158 lb 6.4 oz (71.8 kg)    SpO2 97%    BMI 30.94 kg/m     Wt Readings from Last 3 Encounters:  06/13/19 158 lb 6.4 oz (71.8 kg)  06/14/18 152 lb (68.9 kg)  05/04/17 166  lb (75.3 kg)     General: 78  y.o. obese Caucasian female in no acute distress. HEENT: Normocephalic and atraumatic. Sclera clear. Neck: Supple. No carotid bruits. No JVD. Heart: RRR. Distinct S1 and S2. No murmurs, gallops, or rubs. Radial and posterior tibial pulses 2+ and equal bilaterally. Lungs: No increased work of breathing. Clear to ausculation bilaterally. No wheezes, rhonchi, or rales.  Abdomen: Soft, non-distended, and non-tender to palpation. Bowel sounds present. MSK: Normal strength and tone for age. Extremities: No lower extremity edema.    Skin: Warm and dry. Neuro: Alert and oriented x3. No focal deficits. Psych: Normal affect. Responds appropriately.  Assessment:    1. Abnormal EKG   2. Essential hypertension   3. Hyperlipidemia, unspecified hyperlipidemia type   4. DOE (dyspnea on exertion)   5. Aortic valve insufficiency, etiology of cardiac valve disease unspecified   6. Fall, initial encounter      Plan:    Abnormal EKG Patient has chronic ST depression and T wave inversions in leads V3-V6 as well as some minimal ST depression and T wave flattening in inferior leads. These tracings have been seen on tracing going back to 2010. She had abnormal stress test in 2015 but cardiac catheterization at that time showed normal coronaries.  - No angina since last visit.  - Continue BP and cholesterol control.    Hypertension - BP mildly elevated at 140/70. I personally rechecked this and confirmed this reading. Patient states BP is always elevated when she comes to our office. However, she states it is normal in the 120's/60's when she goes to her PCP.  - Patient does not want to start any medications at this time. She would like to try lifestyle modifications first. Discussed importance of exercise and diet (information given in AVS). - Also recommended patient get an automatic BP cuff and monitor BP periodically. - Will follow-up with patient in 2-3 months to recheck.    Hyperlipidemia - Most recent lipid panel from 01/2019: Total Cholesterol 143, Triglycerides 139, HDL 53, LDL 62. - Patient intolerant to multiple statins in the past.  - Continue Praluent.  - Repeat lipid panel and LFTs in 01/2020.  Mild Dyspnea - Patient reports mild dyspnea with light activities around the home such as vacuuming and moving boxes. No other CHF symptoms. Appears euvolemic one exam.  - Last Echo from 2015 showed LVEF of 60-65% with grade 1 diastolic dysfunction and mild aortic regurgitation.  - Will go ahead and repeat Echo for further evaluation given mild dyspnea, recent falls, and mild aortic regurgitation noted on prior Echo. - Will also check CBC and BMET today.   Mild Aortic Regurgitation - Noted to have mild aortic regurgitation on Echo in 2015. - Will recheck Echo.  Falls - Patient has had 2 falls over the last 2 weeks. See HPI for more detail. Does not sound concerning for cardiac etiology. Sounds mechanical. However, patient very concerned. Therefore, will check Echo as stated above. No cardiac symptoms prior to fall and no syncope. If Echo is normal, do not think any additional cardiac work-up is needed at this time.   Hypothyroidism - TSH last checked in 12/2018 at PCP's office and was reportedly normal (not in our system or [facility]).  - On Synthroid. - Will recheck TSH today.   Disposition: Follow up in 2-3 months with APP for BP check.    Medication Adjustments/Labs and Tests Ordered: Current medicines are reviewed at length with the patient today.  Concerns regarding medicines are outlined above.  Orders Placed This Encounter  Procedures   Basic metabolic panel   CBC   TSH   EKG 12-Lead   ECHOCARDIOGRAM COMPLETE   No orders of the defined types were placed in this encounter.   Patient Instructions  Medication Instructions:  Your physician recommends that you continue on your current medications as directed. Please refer to the Current  Medication list given to you today.  *If you need a refill on your cardiac medications before your next appointment, please call your pharmacy*  Lab Work: TODAY:  BMET, CBC, & TSH  If you have labs (blood work) drawn today and your tests are completely normal, you will receive your results only by:  [place] (if you have MyChart) OR  A paper copy in the mail If you have any lab test that is abnormal or we need to change your treatment, we will call you to review the results.  Testing/Procedures: Your physician has requested that you have an echocardiogram 06/18/2019 ARRIVE AT 2:50 FOR THIS TEST.  Echocardiography is a painless test that uses sound waves to create images of your heart. It provides your doctor with information about the size and shape of your heart and how well your hearts chambers and valves are working. This procedure takes approximately one hour. There are no restrictions for this procedure.    Follow-Up: At [facility], you and your health needs are our priority.  As part of our continuing mission to provide you with exceptional heart care, we have created designated Provider Care Teams.  These Care Teams include your primary Cardiologist (physician) and Advanced Practice Providers (APPs -  Physician Assistants and Nurse Practitioners) who all work together to provide you with the care you need, when you need it.  Your next appointment:   08/13/2019 ARRIVE AT 11:30 TO SEE DAYNA Idolina Primer, PA-C    The format for your next appointment:   In Person  Provider:   You may see Ena Dawley, MD or one of the following Advanced Practice Providers on your designated Care Team:    Melina Copa, PA-C  Ermalinda Barrios, PA-C   Other Instructions  Please get a ARM BLOOD PRESSURE CUFF.  Monitor your blood pressure and keep a log and bring that with you to your next follow-up appointment.    DASH Eating Plan DASH stands for "Dietary Approaches to Stop Hypertension."  The DASH eating plan is a healthy eating plan that has been shown to reduce high blood pressure (hypertension). It may also reduce your risk for type 2 diabetes, heart disease, and stroke. The DASH eating plan may also help with weight loss. What are tips for following this plan?  General guidelines  Avoid eating more than 2,300 mg (milligrams) of salt (sodium) a day. If you have hypertension, you may need to reduce your sodium intake to 1,500 mg a day.  Limit alcohol intake to no more than 1 drink a day for nonpregnant women and 2 drinks a day for men. One drink equals 12 oz of beer, 5 oz of wine, or 1 oz of hard liquor.  Work with your health care provider to maintain a healthy body weight or to lose weight. Ask what an ideal weight is for you.  Get at least 30 minutes of exercise that causes your heart to beat faster (aerobic exercise) most days of the week. Activities may include walking, swimming, or biking.  Work with your health care provider or diet and nutrition specialist (dietitian) to adjust your eating plan to your  individual calorie needs. Reading food labels   Check food labels for the amount of sodium per serving. Choose foods with less than 5 percent of the Daily Value of sodium. Generally, foods with less than 300 mg of sodium per serving fit into this eating plan.  To find whole grains, look for the word "whole" as the first word in the ingredient list. Shopping  Buy products labeled as "low-sodium" or "no salt added."  Buy fresh foods. Avoid canned foods and premade or frozen meals. Cooking  Avoid adding salt when cooking. Use salt-free seasonings or herbs instead of table salt or sea salt. Check with your health care provider or pharmacist before using salt substitutes.  Do not fry foods. Cook foods using healthy methods such as baking, boiling, grilling, and broiling instead.  Cook with heart-healthy oils, such as olive, canola, soybean, or sunflower oil. Meal  planning  Eat a balanced diet that includes: ? 5 or more servings of fruits and vegetables each day. At each meal, try to fill half of your plate with fruits and vegetables. ? Up to 6-8 servings of whole grains each day. ? Less than 6 oz of lean meat, poultry, or fish each day. A 3-oz serving of meat is about the same size as a deck of cards. One egg equals 1 oz. ? 2 servings of low-fat dairy each day. ? A serving of nuts, seeds, or beans 5 times each week. ? Heart-healthy fats. Healthy fats called Omega-3 fatty acids are found in foods such as flaxseeds and coldwater fish, like sardines, salmon, and mackerel.  Limit how much you eat of the following: ? Canned or prepackaged foods. ? Food that is high in trans fat, such as fried foods. ? Food that is high in saturated fat, such as fatty meat. ? Sweets, desserts, sugary drinks, and other foods with added sugar. ? Full-fat dairy products.  Do not salt foods before eating.  Try to eat at least 2 vegetarian meals each week.  Eat more home-cooked food and less restaurant, buffet, and fast food.  When eating at a restaurant, ask that your food be prepared with less salt or no salt, if possible. What foods are recommended? The items listed may not be a complete list. Talk with your dietitian about what dietary choices are best for you. Grains Whole-grain or whole-wheat bread. Whole-grain or whole-wheat pasta. Brown rice. [place]. Bulgur. Whole-grain and low-sodium cereals. Pita bread. Low-fat, low-sodium crackers. Whole-wheat flour tortillas. Vegetables Fresh or frozen vegetables (raw, steamed, roasted, or grilled). Low-sodium or reduced-sodium tomato and vegetable juice. Low-sodium or reduced-sodium tomato sauce and tomato paste. Low-sodium or reduced-sodium canned vegetables. Fruits All fresh, dried, or frozen fruit. Canned fruit in natural juice (without added sugar). Meat and other protein foods Skinless chicken or [place].  Ground chicken or [place]. Pork with fat trimmed off. Fish and seafood. Egg whites. Dried beans, peas, or lentils. Unsalted nuts, nut butters, and seeds. Unsalted canned beans. Lean cuts of beef with fat trimmed off. Low-sodium, lean deli meat. Dairy Low-fat (1%) or fat-free (skim) milk. Fat-free, low-fat, or reduced-fat cheeses. Nonfat, low-sodium ricotta or cottage cheese. Low-fat or nonfat yogurt. Low-fat, low-sodium cheese. Fats and oils Soft margarine without trans fats. Vegetable oil. Low-fat, reduced-fat, or light mayonnaise and salad dressings (reduced-sodium). Canola, safflower, olive, soybean, and sunflower oils. Avocado. Seasoning and other foods Herbs. Spices. Seasoning mixes without salt. Unsalted popcorn and pretzels. Fat-free sweets. What foods are not recommended? The items listed may not be  a complete list. Talk with your dietitian about what dietary choices are best for you. Grains Baked goods made with fat, such as croissants, muffins, or some breads. Dry pasta or rice meal packs. Vegetables Creamed or fried vegetables. Vegetables in a cheese sauce. Regular canned vegetables (not low-sodium or reduced-sodium). Regular canned tomato sauce and paste (not low-sodium or reduced-sodium). Regular tomato and vegetable juice (not low-sodium or reduced-sodium). Angie Fava. Olives. Fruits Canned fruit in a light or heavy syrup. Fried fruit. Fruit in cream or butter sauce. Meat and other protein foods Fatty cuts of meat. Ribs. Fried meat. Berniece Salines. Sausage. Bologna and other processed lunch meats. Salami. Fatback. Hotdogs. Bratwurst. Salted nuts and seeds. Canned beans with added salt. Canned or smoked fish. Whole eggs or egg yolks. Chicken or [place] with skin. Dairy Whole or 2% milk, cream, and half-and-half. Whole or full-fat cream cheese. Whole-fat or sweetened yogurt. Full-fat cheese. Nondairy creamers. Whipped toppings. Processed cheese and cheese spreads. Fats and oils Butter. Stick  margarine. Lard. Shortening. Ghee. Bacon fat. Tropical oils, such as coconut, palm kernel, or palm oil. Seasoning and other foods Salted popcorn and pretzels. Onion salt, garlic salt, seasoned salt, table salt, and sea salt. Worcestershire sauce. Tartar sauce. Barbecue sauce. Teriyaki sauce. Soy sauce, including reduced-sodium. Steak sauce. Canned and packaged gravies. Fish sauce. Oyster sauce. Cocktail sauce. Horseradish that you find on the shelf. Ketchup. Mustard. Meat flavorings and tenderizers. Bouillon cubes. Hot sauce and Tabasco sauce. Premade or packaged marinades. Premade or packaged taco seasonings. Relishes. Regular salad dressings. Where to find more information:  National Heart, Lung, and [place]: https://wilson-eaton.com/  American Heart Association: www.heart.org Summary  The DASH eating plan is a healthy eating plan that has been shown to reduce high blood pressure (hypertension). It may also reduce your risk for type 2 diabetes, heart disease, and stroke.  With the DASH eating plan, you should limit salt (sodium) intake to 2,300 mg a day. If you have hypertension, you may need to reduce your sodium intake to 1,500 mg a day.  When on the DASH eating plan, aim to eat more fresh fruits and vegetables, whole grains, lean proteins, low-fat dairy, and heart-healthy fats.  Work with your health care provider or diet and nutrition specialist (dietitian) to adjust your eating plan to your individual calorie needs. This information is not intended to replace advice given to you by your health care provider. Make sure you discuss any questions you have with your health care provider. Document Released: 08/04/2011 Document Revised: 07/28/2017 Document Reviewed: 08/08/2016 Elsevier Patient Education  2020 [employer].    Exercising to Stay Healthy To become healthy and stay healthy, it is recommended that you do moderate-intensity and vigorous-intensity exercise. You can tell that you  are exercising at a moderate intensity if your heart starts beating faster and you start breathing faster but can still hold a conversation. You can tell that you are exercising at a vigorous intensity if you are breathing much harder and faster and cannot hold a conversation while exercising. Exercising regularly is important. It has many health benefits, such as:  Improving overall fitness, flexibility, and endurance.  Increasing bone density.  Helping with weight control.  Decreasing body fat.  Increasing muscle strength.  Reducing stress and tension.  Improving overall health. How often should I exercise? Choose an activity that you enjoy, and set realistic goals. Your health care provider can help you make an activity plan that works for you. Exercise regularly as told by your health  care provider. This may include:  Doing strength training two times a week, such as: ? Lifting weights. ? Using resistance bands. ? Push-ups. ? Sit-ups. ? Yoga.  Doing a certain intensity of exercise for a given amount of time. Choose from these options: ? A total of 150 minutes of moderate-intensity exercise every week. ? A total of 75 minutes of vigorous-intensity exercise every week. ? A mix of moderate-intensity and vigorous-intensity exercise every week. Children, pregnant women, people who have not exercised regularly, people who are overweight, and older adults may need to talk with a health care provider about what activities are safe to do. If you have a medical condition, be sure to talk with your health care provider before you start a new exercise program. What are some exercise ideas? Moderate-intensity exercise ideas include:  Walking 1 mile (1.6 km) in about 15 minutes.  Biking.  Hiking.  Golfing.  Dancing.  Water aerobics. Vigorous-intensity exercise ideas include:  Walking 4.5 miles (7.2 km) or more in about 1 hour.  Jogging or running 5 miles (8 km) in about 1  hour.  Biking 10 miles (16.1 km) or more in about 1 hour.  Lap swimming.  Roller-skating or in-line skating.  Cross-country skiing.  Vigorous competitive sports, such as football, basketball, and soccer.  Jumping rope.  Aerobic dancing. What are some everyday activities that can help me to get exercise?  [place] work, such as: ? Pushing a Conservation officer, nature. ? Raking and bagging leaves.  Washing your car.  Pushing a stroller.  Shoveling snow.  Gardening.  Washing windows or floors. How can I be more active in my day-to-day activities?  Use stairs instead of an elevator.  Take a walk during your lunch break.  If you drive, park your car farther away from your work or school.  If you take public transportation, get off one stop early and walk the rest of the way.  Stand up or walk around during all of your indoor phone calls.  Get up, stretch, and walk around every 30 minutes throughout the day.  Enjoy exercise with a friend. Support to continue exercising will help you keep a regular routine of activity. What guidelines can I follow while exercising?  Before you start a new exercise program, talk with your health care provider.  Do not exercise so much that you hurt yourself, feel dizzy, or get very short of breath.  Wear comfortable clothes and wear shoes with good support.  Drink plenty of water while you exercise to prevent dehydration or heat stroke.  Work out until your breathing and your heartbeat get faster. Where to find more information  U.S. Department of Health and Human Services: BondedCompany.at  Centers for Disease Control and Prevention (CDC): http://www.wolf.info/ Summary  Exercising regularly is important. It will improve your overall fitness, flexibility, and endurance.  Regular exercise also will improve your overall health. It can help you control your weight, reduce stress, and improve your bone density.  Do not exercise so much that you hurt yourself,  feel dizzy, or get very short of breath.  Before you start a new exercise program, talk with your health care provider. This information is not intended to replace advice given to you by your health care provider. Make sure you discuss any questions you have with your health care provider. Document Released: 09/17/2010 Document Revised: 07/28/2017 Document Reviewed: 07/06/2017 Elsevier Patient Education  2020 [place], Darreld Mclean, [place]  06/13/2019  4:22 PM    [facility]

## 2019-06-13 ENCOUNTER — Ambulatory Visit (INDEPENDENT_AMBULATORY_CARE_PROVIDER_SITE_OTHER): Payer: Medicare Other | Admitting: Student

## 2019-06-13 ENCOUNTER — Other Ambulatory Visit: Payer: Self-pay

## 2019-06-13 ENCOUNTER — Encounter: Payer: Self-pay | Admitting: Student

## 2019-06-13 VITALS — BP 140/70 | HR 73 | Ht 60.0 in | Wt 158.4 lb

## 2019-06-13 DIAGNOSIS — I10 Essential (primary) hypertension: Secondary | ICD-10-CM | POA: Diagnosis not present

## 2019-06-13 DIAGNOSIS — R0600 Dyspnea, unspecified: Secondary | ICD-10-CM | POA: Diagnosis not present

## 2019-06-13 DIAGNOSIS — W19XXXA Unspecified fall, initial encounter: Secondary | ICD-10-CM

## 2019-06-13 DIAGNOSIS — E785 Hyperlipidemia, unspecified: Secondary | ICD-10-CM

## 2019-06-13 DIAGNOSIS — I351 Nonrheumatic aortic (valve) insufficiency: Secondary | ICD-10-CM

## 2019-06-13 DIAGNOSIS — R9431 Abnormal electrocardiogram [ECG] [EKG]: Secondary | ICD-10-CM

## 2019-06-13 DIAGNOSIS — R0609 Other forms of dyspnea: Secondary | ICD-10-CM

## 2019-06-13 NOTE — Patient Instructions (Addendum)
Medication Instructions:  Your physician recommends that you continue on your current medications as directed. Please refer to the Current Medication list given to you today.  *If you need a refill on your cardiac medications before your next appointment, please call your pharmacy*  Lab Work: TODAY:  BMET, CBC, & TSH  If you have labs (blood work) drawn today and your tests are completely normal, you will receive your results only by: Marland Kitchen MyChart Message (if you have MyChart) OR . A paper copy in the mail If you have any lab test that is abnormal or we need to change your treatment, we will call you to review the results.  Testing/Procedures: Your physician has requested that you have an echocardiogram 06/18/2019 ARRIVE AT 2:50 FOR THIS TEST.  Echocardiography is a painless test that uses sound waves to create images of your heart. It provides your doctor with information about the size and shape of your heart and how well your heart's chambers and valves are working. This procedure takes approximately one hour. There are no restrictions for this procedure.    Follow-Up: At [facility], you and your health needs are our priority.  As part of our continuing mission to provide you with exceptional heart care, we have created designated Provider Care Teams.  These Care Teams include your primary Cardiologist (physician) and Advanced Practice Providers (APPs -  Physician Assistants and Nurse Practitioners) who all work together to provide you with the care you need, when you need it.  Your next appointment:   08/13/2019 ARRIVE AT 11:30 TO SEE DAYNA Idolina Primer, PA-C    The format for your next appointment:   In Person  Provider:   You may see Ena Dawley, MD or one of the following Advanced Practice Providers on your designated Care Team:    Melina Copa, PA-C  Ermalinda Barrios, PA-C   Other Instructions  Please get a ARM BLOOD PRESSURE CUFF.  Monitor your blood pressure and keep a log and  bring that with you to your next follow-up appointment.    DASH Eating Plan DASH stands for "Dietary Approaches to Stop Hypertension." The DASH eating plan is a healthy eating plan that has been shown to reduce high blood pressure (hypertension). It may also reduce your risk for type 2 diabetes, heart disease, and stroke. The DASH eating plan may also help with weight loss. What are tips for following this plan?  General guidelines  Avoid eating more than 2,300 mg (milligrams) of salt (sodium) a day. If you have hypertension, you may need to reduce your sodium intake to 1,500 mg a day.  Limit alcohol intake to no more than 1 drink a day for nonpregnant women and 2 drinks a day for men. One drink equals 12 oz of beer, 5 oz of wine, or 1 oz of hard liquor.  Work with your health care provider to maintain a healthy body weight or to lose weight. Ask what an ideal weight is for you.  Get at least 30 minutes of exercise that causes your heart to beat faster (aerobic exercise) most days of the week. Activities may include walking, swimming, or biking.  Work with your health care provider or diet and nutrition specialist (dietitian) to adjust your eating plan to your individual calorie needs. Reading food labels   Check food labels for the amount of sodium per serving. Choose foods with less than 5 percent of the Daily Value of sodium. Generally, foods with less than 300 mg of  sodium per serving fit into this eating plan.  To find whole grains, look for the word "whole" as the first word in the ingredient list. Shopping  Buy products labeled as "low-sodium" or "no salt added."  Buy fresh foods. Avoid canned foods and premade or frozen meals. Cooking  Avoid adding salt when cooking. Use salt-free seasonings or herbs instead of table salt or sea salt. Check with your health care provider or pharmacist before using salt substitutes.  Do not fry foods. Cook foods using healthy methods such as  baking, boiling, grilling, and broiling instead.  Cook with heart-healthy oils, such as olive, canola, soybean, or sunflower oil. Meal planning  Eat a balanced diet that includes: ? 5 or more servings of fruits and vegetables each day. At each meal, try to fill half of your plate with fruits and vegetables. ? Up to 6-8 servings of whole grains each day. ? Less than 6 oz of lean meat, poultry, or fish each day. A 3-oz serving of meat is about the same size as a deck of cards. One egg equals 1 oz. ? 2 servings of low-fat dairy each day. ? A serving of nuts, seeds, or beans 5 times each week. ? Heart-healthy fats. Healthy fats called Omega-3 fatty acids are found in foods such as flaxseeds and coldwater fish, like sardines, salmon, and mackerel.  Limit how much you eat of the following: ? Canned or prepackaged foods. ? Food that is high in trans fat, such as fried foods. ? Food that is high in saturated fat, such as fatty meat. ? Sweets, desserts, sugary drinks, and other foods with added sugar. ? Full-fat dairy products.  Do not salt foods before eating.  Try to eat at least 2 vegetarian meals each week.  Eat more home-cooked food and less restaurant, buffet, and fast food.  When eating at a restaurant, ask that your food be prepared with less salt or no salt, if possible. What foods are recommended? The items listed may not be a complete list. Talk with your dietitian about what dietary choices are best for you. Grains Whole-grain or whole-wheat bread. Whole-grain or whole-wheat pasta. Brown rice. [place]. Bulgur. Whole-grain and low-sodium cereals. Pita bread. Low-fat, low-sodium crackers. Whole-wheat flour tortillas. Vegetables Fresh or frozen vegetables (raw, steamed, roasted, or grilled). Low-sodium or reduced-sodium tomato and vegetable juice. Low-sodium or reduced-sodium tomato sauce and tomato paste. Low-sodium or reduced-sodium canned vegetables. Fruits All fresh,  dried, or frozen fruit. Canned fruit in natural juice (without added sugar). Meat and other protein foods Skinless chicken or [place]. Ground chicken or [place]. Pork with fat trimmed off. Fish and seafood. Egg whites. Dried beans, peas, or lentils. Unsalted nuts, nut butters, and seeds. Unsalted canned beans. Lean cuts of beef with fat trimmed off. Low-sodium, lean deli meat. Dairy Low-fat (1%) or fat-free (skim) milk. Fat-free, low-fat, or reduced-fat cheeses. Nonfat, low-sodium ricotta or cottage cheese. Low-fat or nonfat yogurt. Low-fat, low-sodium cheese. Fats and oils Soft margarine without trans fats. Vegetable oil. Low-fat, reduced-fat, or light mayonnaise and salad dressings (reduced-sodium). Canola, safflower, olive, soybean, and sunflower oils. Avocado. Seasoning and other foods Herbs. Spices. Seasoning mixes without salt. Unsalted popcorn and pretzels. Fat-free sweets. What foods are not recommended? The items listed may not be a complete list. Talk with your dietitian about what dietary choices are best for you. Grains Baked goods made with fat, such as croissants, muffins, or some breads. Dry pasta or rice meal packs. Vegetables Creamed or fried vegetables.  Vegetables in a cheese sauce. Regular canned vegetables (not low-sodium or reduced-sodium). Regular canned tomato sauce and paste (not low-sodium or reduced-sodium). Regular tomato and vegetable juice (not low-sodium or reduced-sodium). Angie Fava. Olives. Fruits Canned fruit in a light or heavy syrup. Fried fruit. Fruit in cream or butter sauce. Meat and other protein foods Fatty cuts of meat. Ribs. Fried meat. Berniece Salines. Sausage. Bologna and other processed lunch meats. Salami. Fatback. Hotdogs. Bratwurst. Salted nuts and seeds. Canned beans with added salt. Canned or smoked fish. Whole eggs or egg yolks. Chicken or [place] with skin. Dairy Whole or 2% milk, cream, and half-and-half. Whole or full-fat cream cheese. Whole-fat or sweetened  yogurt. Full-fat cheese. Nondairy creamers. Whipped toppings. Processed cheese and cheese spreads. Fats and oils Butter. Stick margarine. Lard. Shortening. Ghee. Bacon fat. Tropical oils, such as coconut, palm kernel, or palm oil. Seasoning and other foods Salted popcorn and pretzels. Onion salt, garlic salt, seasoned salt, table salt, and sea salt. Worcestershire sauce. Tartar sauce. Barbecue sauce. Teriyaki sauce. Soy sauce, including reduced-sodium. Steak sauce. Canned and packaged gravies. Fish sauce. Oyster sauce. Cocktail sauce. Horseradish that you find on the shelf. Ketchup. Mustard. Meat flavorings and tenderizers. Bouillon cubes. Hot sauce and Tabasco sauce. Premade or packaged marinades. Premade or packaged taco seasonings. Relishes. Regular salad dressings. Where to find more information:  National Heart, Lung, and [place]: https://wilson-eaton.com/  American Heart Association: www.heart.org Summary  The DASH eating plan is a healthy eating plan that has been shown to reduce high blood pressure (hypertension). It may also reduce your risk for type 2 diabetes, heart disease, and stroke.  With the DASH eating plan, you should limit salt (sodium) intake to 2,300 mg a day. If you have hypertension, you may need to reduce your sodium intake to 1,500 mg a day.  When on the DASH eating plan, aim to eat more fresh fruits and vegetables, whole grains, lean proteins, low-fat dairy, and heart-healthy fats.  Work with your health care provider or diet and nutrition specialist (dietitian) to adjust your eating plan to your individual calorie needs. This information is not intended to replace advice given to you by your health care provider. Make sure you discuss any questions you have with your health care provider. Document Released: 08/04/2011 Document Revised: 07/28/2017 Document Reviewed: 08/08/2016 Elsevier Patient Education  2020 [employer].    Exercising to Stay Healthy To become  healthy and stay healthy, it is recommended that you do moderate-intensity and vigorous-intensity exercise. You can tell that you are exercising at a moderate intensity if your heart starts beating faster and you start breathing faster but can still hold a conversation. You can tell that you are exercising at a vigorous intensity if you are breathing much harder and faster and cannot hold a conversation while exercising. Exercising regularly is important. It has many health benefits, such as:  Improving overall fitness, flexibility, and endurance.  Increasing bone density.  Helping with weight control.  Decreasing body fat.  Increasing muscle strength.  Reducing stress and tension.  Improving overall health. How often should I exercise? Choose an activity that you enjoy, and set realistic goals. Your health care provider can help you make an activity plan that works for you. Exercise regularly as told by your health care provider. This may include:  Doing strength training two times a week, such as: ? Lifting weights. ? Using resistance bands. ? Push-ups. ? Sit-ups. ? Yoga.  Doing a certain intensity of exercise for a given amount  of time. Choose from these options: ? A total of 150 minutes of moderate-intensity exercise every week. ? A total of 75 minutes of vigorous-intensity exercise every week. ? A mix of moderate-intensity and vigorous-intensity exercise every week. Children, pregnant women, people who have not exercised regularly, people who are overweight, and older adults may need to talk with a health care provider about what activities are safe to do. If you have a medical condition, be sure to talk with your health care provider before you start a new exercise program. What are some exercise ideas? Moderate-intensity exercise ideas include:  Walking 1 mile (1.6 km) in about 15 minutes.  Biking.  Hiking.  Golfing.  Dancing.  Water aerobics. Vigorous-intensity  exercise ideas include:  Walking 4.5 miles (7.2 km) or more in about 1 hour.  Jogging or running 5 miles (8 km) in about 1 hour.  Biking 10 miles (16.1 km) or more in about 1 hour.  Lap swimming.  Roller-skating or in-line skating.  Cross-country skiing.  Vigorous competitive sports, such as football, basketball, and soccer.  Jumping rope.  Aerobic dancing. What are some everyday activities that can help me to get exercise?  [place] work, such as: ? Pushing a Conservation officer, nature. ? Raking and bagging leaves.  Washing your car.  Pushing a stroller.  Shoveling snow.  Gardening.  Washing windows or floors. How can I be more active in my day-to-day activities?  Use stairs instead of an elevator.  Take a walk during your lunch break.  If you drive, park your car farther away from your work or school.  If you take public transportation, get off one stop early and walk the rest of the way.  Stand up or walk around during all of your indoor phone calls.  Get up, stretch, and walk around every 30 minutes throughout the day.  Enjoy exercise with a friend. Support to continue exercising will help you keep a regular routine of activity. What guidelines can I follow while exercising?  Before you start a new exercise program, talk with your health care provider.  Do not exercise so much that you hurt yourself, feel dizzy, or get very short of breath.  Wear comfortable clothes and wear shoes with good support.  Drink plenty of water while you exercise to prevent dehydration or heat stroke.  Work out until your breathing and your heartbeat get faster. Where to find more information  U.S. Department of Health and Human Services: BondedCompany.at  Centers for Disease Control and Prevention (CDC): http://www.wolf.info/ Summary  Exercising regularly is important. It will improve your overall fitness, flexibility, and endurance.  Regular exercise also will improve your overall health. It can  help you control your weight, reduce stress, and improve your bone density.  Do not exercise so much that you hurt yourself, feel dizzy, or get very short of breath.  Before you start a new exercise program, talk with your health care provider. This information is not intended to replace advice given to you by your health care provider. Make sure you discuss any questions you have with your health care provider. Document Released: 09/17/2010 Document Revised: 07/28/2017 Document Reviewed: 07/06/2017 Elsevier Patient Education  2020 [employer].

## 2019-06-14 ENCOUNTER — Telehealth: Payer: Self-pay

## 2019-06-14 LAB — CBC
Hematocrit: 44.5 % (ref 34.0–46.6)
Hemoglobin: 15.7 g/dL (ref 11.1–15.9)
MCH: 31.6 pg (ref 26.6–33.0)
MCHC: 35.3 g/dL (ref 31.5–35.7)
MCV: 90 fL (ref 79–97)
Platelets: 322 10*3/uL (ref 150–450)
RBC: 4.97 x10E6/uL (ref 3.77–5.28)
RDW: 12.5 % (ref 11.7–15.4)
WBC: 7.9 10*3/uL (ref 3.4–10.8)

## 2019-06-14 LAB — BASIC METABOLIC PANEL
BUN/Creatinine Ratio: 21 (ref 12–28)
BUN: 13 mg/dL (ref 8–27)
CO2: 24 mmol/L (ref 20–29)
Calcium: 10.2 mg/dL (ref 8.7–10.3)
Chloride: 106 mmol/L (ref 96–106)
Creatinine, Ser: 0.62 mg/dL (ref 0.57–1.00)
GFR calc Af Amer: 99 mL/min/{1.73_m2} (ref >59)
GFR calc non Af Amer: 86 mL/min/{1.73_m2} (ref >59)
Glucose: 86 mg/dL (ref 65–99)
Potassium: 4.2 mmol/L (ref 3.5–5.2)
Sodium: 144 mmol/L (ref 134–144)

## 2019-06-14 LAB — TSH: TSH: 0.218 u[IU]/mL — ABNORMAL LOW (ref 0.450–4.500)

## 2019-06-14 NOTE — Telephone Encounter (Signed)
The patient has been notified of the result and verbalized understanding.  All questions (if any) were answered. Wilma Flavin, RN 06/14/2019 11:08 AM

## 2019-06-18 ENCOUNTER — Ambulatory Visit (HOSPITAL_COMMUNITY): Payer: Medicare Other | Attending: Cardiology

## 2019-06-18 ENCOUNTER — Other Ambulatory Visit: Payer: Self-pay

## 2019-06-18 DIAGNOSIS — R0600 Dyspnea, unspecified: Secondary | ICD-10-CM | POA: Diagnosis not present

## 2019-06-18 DIAGNOSIS — R0609 Other forms of dyspnea: Secondary | ICD-10-CM

## 2019-06-18 DIAGNOSIS — I351 Nonrheumatic aortic (valve) insufficiency: Secondary | ICD-10-CM | POA: Diagnosis not present

## 2019-08-12 ENCOUNTER — Encounter: Payer: Self-pay | Admitting: Physician Assistant

## 2019-08-12 NOTE — Progress Notes (Addendum)
Cardiology Office Note    Date:  08/13/2019   ID:  Marilyn Rivas, DOB [birth year], MRN [id number]  PCP:  Willey Blade, MD  Cardiologist:  Ena Dawley, MD  Electrophysiologist:  None   Chief Complaint: f/u BP  History of Present Illness:   Marilyn Rivas is a 78 y.o. female with history of mild aortic insufficiency, anxiety, arthritis, depression, diverticulosis, GERD, hypothyroidism, hyperlipidemia, lichen sclerosis, and former tobacco abuse who presents for follow-up of blood pressure.  She was remotely followed by Dr. Verl Blalock but more recently by Dr. Meda Coffee. She was initially referred by OBGYN for management of her lipids. Her father had myocardial infarction around age of 75 and underwent bypass surgery. He also AAA repair at age of 67. Her daughter has a pacemaker. The patient believes this was related to issues with narcotic use. Marilyn Rivas underwent a cardiac catheterization in 2015 for abnormal stress test that showed normal coronary arteries. Since that time she's been followed for risk stratification. She has followed with our lipid clinic for h/o statin intolerances (atorvastatin 10mg  daily, rosuvastatin 5mg  twice weekly, Livalo 1mg  daily causing muscle/joint pain, zetia 10mg  daily being ineffective, fenofibrate 54mg  daily being ineffective). She has a history of disproportionate LDL-P and LDL-C ratio conferring increased risk of arthrosclerosis. She has since been placed on Praluent. She saw Sande Rives PA-C 06/13/19 for routine follow-up at which time she reported 2 episodes of falling. One was clearly a mechanical fall where she stood up from the table and her feet got caught up in the chair/table and she fell on her shoulder. The second time occurred when she was walking outside on a hot day with her grandson. They had been walking for about 3 miles (which she felt was too far for her) when she started to feel tired and felt like she was starting to lean  forward. She reached for a mailbox and just slipped and fell. She denied any palpitations, lightheadedness, dizziness, or syncope. 2D echo 06/18/19 showed EF 60-65%, mildly increased LVH, indeterminate LV diastolic filling, mild AI.  Her blood pressure was mildly elevated at that visit. The patient did not wish to start any new medications, but was asked to monitor and follow-up. Last labs 05/2019 showed suppressed TSH, normal CBC, normal BMET with Cr 0.62 and K 4.2, 01/2019 LDL 62, LFTs wnl.  She returns for follow-up today. Her blood pressure remains mildly elevated. She has seen home readings from [id number] systolic range. Her thyroid medication was down-titrated by PCP and she has been started on Cymbalta. She denies any unusual fatigue. She has not had any recurrent falls and otherwise offers no new complaints today. She inquires about starting diuretic therapy for her hypertension. She cut down salt in her diet and walks 20 minutes per day.   Past Medical History:  Diagnosis Date  . Anxiety   . Arthritis    "left; maybe back" (01/13/2016)  . Cancer ([place])    skin pre-cancer  . Depression   . Diverticulitis   . Diverticulosis   . Family history of adverse reaction to anesthesia    "daughter gets really nauseous & has trouble getting intubated" (01/13/2016)  . GERD (gastroesophageal reflux disease)    HISTORY  . History of blood transfusion 1977   "related to OR"  . Hx of adenomatous colonic polyps   . Hyperlipidemia   . Hypothyroidism   . Lichen sclerosus [id number]   Biopsy proven  . Menopause   . Mild aortic insufficiency   .  Osteopenia   . Seasonal allergies     Past Surgical History:  Procedure Laterality Date  . ACHILLES TENDON SURGERY Right 04/25/2016   Procedure: ACHILLES TENDON REPAIR,PARTIAL EXCISION CALCANEOUS;  Surgeon: [name], MD;  Location: [facility];  Service: Orthopedics;  Laterality: Right;  . APPENDECTOMY  1977  . BACK SURGERY    .  BLEPHAROPLASTY Bilateral   . CARDIAC CATHETERIZATION  ~ 2014  . CARPAL TUNNEL RELEASE Right   . CATARACT EXTRACTION W/ INTRAOCULAR LENS  IMPLANT, BILATERAL Bilateral   . CERVICAL FUSION  1999   C5-7  . DILATATION & CURETTAGE/HYSTEROSCOPY WITH TRUECLEAR N/A 05/02/2013   Procedure: DILATATION & CURETTAGE/HYSTEROSCOPY WITH TRUECLEAR ;  Surgeon: Anastasio Auerbach, MD;  Location: [facility];  Service: Gynecology;  Laterality: N/A;  . DILATION AND CURETTAGE OF UTERUS    . HARDWARE REMOVAL Left 12/12/2013   Procedure:  LEFT PATELLA HARDWARE REMOVAL;  Surgeon: [name], MD;  Location: [facility];  Service: Orthopedics;  Laterality: Left;  . HYSTEROSCOPY    . JOINT REPLACEMENT    . KNEE ARTHROSCOPY Left 12/12/2013   Procedure: LEFT PATELLA ARTHROSCOPY KNEE WITH DEBRIDEMENT/SHAVING (CONDROPLASTY), LYSIS OF ADHESIONS;  Surgeon: [name], MD;  Location: [facility];  Service: Orthopedics;  Laterality: Left;  . LEFT HEART CATHETERIZATION WITH CORONARY ANGIOGRAM N/A 04/22/2014   Procedure: LEFT HEART CATHETERIZATION WITH CORONARY ANGIOGRAM;  Surgeon: Sinclair Grooms, MD;  Location: [facility] CATH LAB;  Service: Cardiovascular;  Laterality: N/A;  . LUMBAR [place] SURGERY  2004   L5  . PARTIAL KNEE ARTHROPLASTY Left 01/13/2016   Procedure: LEFT UNICOMPARTMENTAL KNEE;  Surgeon: [name], MD;  Location: [facility];  Service: Orthopedics;  Laterality: Left;  . PATELLA RECONSTRUCTION Left 1994  . REPLACEMENT UNICONDYLAR JOINT KNEE Left 01/13/2016  . RIGHT OOPHORECTOMY  1978  . TONSILLECTOMY      Current Medications: Current Meds  Medication Sig  . Alirocumab (PRALUENT) 75 MG/ML SOAJ Inject 75 mg into the skin every 14 (fourteen) days.  . B Complex Vitamins (VITAMIN B-COMPLEX PO) Take by mouth daily.  . Biotin w/ Vitamins C & E (HAIR/SKIN/NAILS PO) Take 1 tablet by mouth daily.  Marland Kitchen CALCIUM PO Take by mouth daily.  . Cholecalciferol (VITAMIN D3) 5000 UNITS TABS Take 1 tablet by  mouth every Monday, Wednesday, and Friday.   . DULoxetine (CYMBALTA) 20 MG capsule Take 20 mg by mouth daily.  . fluticasone (FLONASE) 50 MCG/ACT nasal spray as needed.  . hyoscyamine (LEVSIN/SL) 0.125 MG SL tablet Place 1 tablet (0.125 mg total) under the tongue every 4 (four) hours as needed.  Lowanda Foster (XIIDRA OP) Apply to eye 2 (two) times daily. For dry eyes  . Probiotic Product (PROBIOTIC PO) Take 1-2 capsules by mouth as needed (for GI support).   . SYNTHROID 25 MCG tablet Take 25 mcg by mouth daily before breakfast.   . thyroid (ARMOUR) 90 MG tablet Take 60 mg by mouth daily.   . TURMERIC PO Take 1 capsule by mouth daily.   Current Facility-Administered Medications for the 08/13/19 encounter (Office Visit) with Charlie Pitter, PA-C  Medication  . 0.9 %  sodium chloride infusion      Allergies:   Aleve [naproxen sodium], Aspirin, Atorvastatin, Clarithromycin, Crestor  [rosuvastatin calcium], Durezol [difluprednate], Olopatadine hcl, Ondansetron, Penicillins, Pitavastatin, Statins, Crestor [rosuvastatin], Other, Prednisone, Adhesive [tape], and Zofran [ondansetron hcl]   Social History   Socioeconomic History  . Marital status:  Divorced    Spouse name: Not on file  . Number of children: 2  . Years of education: Not on file  . Highest education level: Not on file  Occupational History  . Occupation: RESEARCH ASSIST    Employer: [employer]    Comment: Retired   Tobacco Use  . Smoking status: Former Smoker    Packs/day: 1.00    Years: 27.00    Pack years: 27.00    Types: Cigarettes    Quit date: 12/10/1983    Years since quitting: 35.6  . Smokeless tobacco: Never Used  Substance and Sexual Activity  . Alcohol use: Yes    Comment: 01/13/2016 "I'll have a drink < 2 times/month"  . Drug use: No  . Sexual activity: Never  Other Topics Concern  . Not on file  Social History Narrative   Daily caffeine    Social Determinants of Health   Financial Resource  Strain:   . Difficulty of Paying Living Expenses: Not on file  Food Insecurity:   . Worried About Charity fundraiser in the Last Year: Not on file  . Ran Out of Food in the Last Year: Not on file  Transportation Needs:   . Lack of Transportation (Medical): Not on file  . Lack of Transportation (Non-Medical): Not on file  Physical Activity:   . Days of Exercise per Week: Not on file  . Minutes of Exercise per Session: Not on file  Stress:   . Feeling of Stress : Not on file  Social Connections:   . Frequency of Communication with Friends and Family: Not on file  . Frequency of Social Gatherings with Friends and Family: Not on file  . Attends Religious Services: Not on file  . Active Member of Clubs or Organizations: Not on file  . Attends Archivist Meetings: Not on file  . Marital Status: Not on file     Family History:  The patient's family history includes Allergies in her mother; Heart disease in her father. There is no history of Colon cancer.  ROS:   Please see the history of present illness. No significant daytime fatigue/dozing. All other systems are reviewed and otherwise negative.    EKGs/Labs/Other Studies Reviewed:    Studies reviewed were summarized above.   EKG:  EKG is not ordered today  Recent Labs: 01/29/2019: ALT 25 06/13/2019: BUN 13; Creatinine, Ser 0.62; Hemoglobin 15.7; Platelets 322; Potassium 4.2; Sodium 144; TSH 0.218  Recent Lipid Panel    Component Value Date/Time   CHOL 143 01/29/2019 0859   TRIG 139 01/29/2019 0859   HDL 53 01/29/2019 0859   CHOLHDL 2.7 01/29/2019 0859   CHOLHDL 4 07/15/2014 0835   VLDL 25.2 07/15/2014 0835   LDLCALC 62 01/29/2019 0859   LDLDIRECT 206.1 08/01/2007 0917    PHYSICAL EXAM:    VS:  BP 140/82   Pulse 76   Ht 5' (1.524 m)   Wt 157 lb 1.9 oz (71.3 kg)   SpO2 97%   BMI 30.69 kg/m   BMI: Body mass index is 30.69 kg/m.  GEN: Well nourished, well developed WF in no acute distress HEENT:  normocephalic, atraumatic Neck: no JVD, carotid bruits, or masses Cardiac: RRR; no murmurs, rubs, or gallops, no edema  Respiratory:  clear to auscultation bilaterally, normal work of breathing GI: soft, nontender, nondistended, + BS MS: no deformity or atrophy Skin: warm and dry, no rash Neuro:  Alert and Oriented x 3, Strength and  sensation are intact, follows commands Psych: euthymic mood, full affect  Wt Readings from Last 3 Encounters:  08/13/19 157 lb 1.9 oz (71.3 kg)  06/13/19 158 lb 6.4 oz (71.8 kg)  06/14/18 152 lb (68.9 kg)     ASSESSMENT & PLAN:   1. Essential HTN - confirmed by several blood pressure readings since last visit. Her father had blood pressure issues so she wants to be proactive in addressing any risk factors. She would prefer to try a diuretic. We will start chlorthalidone 12.5mg  daily with recheck BMET in 1 week. Her K traditionally has run 4.2-4.4 so I will not yet add any potassium. We will check in with her by phone at the time of lab results and also arrange a 2 week f/u telehealth visit to discuss her readings. 2. Falling - no recurrence. More mechanical in nature. If this happens again, I would suggest discussing PT evaluation with primary care. 3. Mild aortic insufficiency - stable by recent echocardiogram. Repeat echo would be due in 3-5 years, can arrange closer to that time. No murmur on examination today. 4. Hypothyroidism - Armour thyroid was decreased in October after TSH came back suppressed. She believes her PCP plans to recheck in a few months. Since she is getting labs in 1 week in our office anyway, I told her to reach out to primary care to find out if they felt the timing was OK to just repeat thyroid function while she is here to save her a trip/exposure during this pandemic. I am happy to share those results with primary care for further advisement if they wish. I did advise for her to be off Biotin for 5 days prior to having thyroid function  checked to limit test interference. 5. Hyperlipidemia - well controlled by LDL in 01/2019. Continue Praluent. She inquired whether this could be causing elevated blood pressure and I told her I did not think so based on known adverse effects. I will route this note to our pharmacy team to find out if they specifically need to establish any followup appointments with her, or if we just need to get lipids/LFTs in 01/2020. Addendum: per pharmacy team, patient will need labs before prior auth renewal in January, so will plan to get CMET + lipid profile when she comes for labs instead of BMET. See phone note - MA to call patient to let her know.  Disposition: The patient expressed a desire to have a concrete follow-up plan for additional steps if blood pressure remains suboptimally controlled. Will arrange f/u with APP in 2 weeks for telehealth visit to discuss blood pressures. She has a home blood pressure monitor. I looked to see if the members that know her had availability (myself, [place] or Dr. Meda Coffee) but we do not have clinic days during the timeframe needed. She will be seeing Roby Lofts PA-C whom I know will do a great job following up. If blood pressure is controlled at that time, I anticipate she can get back on track for yearly follow-up.  Medication Adjustments/Labs and Tests Ordered: Current medicines are reviewed at length with the patient today.  Concerns regarding medicines are outlined above. Medication changes, Labs and Tests ordered today are summarized above and listed in the Patient Instructions accessible in Encounters.   Signed, Charlie Pitter, PA-C  08/13/2019 12:19 PM    [facility] [address], [place], [place]  [address] Phone: [phone]; Fax: [phone]

## 2019-08-13 ENCOUNTER — Other Ambulatory Visit: Payer: Self-pay

## 2019-08-13 ENCOUNTER — Ambulatory Visit (INDEPENDENT_AMBULATORY_CARE_PROVIDER_SITE_OTHER): Payer: Medicare Other | Admitting: Physician Assistant

## 2019-08-13 ENCOUNTER — Telehealth: Payer: Self-pay | Admitting: Physician Assistant

## 2019-08-13 ENCOUNTER — Encounter: Payer: Self-pay | Admitting: Physician Assistant

## 2019-08-13 VITALS — BP 140/82 | HR 76 | Ht 60.0 in | Wt 157.1 lb

## 2019-08-13 DIAGNOSIS — I10 Essential (primary) hypertension: Secondary | ICD-10-CM

## 2019-08-13 DIAGNOSIS — W19XXXD Unspecified fall, subsequent encounter: Secondary | ICD-10-CM | POA: Diagnosis not present

## 2019-08-13 DIAGNOSIS — R030 Elevated blood-pressure reading, without diagnosis of hypertension: Secondary | ICD-10-CM

## 2019-08-13 DIAGNOSIS — E039 Hypothyroidism, unspecified: Secondary | ICD-10-CM

## 2019-08-13 DIAGNOSIS — I351 Nonrheumatic aortic (valve) insufficiency: Secondary | ICD-10-CM

## 2019-08-13 DIAGNOSIS — E785 Hyperlipidemia, unspecified: Secondary | ICD-10-CM

## 2019-08-13 MED ORDER — CHLORTHALIDONE 25 MG PO TABS: 12.5000 mg | ORAL_TABLET | Freq: Every day | ORAL | 3 refills | Status: DC

## 2019-08-13 NOTE — Patient Instructions (Addendum)
Medication Instructions:  Your physician has recommended you make the following change in your medication: 1.  START Chlorthalidone 25 mg taking 1/2 tablet daily  *If you need a refill on your cardiac medications before your next appointment, please call your pharmacy*  Lab Work: 08/21/2019:  COME TO THE OFFICE FOR LAB WORK  If you have labs (blood work) drawn today and your tests are completely normal, you will receive your results only by: Marland Kitchen MyChart Message (if you have MyChart) OR . A paper copy in the mail If you have any lab test that is abnormal or we need to change your treatment, we will call you to review the results.  Testing/Procedures: None ordered  Follow-Up: At [facility], you and your health needs are our priority.  As part of our continuing mission to provide you with exceptional heart care, we have created designated Provider Care Teams.  These Care Teams include your primary Cardiologist (physician) and Advanced Practice Providers (APPs -  Physician Assistants and Nurse Practitioners) who all work together to provide you with the care you need, when you need it.  Your next appointment:   08/28/2019 1:30   The format for your next appointment:   Virtual Visit   Provider:   Roby Lofts, PA-C  Other Instructions Please call your primary care provider and see if they would like [place] to get your thyroid function checked when you return for your BMET in 1 week. We are happy to add it in on to save you a trip, and can share those results with them. Please remember you should be off biotin supplements approximately 5 days prior to having your thyroid checked.

## 2019-08-13 NOTE — Addendum Note (Signed)
Addended by: Gaetano Net on: 08/13/2019 01:12 PM   Modules accepted: Orders

## 2019-08-13 NOTE — Telephone Encounter (Signed)
Pt has been made aware that we will need updated lipid/lft for renewal of Repatha.  Have changed the BMET to a CMET for 08/21/2019 and added LFT. Pt has been made aware to come fasting.  Pt very appreciative of the call.

## 2019-08-13 NOTE — Telephone Encounter (Signed)
   Please let pt know I reviewed her chart with pharmacy team to see if there is anything else we need to do with her cholesterol labs. Per Marcelle Overlie, "Her prior auth for Repatha is going to expire 09/23/2018. We probably will need to get more recent labs to renew. If you could get a lipid panel when you get a BMET that would be great! Thanks."  Anderson [place], can we change the BMET to a CMET + lipid profile? Will need to come fasting. Thank you! Dayna Dunn PA-C

## 2019-08-15 ENCOUNTER — Encounter: Payer: Self-pay | Admitting: Physician Assistant

## 2019-08-15 NOTE — Telephone Encounter (Signed)
   Patient calling to ensure additional thyroid labs be ordered

## 2019-08-15 NOTE — Addendum Note (Signed)
Addended by: Gaetano Net on: 08/15/2019 10:35 AM   Modules accepted: Orders

## 2019-08-15 NOTE — Telephone Encounter (Signed)
No note needed 

## 2019-08-21 ENCOUNTER — Other Ambulatory Visit: Payer: Self-pay

## 2019-08-21 ENCOUNTER — Other Ambulatory Visit: Payer: Medicare Other | Admitting: *Deleted

## 2019-08-21 DIAGNOSIS — E785 Hyperlipidemia, unspecified: Secondary | ICD-10-CM

## 2019-08-21 DIAGNOSIS — W19XXXD Unspecified fall, subsequent encounter: Secondary | ICD-10-CM

## 2019-08-21 DIAGNOSIS — I10 Essential (primary) hypertension: Secondary | ICD-10-CM

## 2019-08-21 DIAGNOSIS — E039 Hypothyroidism, unspecified: Secondary | ICD-10-CM

## 2019-08-21 DIAGNOSIS — I351 Nonrheumatic aortic (valve) insufficiency: Secondary | ICD-10-CM

## 2019-08-22 ENCOUNTER — Telehealth: Payer: Self-pay | Admitting: Cardiology

## 2019-08-22 LAB — COMPREHENSIVE METABOLIC PANEL
ALT: 26 IU/L (ref 0–32)
AST: 24 IU/L (ref 0–40)
Albumin/Globulin Ratio: 1.8 (ref 1.2–2.2)
Albumin: 4.4 g/dL (ref 3.7–4.7)
Alkaline Phosphatase: 100 IU/L (ref 39–117)
BUN/Creatinine Ratio: 18 (ref 12–28)
BUN: 12 mg/dL (ref 8–27)
Bilirubin Total: 0.9 mg/dL (ref 0.0–1.2)
CO2: 27 mmol/L (ref 20–29)
Calcium: 9.9 mg/dL (ref 8.7–10.3)
Chloride: 98 mmol/L (ref 96–106)
Creatinine, Ser: 0.68 mg/dL (ref 0.57–1.00)
GFR calc Af Amer: 96 mL/min/{1.73_m2} (ref >59)
GFR calc non Af Amer: 83 mL/min/{1.73_m2} (ref >59)
Globulin, Total: 2.4 g/dL (ref 1.5–4.5)
Glucose: 120 mg/dL — ABNORMAL HIGH (ref 65–99)
Potassium: 3.7 mmol/L (ref 3.5–5.2)
Sodium: 141 mmol/L (ref 134–144)
Total Protein: 6.8 g/dL (ref 6.0–8.5)

## 2019-08-22 LAB — TSH: TSH: 0.517 u[IU]/mL (ref 0.450–4.500)

## 2019-08-22 LAB — LIPID PANEL
Chol/HDL Ratio: 2.7 ratio (ref 0.0–4.4)
Cholesterol, Total: 145 mg/dL (ref 100–199)
HDL: 54 mg/dL (ref >39)
LDL Chol Calc (NIH): 62 mg/dL (ref 0–99)
Triglycerides: 174 mg/dL — ABNORMAL HIGH (ref 0–149)
VLDL Cholesterol Cal: 29 mg/dL (ref 5–40)

## 2019-08-22 NOTE — Telephone Encounter (Signed)
Patient is calling to speak with someone regarding her lab results.

## 2019-08-22 NOTE — Telephone Encounter (Signed)
Pt inquiring about lab results, states she is having trouble accessing on mychart.   Following message reviewed:  Charlie Pitter, PA-C  Please let pt know labs all look good, only a few abnormalities. Blood sugar is mildly elevated - had been normal this summer. Will need to f/u PCP to monitor for any pre-diabetes. Kidney function and thyroid are normal. Her cholesterol shows a slight uptick in triglyceride level but LDL (bad cholesterol) is normal. Would recommend attention to this with healthy eating and exercise as she is able. Overall labs look good. How is blood pressure running on the new med?  Pt states she has not been checking her BP, recommended she start toi ensure that Chlorthalidone is working.  Pt indicates she does have a l;ot of sweets in her diet and will consult with PCP regarding elevated glucose and triglycerides.  Pt reports she tries to walk 20 minutes daily.

## 2019-08-27 NOTE — Progress Notes (Signed)
Virtual Visit via Telephone Note   This visit type was conducted due to national recommendations for restrictions regarding the COVID-19 Pandemic (e.g. social distancing) in an effort to limit this patient's exposure and mitigate transmission in our community.  Due to her co-morbid illnesses, this patient is at least at moderate risk for complications without adequate follow up.  This format is felt to be most appropriate for this patient at this time.  The patient did not have access to video technology/had technical difficulties with video requiring transitioning to audio format only (telephone).  All issues noted in this document were discussed and addressed.  No physical exam could be performed with this format.  Please refer to the patient's chart for her  consent to telehealth for [facility].   Date:  08/28/2019   ID:  Marilyn Rivas, DOB 1940/09/21, MRN [id number]  Patient Location: Home Provider Location: Office  PCP:  Willey Blade, MD  Cardiologist:  Ena Dawley, MD  Electrophysiologist:  None   Evaluation Performed:  Follow-Up Visit  Chief Complaint:  Follow-up blood pressure  History of Present Illness:    LOXIE WELTE is a 78 y.o. female with a PMH of HTN, aortic insufficiency, HLD, lichen sclerosis, GERD, hypothyroidism, anxiety, depression, arthritis, and former tobacco abuse, who presents for follow-up of her recently diagnosed HTN.  She was initially followed by cardiology for management of her HLD given statin intolerance and family history of CAD. She was subsequently started on praluent with improvement in LDL. She underwent a LHC in 2015 to evaluate an abnormal stress test which revealed normal coronary arteries. Echo 05/2019 showed EF 60-65%, mild LVH, indeterminate LV diastolic filling, and mild AI.   She was last evaluated by cardiology at an outpatient visit with Melina Copa, PA-C 08/13/2019, at which time the patient reported elevated  blood pressure readings with SBP in the 130s-150s. She had no other cardiac complaints at that visit. She was formally diagnosed with HTN and started on chlorthalidone 12.5mg  daily for management. BMET and [place] 08/21/2019 showed stable Cr and electrolyte levels, mildly elevated triglyceride levels, and well controlled LDL.   She presents today via a phone visit for close follow-up of her HTN. She has not been keeping an eye on her blood pressure over the past couple weeks. She just got back from a 1 mile walk which she thinks is why her blood pressure was a little elevated. Repeat blood pressure after resting prior to my call was 136/82. She has been working to limit her salt intake and has been exercising more regularly, walking 1-2 miles a day. She questions whether her elevated blood pressures could be 2/2 recently prescribed Cymbalta, which does have elevated blood pressures listed as an adverse reaction. She notes significant improvement in her She reports some mild dizziness when bending over but no lightheadedness or syncope. No complaints of chest pain, SOB, palpitations, or LE edema.    The patient does not have symptoms concerning for COVID-19 infection (fever, chills, cough, or new shortness of breath).    Past Medical History:  Diagnosis Date  . Anxiety   . Arthritis    "left; maybe back" (01/13/2016)  . Cancer ([place])    skin pre-cancer  . Depression   . Diverticulitis   . Diverticulosis   . Family history of adverse reaction to anesthesia    "daughter gets really nauseous & has trouble getting intubated" (01/13/2016)  . GERD (gastroesophageal reflux disease)    HISTORY  .  History of blood transfusion 1977   "related to OR"  . Hx of adenomatous colonic polyps   . Hyperlipidemia   . Hypothyroidism   . Lichen sclerosus [id number]   Biopsy proven  . Menopause   . Mild aortic insufficiency   . Osteopenia   . Seasonal allergies    Past Surgical History:  Procedure Laterality Date  .  ACHILLES TENDON SURGERY Right 04/25/2016   Procedure: ACHILLES TENDON REPAIR,PARTIAL EXCISION CALCANEOUS;  Surgeon: [name], MD;  Location: [place];  Service: Orthopedics;  Laterality: Right;  . APPENDECTOMY  1977  . BACK SURGERY    . BLEPHAROPLASTY Bilateral   . CARDIAC CATHETERIZATION  ~ 2014  . CARPAL TUNNEL RELEASE Right   . CATARACT EXTRACTION W/ INTRAOCULAR LENS  IMPLANT, BILATERAL Bilateral   . CERVICAL FUSION  1999   C5-7  . DILATATION & CURETTAGE/HYSTEROSCOPY WITH TRUECLEAR N/A 05/02/2013   Procedure: DILATATION & CURETTAGE/HYSTEROSCOPY WITH TRUECLEAR ;  Surgeon: Anastasio Auerbach, MD;  Location: [facility];  Service: Gynecology;  Laterality: N/A;  . DILATION AND CURETTAGE OF UTERUS    . HARDWARE REMOVAL Left 12/12/2013   Procedure:  LEFT PATELLA HARDWARE REMOVAL;  Surgeon: [name], MD;  Location: [place];  Service: Orthopedics;  Laterality: Left;  . HYSTEROSCOPY    . JOINT REPLACEMENT    . KNEE ARTHROSCOPY Left 12/12/2013   Procedure: LEFT PATELLA ARTHROSCOPY KNEE WITH DEBRIDEMENT/SHAVING (CONDROPLASTY), LYSIS OF ADHESIONS;  Surgeon: [name], MD;  Location: [place];  Service: Orthopedics;  Laterality: Left;  . LEFT HEART CATHETERIZATION WITH CORONARY ANGIOGRAM N/A 04/22/2014   Procedure: LEFT HEART CATHETERIZATION WITH CORONARY ANGIOGRAM;  Surgeon: Sinclair Grooms, MD;  Location: [facility] C[facility];  Service: Cardiovascular;  Laterality: N/A;  . LUMBAR [place] SURGERY  2004   L5  . PARTIAL KNEE ARTHROPLASTY Left 01/13/2016   Procedure: LEFT UNICOMPARTMENTAL KNEE;  Surgeon: [name], MD;  Location: [place];  Service: Orthopedics;  Laterality: Left;  . PATELLA RECONSTRUCTION Left 1994  . REPLACEMENT UNICONDYLAR JOINT KNEE Left 01/13/2016  . RIGHT OOPHORECTOMY  1978  . TONSILLECTOMY       Current Meds  Medication Sig  . Alirocumab (PRALUENT) 75 MG/ML SOAJ Inject 75 mg into the skin every 14 (fourteen) days.  . B  Complex Vitamins (VITAMIN B-COMPLEX PO) Take by mouth daily.  . Biotin w/ Vitamins C & E (HAIR/SKIN/NAILS PO) Take 1 tablet by mouth daily.  Marland Kitchen CALCIUM PO Take by mouth daily.  . chlorthalidone (HYGROTON) 25 MG tablet Take 0.5 tablets (12.5 mg total) by mouth daily.  . Cholecalciferol (VITAMIN D3) 5000 UNITS TABS Take 1 tablet by mouth every Monday, Wednesday, and Friday.   . DULoxetine (CYMBALTA) 20 MG capsule Take 20 mg by mouth daily.  . fluticasone (FLONASE) 50 MCG/ACT nasal spray as needed.  . hyoscyamine (LEVSIN/SL) 0.125 MG SL tablet Place 1 tablet (0.125 mg total) under the tongue every 4 (four) hours as needed.  Lowanda Foster (XIIDRA OP) Apply to eye 2 (two) times daily. For dry eyes  . Probiotic Product (PROBIOTIC PO) Take 1-2 capsules by mouth as needed (for GI support).   . SYNTHROID 25 MCG tablet Take 25 mcg by mouth daily before breakfast.   . thyroid (ARMOUR) 90 MG tablet Take 60 mg by mouth daily.   . TURMERIC PO Take 1 capsule by mouth daily.   Current Facility-Administered Medications for the 08/28/19 encounter (Telemedicine) with Roby Lofts  M., PA-C  Medication  . 0.9 %  sodium chloride infusion     Allergies:   Aleve [naproxen sodium], Aspirin, Atorvastatin, Clarithromycin, Crestor  [rosuvastatin calcium], Durezol [difluprednate], Olopatadine hcl, Ondansetron, Penicillins, Pitavastatin, Statins, Crestor [rosuvastatin], Other, Prednisone, Adhesive [tape], and Zofran [ondansetron hcl]   Social History   Tobacco Use  . Smoking status: Former Smoker    Packs/day: 1.00    Years: 27.00    Pack years: 27.00    Types: Cigarettes    Quit date: 12/10/1983    Years since quitting: 35.7  . Smokeless tobacco: Never Used  Substance Use Topics  . Alcohol use: Yes    Comment: 01/13/2016 "I'll have a drink < 2 times/month"  . Drug use: No     Family Hx: The patient's family history includes Allergies in her mother; Heart disease in her father. There is no history of Colon  cancer.  ROS:   Please see the history of present illness.     All other systems reviewed and are negative.   Prior CV studies:   The following studies were reviewed today:  Echocardiogram 05/2019: 1. Left ventricular ejection fraction, by visual estimation, is 60 to 65%. The left ventricle has normal function. Normal left ventricular size. There is mildly increased left ventricular hypertrophy.  2. Left ventricular diastolic Doppler parameters are indeterminate pattern of LV diastolic filling.  3. Global right ventricle has normal systolic function.The right ventricular size is normal. No increase in right ventricular wall thickness.  4. Left atrial size was normal.  5. Right atrial size was normal.  6. The mitral valve is normal in structure. No evidence of mitral valve regurgitation.  7. The tricuspid valve is normal in structure. Tricuspid valve regurgitation was not visualized by color flow Doppler.  8. The aortic valve is tricuspid Aortic valve regurgitation is mild by color flow Doppler.  9. The pulmonic valve was not well visualized. Pulmonic valve regurgitation is not visualized by color flow Doppler. 10. The inferior vena cava is normal in size with greater than 50% respiratory variability, suggesting right atrial pressure of 3 mmHg.  Labs/Other Tests and Data Reviewed:    EKG:  An ECG dated 06/13/2019 was personally reviewed today and demonstrated:  NSR, rate 73, LVH, ST-T wave abnormalities in inferior and anterolateral leads; overall unchanged from previous  Recent Labs: 06/13/2019: Hemoglobin 15.7; Platelets 322 08/21/2019: ALT 26; BUN 12; Creatinine, Ser 0.68; Potassium 3.7; Sodium 141; TSH 0.517   Recent Lipid Panel Lab Results  Component Value Date/Time   CHOL 145 08/21/2019 08:33 AM   TRIG 174 (H) 08/21/2019 08:33 AM   HDL 54 08/21/2019 08:33 AM   CHOLHDL 2.7 08/21/2019 08:33 AM   CHOLHDL 4 07/15/2014 08:35 AM   LDLCALC 62 08/21/2019 08:33 AM   LDLDIRECT  206.1 08/01/2007 09:17 AM    Wt Readings from Last 3 Encounters:  08/28/19 153 lb (69.4 kg)  08/13/19 157 lb 1.9 oz (71.3 kg)  06/13/19 158 lb 6.4 oz (71.8 kg)     Objective:    Vital Signs:  BP (!) 157/92   Pulse 80   Wt 153 lb (69.4 kg)   BMI 29.88 kg/m    VITAL SIGNS:  reviewed GEN:  no acute distress RESPIRATORY:  speaking in full sentences without SOB PSYCH:  normal affect  ASSESSMENT & PLAN:    1. HTN: She has not been monitoring her BP at home. BP 136/82 today after resting post-walk.  - Recommend close monitoring over the  next week - patent instructed to keep a log (AM/PM) and to send a MyChart message with these readings to determine if adjustments need to be made.  - Continue chlorthalidone 12.5mg  daily  2. HLD: LDL well controlled at 62 on FLP 08/21/2019; triglycerides mildly elevated to 174. Recommend limiting fatty food intake - Continue praluent  3. Aortic insufficiency: mild on echo 05/2019 - Anticipate surveillance echo in 3 years or sooner if new symptoms develop.   COVID-19 Education: The signs and symptoms of COVID-19 were discussed with the patient and how to seek care for testing (follow up with PCP or arrange E-visit).  The importance of social distancing was discussed today.  Time:   Today, I have spent 10 minutes with the patient with telehealth technology discussing the above problems.     Medication Adjustments/Labs and Tests Ordered: Current medicines are reviewed at length with the patient today.  Concerns regarding medicines are outlined above.   Tests Ordered: No orders of the defined types were placed in this encounter.   Medication Changes: No orders of the defined types were placed in this encounter.   Follow Up:  Either In Person or Virtual with Melina Copa, PA-C in 2 months  Signed, Abigail Butts, PA-C  08/28/2019 1:52 PM    [place]

## 2019-08-28 ENCOUNTER — Encounter: Payer: Self-pay | Admitting: Medical

## 2019-08-28 ENCOUNTER — Telehealth (INDEPENDENT_AMBULATORY_CARE_PROVIDER_SITE_OTHER): Payer: Medicare Other | Admitting: Medical

## 2019-08-28 VITALS — BP 157/92 | HR 80 | Wt 153.0 lb

## 2019-08-28 DIAGNOSIS — E785 Hyperlipidemia, unspecified: Secondary | ICD-10-CM

## 2019-08-28 DIAGNOSIS — I10 Essential (primary) hypertension: Secondary | ICD-10-CM | POA: Diagnosis not present

## 2019-08-28 DIAGNOSIS — I351 Nonrheumatic aortic (valve) insufficiency: Secondary | ICD-10-CM | POA: Diagnosis not present

## 2019-08-28 NOTE — Patient Instructions (Signed)
Medication Instructions:  No changes  *If you need a refill on your cardiac medications before your next appointment, please call your pharmacy*  Lab Work: None order  If you have labs (blood work) drawn today and your tests are completely normal, you will receive your results only by: Marland Kitchen MyChart Message (if you have MyChart) OR . A paper copy in the mail If you have any lab test that is abnormal or we need to change your treatment, we will call you to review the results.  Testing/Procedures:  none  Follow-Up: At [facility], you and your health needs are our priority.  As part of our continuing mission to provide you with exceptional heart care, we have created designated Provider Care Teams.  These Care Teams include your primary Cardiologist (physician) and Advanced Practice Providers (APPs -  Physician Assistants and Nurse Practitioners) who all work together to provide you with the care you need, when you need it.  Your next appointment:   2 month(s)  The format for your next appointment:   Either In Person or Virtual  Provider:   Melina Copa, PA  Other Instructions:  Please continue to check your blood pressure every day and write them down in a log for your doctors review Continue to have a low sodium diet and follow the SALTY SIX dietary recommendation.  Continue to exercise for at least 30 minutes every day.

## 2019-09-16 ENCOUNTER — Telehealth: Payer: Self-pay | Admitting: Physician Assistant

## 2019-09-16 NOTE — Telephone Encounter (Signed)
New Message:     Pt wants to know if she can increase her Chlorthalidone to 50 mg? She does not think it is doing the job it needs to be doing.

## 2019-09-17 ENCOUNTER — Ambulatory Visit: Payer: Medicare Other | Attending: Internal Medicine

## 2019-09-17 DIAGNOSIS — Z23 Encounter for immunization: Secondary | ICD-10-CM | POA: Insufficient documentation

## 2019-09-17 NOTE — Progress Notes (Signed)
   Covid-19 Vaccination Clinic  Name:  Marilyn Rivas    MRN: [id number] DOB: May 21, 1941  09/17/2019  Ms. Norberto was observed post Covid-19 immunization for 30 minutes based on pre-vaccination screening without incidence. She was provided with Vaccine Information Sheet and instruction to access the V-Safe system.   Ms. Melle was instructed to call 911 with any severe reactions post vaccine: Marland Kitchen Difficulty breathing  . Swelling of your face and throat  . A fast heartbeat  . A bad rash all over your body  . Dizziness and weakness    Immunizations Administered    Name Date Dose VIS Date Route   Pfizer COVID-19 Vaccine 09/17/2019  1:28 PM 0.3 mL 08/09/2019 Intramuscular   Manufacturer: [employer], [employer]   Lot: [id number]   [place]: [id number]

## 2019-09-18 NOTE — Telephone Encounter (Signed)
Called and spoke with Mrs. Marilyn Rivas and informed her of the recommendation from Roby Lofts, PA-C to increase her Chlorthalidone to 25 mg daily and to continue to monitor her blood pressure. Patient verbalized an understanding and all questions were answered.

## 2019-09-18 NOTE — Telephone Encounter (Signed)
Call placed to pt re: phone note asking to increase her Chlorthalidone to 50 mg.  When spoke with pt, she told me that she mis spoke on her message, that she is only taking 1/2 of 25 mg tablet and wants to know if she can increase it to a whole tablet, 25 mg.  States her bp is ok on bottom and her pulse is fine, but her systolic is running mainly at 145.  Advised pt I would send the message over to [facility], PA-C, and let her advise.

## 2019-10-04 NOTE — Progress Notes (Signed)
Virtual Visit via Telephone Note   This visit type was conducted due to national recommendations for restrictions regarding the COVID-19 Pandemic (e.g. social distancing) in an effort to limit this patient's exposure and mitigate transmission in our community.  Due to her co-morbid illnesses, this patient is at least at moderate risk for complications without adequate follow up.  This format is felt to be most appropriate for this patient at this time.  The patient did not have access to video technology/had technical difficulties with video requiring transitioning to audio format only (telephone).  All issues noted in this document were discussed and addressed.  No physical exam could be performed with this format. The patient consented to telehealth visit with [facility]. Virtual platform was offered given ongoing worsening Covid-19 pandemic.  Date:  10/07/2019   ID:  Marilyn Rivas, DOB [birth year], MRN [id number]  Patient Location: Home Provider Location: [facility]  PCP:  Willey Blade, MD  Cardiologist:  Ena Dawley, MD  Electrophysiologist:  None   Evaluation Performed:  Follow-Up Visit  Chief Complaint: f/u BP  History of Present Illness:    Marilyn Rivas is a 79 y.o. female with mild aortic insufficiency, anxiety, arthritis, depression, diverticulosis, GERD, hypothyroidism, hyperlipidemia, lichen sclerosis, and former tobacco abuse who presents for follow-up of blood pressure.  She was remotely followed by Dr. Verl Blalock but more recently by Dr. Meda Coffee. She was initially referred by OBGYN for management of her lipids. Her father had myocardial infarction around age of 17 and underwent bypass surgery. He also AAA repair at age of 54. Her daughter has a pacemaker. The patient believes this was related to issues with narcotic use. Ms. Carey underwent a cardiac catheterization in 2015 for abnormal stress test that showed normal coronary arteries. Since  that time she's been followed for risk stratification. She has followed with our lipid clinic for h/o statin intolerances (atorvastatin 10mg  daily, rosuvastatin 5mg  twice weekly, Livalo 1mg  daily causing muscle/joint pain, zetia 10mg  daily being ineffective, fenofibrate 54mg  daily being ineffective). She has a history of disproportionate LDL-P and LDL-C ratio conferring increased risk of arthrosclerosis. She has since been placed on Praluent and done well.    She saw Sande Rives PA-C 06/13/19 for routine follow-up at which time she reported 2 episodes of falling. One was clearly a mechanical fall where she stood up from the table and her feet got caught up in the chair/table and she fell on her shoulder. The second time occurred when she was walking outside on a hot day with her grandson. They had been walking for about 3 miles (which she felt was too far for her) when she started to feel tired and felt like she was starting to lean forward. She reached for a mailbox and just slipped and fell. She denied any palpitations, lightheadedness, dizziness, or syncope. 2D echo 06/18/19 showed EF 60-65%, mildly increased LVH, indeterminate LV diastolic filling, mild AI. Her blood pressure was mildly elevated at that visit. The patient did not wish to start any new medications, but was asked to monitor and follow-up. When seen in followup December 2020, her blood pressure was still elevated so we started chlorthalidone. Last labs 07/2019 showed normal TSH, triglyicerides 174, LDL 62, K 3.7, Cr 0.68, glucose 120, normal LFTs.   She is seen virtually for follow-up today and reports she is overall feeling well without acute complaint. She had her BP cuff checked at PCP's office for accuracy. Since increasing the chlorthalidone her blood  pressure has done great, relaying sample readings of 116/72, 120/62, and 127/62. No recurrent fall issues. She got dose one of Covid vaccine recently.   Past Medical History:    Diagnosis Date  . Anxiety   . Arthritis    "left; maybe back" (01/13/2016)  . Cancer ([place])    skin pre-cancer  . Depression   . Diverticulitis   . Diverticulosis   . Family history of adverse reaction to anesthesia    "daughter gets really nauseous & has trouble getting intubated" (01/13/2016)  . GERD (gastroesophageal reflux disease)    HISTORY  . History of blood transfusion 1977   "related to OR"  . Hx of adenomatous colonic polyps   . Hyperlipidemia   . Hypothyroidism   . Lichen sclerosus [id number]   Biopsy proven  . Menopause   . Mild aortic insufficiency   . Osteopenia   . Seasonal allergies    Past Surgical History:  Procedure Laterality Date  . ACHILLES TENDON SURGERY Right 04/25/2016   Procedure: ACHILLES TENDON REPAIR,PARTIAL EXCISION CALCANEOUS;  Surgeon: [name], MD;  Location: [place];  Service: Orthopedics;  Laterality: Right;  . APPENDECTOMY  1977  . BACK SURGERY    . BLEPHAROPLASTY Bilateral   . CARDIAC CATHETERIZATION  ~ 2014  . CARPAL TUNNEL RELEASE Right   . CATARACT EXTRACTION W/ INTRAOCULAR LENS  IMPLANT, BILATERAL Bilateral   . CERVICAL FUSION  1999   C5-7  . DILATATION & CURETTAGE/HYSTEROSCOPY WITH TRUECLEAR N/A 05/02/2013   Procedure: DILATATION & CURETTAGE/HYSTEROSCOPY WITH TRUECLEAR ;  Surgeon: Anastasio Auerbach, MD;  Location: [facility];  Service: Gynecology;  Laterality: N/A;  . DILATION AND CURETTAGE OF UTERUS    . HARDWARE REMOVAL Left 12/12/2013   Procedure:  LEFT PATELLA HARDWARE REMOVAL;  Surgeon: [name], MD;  Location: [place];  Service: Orthopedics;  Laterality: Left;  . HYSTEROSCOPY    . JOINT REPLACEMENT    . KNEE ARTHROSCOPY Left 12/12/2013   Procedure: LEFT PATELLA ARTHROSCOPY KNEE WITH DEBRIDEMENT/SHAVING (CONDROPLASTY), LYSIS OF ADHESIONS;  Surgeon: [name], MD;  Location: [place];  Service: Orthopedics;  Laterality: Left;  . LEFT HEART CATHETERIZATION WITH  CORONARY ANGIOGRAM N/A 04/22/2014   Procedure: LEFT HEART CATHETERIZATION WITH CORONARY ANGIOGRAM;  Surgeon: Sinclair Grooms, MD;  [facility];  Service: Cardiovascular;  Laterality: N/A;  . LUMBAR [place] SURGERY  2004   L5  . PARTIAL KNEE ARTHROPLASTY Left 01/13/2016   Procedure: LEFT UNICOMPARTMENTAL KNEE;  Surgeon: [name], MD;  Location: [place];  Service: Orthopedics;  Laterality: Left;  . PATELLA RECONSTRUCTION Left 1994  . REPLACEMENT UNICONDYLAR JOINT KNEE Left 01/13/2016  . RIGHT OOPHORECTOMY  1978  . TONSILLECTOMY       Current Meds  Medication Sig  . Alirocumab (PRALUENT) 75 MG/ML SOAJ Inject 75 mg into the skin every 14 (fourteen) days.  . B Complex Vitamins (VITAMIN B-COMPLEX PO) Take by mouth daily.  . Biotin w/ Vitamins C & E (HAIR/SKIN/NAILS PO) Take 1 tablet by mouth daily.  Marland Kitchen CALCIUM PO Take by mouth daily.  . chlorthalidone (HYGROTON) 25 MG tablet Take 25 mg by mouth daily.  . Cholecalciferol (VITAMIN D3) 5000 UNITS TABS Take 1 tablet by mouth every Monday, Wednesday, and Friday.   . DULoxetine (CYMBALTA) 20 MG capsule Take 20 mg by mouth daily.  . fluticasone (FLONASE) 50 MCG/ACT nasal spray as needed.  . hyoscyamine (LEVSIN/SL) 0.125 MG SL tablet  Place 1 tablet (0.125 mg total) under the tongue every 4 (four) hours as needed.  Lowanda Foster (XIIDRA OP) Apply to eye 2 (two) times daily. For dry eyes  . Probiotic Product (PROBIOTIC PO) Take 1-2 capsules by mouth as needed (for GI support).   . SYNTHROID 25 MCG tablet Take 25 mcg by mouth daily before breakfast.   . thyroid (ARMOUR) 90 MG tablet Take 60 mg by mouth daily.   . TURMERIC PO Take 1 capsule by mouth daily.  . [DISCONTINUED] chlorthalidone (HYGROTON) 25 MG tablet Take 0.5 tablets (12.5 mg total) by mouth daily. (Patient taking differently: Take 25 mg by mouth daily. )   Current Facility-Administered Medications for the 10/07/19 encounter (Telemedicine) with Charlie Pitter, PA-C  Medication  . 0.9  %  sodium chloride infusion     Allergies:   Aleve [naproxen sodium], Aspirin, Atorvastatin, Clarithromycin, Crestor  [rosuvastatin calcium], Durezol [difluprednate], Olopatadine hcl, Ondansetron, Penicillins, Pitavastatin, Statins, Crestor [rosuvastatin], Other, Prednisone, Adhesive [tape], and Zofran [ondansetron hcl]   Social History   Tobacco Use  . Smoking status: Former Smoker    Packs/day: 1.00    Years: 27.00    Pack years: 27.00    Types: Cigarettes    Quit date: 12/10/1983    Years since quitting: 35.8  . Smokeless tobacco: Never Used  Substance Use Topics  . Alcohol use: Yes    Comment: 01/13/2016 "I'll have a drink < 2 times/month"  . Drug use: No     Family Hx: The patient's family history includes Allergies in her mother; Heart disease in her father. There is no history of Colon cancer.  ROS:   Please see the history of present illness.  Occasional leg cramps at night, not severe - elicited only upon questioning. Also reports feeling a little down without being able to go many places due to pandemic/weather but has been getting out 1-2x a day walking her daughter's dog and is looking forward to spring. All other systems reviewed and are negative.   Prior CV studies:    Most recent pertinent cardiac studies are outlined and summarized above. Reports included below if pertinent.  2D echo 05/2019 1. Left ventricular ejection fraction, by visual estimation, is 60 to  65%. The left ventricle has normal function. Normal left ventricular size.  There is mildly increased left ventricular hypertrophy.  2. Left ventricular diastolic Doppler parameters are indeterminate  pattern of LV diastolic filling.  3. Global right ventricle has normal systolic function.The right  ventricular size is normal. No increase in right ventricular wall  thickness.  4. Left atrial size was normal.  5. Right atrial size was normal.  6. The mitral valve is normal in structure. No  evidence of mitral valve  regurgitation.  7. The tricuspid valve is normal in structure. Tricuspid valve  regurgitation was not visualized by color flow Doppler.  8. The aortic valve is tricuspid Aortic valve regurgitation is mild by  color flow Doppler.  9. The pulmonic valve was not well visualized. Pulmonic valve  regurgitation is not visualized by color flow Doppler.  10. The inferior vena cava is normal in size with greater than 50%  respiratory variability, suggesting right atrial pressure of 3 mmHg.     Labs/Other Tests and Data Reviewed:    EKG:  An ECG dated 06/18/19 was personally reviewed today and demonstrated:  normal sinus rhythm, rate 73 bpm, with LVH and chronic ST depression and T wave inversions in leads V3-V6 that have  been seen on tracings going back to 2013, nonspecific TW changes in other leads otherwise  Recent Labs: 06/13/2019: Hemoglobin 15.7; Platelets 322 08/21/2019: ALT 26; BUN 12; Creatinine, Ser 0.68; Potassium 3.7; Sodium 141; TSH 0.517   Recent Lipid Panel Lab Results  Component Value Date/Time   CHOL 145 08/21/2019 08:33 AM   TRIG 174 (H) 08/21/2019 08:33 AM   HDL 54 08/21/2019 08:33 AM   CHOLHDL 2.7 08/21/2019 08:33 AM   CHOLHDL 4 07/15/2014 08:35 AM   LDLCALC 62 08/21/2019 08:33 AM   LDLDIRECT 206.1 08/01/2007 09:17 AM    Wt Readings from Last 3 Encounters:  10/07/19 153 lb (69.4 kg)  08/28/19 153 lb (69.4 kg)  08/13/19 157 lb 1.9 oz (71.3 kg)     Objective:    Vital Signs:  BP 127/67   Pulse 64   Ht 5' (1.524 m)   Wt 153 lb (69.4 kg)   BMI 29.88 kg/m    VS reviewed. General - calm F in no acute distress Pulm - No labored breathing, no coughing during visit, no audible wheezing, speaking in full sentences Neuro - A+Ox3, no slurred speech, answers questions appropriately Psych - Pleasant affect     ASSESSMENT & PLAN:    1. Essential HTN with medication management - chlorthalidone was increased in late December 2020 and she  has done well since that time. She does report occasional leg cramps at night. Last K was 3.7 prior to med titration. Will have her start KCl 31meq daily with recheck BMET in 1 week to trend. Otherwise continue present regimen.   2. Hyperlipidemia - she reports that [facility] told her they have been trying to contact our office regarding Praluent refills but have not heard back. Updated labs were done 07/2019, so should not have a problem refilling - we have sent a message to our pharmD lipid clinic managers to help look into this and call her with further instructions.  See OV note from 07/2019 for comprehensive assessment of all cardiology issues, otherwise stable at this time.  Time:   Today, I have spent 10 minutes with the patient with telehealth technology discussing the above problems. Additional 5 minutes spent reviewing chart, preparing chart and reviewing labs.   Medication Adjustments/Labs and Tests Ordered: Current medicines are reviewed at length with the patient today.  Concerns regarding medicines are outlined above.   Follow Up: Dr. Meda Coffee in office in 1 year.  Signed, Charlie Pitter, PA-C  10/07/2019 10:29 AM    [place]

## 2019-10-07 ENCOUNTER — Other Ambulatory Visit: Payer: Self-pay

## 2019-10-07 ENCOUNTER — Telehealth (INDEPENDENT_AMBULATORY_CARE_PROVIDER_SITE_OTHER): Payer: Medicare Other | Admitting: Physician Assistant

## 2019-10-07 ENCOUNTER — Encounter: Payer: Self-pay | Admitting: Physician Assistant

## 2019-10-07 ENCOUNTER — Other Ambulatory Visit: Payer: Self-pay | Admitting: Pharmacist

## 2019-10-07 VITALS — BP 127/67 | HR 64 | Ht 60.0 in | Wt 153.0 lb

## 2019-10-07 DIAGNOSIS — I10 Essential (primary) hypertension: Secondary | ICD-10-CM

## 2019-10-07 DIAGNOSIS — Z79899 Other long term (current) drug therapy: Secondary | ICD-10-CM

## 2019-10-07 DIAGNOSIS — E782 Mixed hyperlipidemia: Secondary | ICD-10-CM

## 2019-10-07 MED ORDER — PRALUENT 75 MG/ML ~~LOC~~ SOAJ: 75.0000 mg | SUBCUTANEOUS | 11 refills | Status: DC

## 2019-10-07 MED ORDER — POTASSIUM CHLORIDE CRYS ER 20 MEQ PO TBCR: 20.0000 meq | EXTENDED_RELEASE_TABLET | Freq: Every day | ORAL | 3 refills | Status: DC

## 2019-10-07 NOTE — Telephone Encounter (Signed)
Patient notified that Rx sent

## 2019-10-07 NOTE — Patient Instructions (Signed)
Medication Instructions:  Your physician has recommended you make the following change in your medication:  1.  START Potassium 20 meq taking 1 tablet daily This has been sent to your pharmacy for you to pick up .  I have notified the pharmacy staff here at the office about your Praluent.  *If you need a refill on your cardiac medications before your next appointment, please call your pharmacy*  Lab Work: 10/14/2019:  COME TO THE OFFICE TO GET A BMET  If you have labs (blood work) drawn today and your tests are completely normal, you will receive your results only by: Marland Kitchen MyChart Message (if you have MyChart) OR . A paper copy in the mail If you have any lab test that is abnormal or we need to change your treatment, we will call you to review the results.  Testing/Procedures: None ordered  Follow-Up: At [facility], you and your health needs are our priority.  As part of our continuing mission to provide you with exceptional heart care, we have created designated Provider Care Teams.  These Care Teams include your primary Cardiologist (physician) and Advanced Practice Providers (APPs -  Physician Assistants and Nurse Practitioners) who all work together to provide you with the care you need, when you need it.  Your next appointment:   12 month(s)  The format for your next appointment:   In Person  Provider:   You may see Ena Dawley, MD or one of the following Advanced Practice Providers on your designated Care Team:    Melina Copa, PA-C  Ermalinda Barrios, PA-C   Other Instructions

## 2019-10-08 ENCOUNTER — Ambulatory Visit: Payer: Medicare Other | Attending: Internal Medicine

## 2019-10-08 DIAGNOSIS — Z23 Encounter for immunization: Secondary | ICD-10-CM | POA: Insufficient documentation

## 2019-10-08 NOTE — Progress Notes (Signed)
   Covid-19 Vaccination Clinic  Name:  Marilyn Rivas    MRN: [id number] DOB: Jul 11, 1941  10/08/2019  [name] was observed post Covid-19 immunization for 15 minutes without incidence. She was provided with Vaccine Information Sheet and instruction to access the V-Safe system.   [name] was instructed to call 911 with any severe reactions post vaccine: Marland Kitchen Difficulty breathing  . Swelling of your face and throat  . A fast heartbeat  . A bad rash all over your body  . Dizziness and weakness    Immunizations Administered    Name Date Dose VIS Date Route   Pfizer COVID-19 Vaccine 10/08/2019  2:34 PM 0.3 mL 08/09/2019 Intramuscular   Manufacturer: [place]   Lot: [id number]   [place]: [id number]

## 2019-10-11 ENCOUNTER — Telehealth: Payer: Self-pay | Admitting: Pharmacist

## 2019-10-11 ENCOUNTER — Other Ambulatory Visit: Payer: Self-pay | Admitting: *Deleted

## 2019-10-11 MED ORDER — CHLORTHALIDONE 25 MG PO TABS: 25.0000 mg | ORAL_TABLET | Freq: Every day | ORAL | 1 refills | Status: DC

## 2019-10-11 MED ORDER — PRALUENT 75 MG/ML ~~LOC~~ SOAJ: 75.0000 mg | SUBCUTANEOUS | 11 refills | Status: DC

## 2019-10-11 NOTE — Telephone Encounter (Signed)
Called pt about her Praluent copay - she had self-enrolled in the [employer] last year, looks like her enrollment expired. Called pt back and was able to re-enroll her. New rx sent to pharmacy and provided them again with grant info. They stated rx went to their 3rd party resolution center. Provided them with clinic call back # if there are any issues.

## 2019-10-14 ENCOUNTER — Other Ambulatory Visit: Payer: Medicare Other

## 2019-10-14 ENCOUNTER — Other Ambulatory Visit: Payer: Self-pay

## 2019-10-14 DIAGNOSIS — E782 Mixed hyperlipidemia: Secondary | ICD-10-CM

## 2019-10-14 DIAGNOSIS — I10 Essential (primary) hypertension: Secondary | ICD-10-CM

## 2019-10-14 DIAGNOSIS — Z79899 Other long term (current) drug therapy: Secondary | ICD-10-CM

## 2019-10-15 LAB — BASIC METABOLIC PANEL
BUN/Creatinine Ratio: 16 (ref 12–28)
BUN: 14 mg/dL (ref 8–27)
CO2: 24 mmol/L (ref 20–29)
Calcium: 10.3 mg/dL (ref 8.7–10.3)
Chloride: 100 mmol/L (ref 96–106)
Creatinine, Ser: 0.86 mg/dL (ref 0.57–1.00)
GFR calc Af Amer: 74 mL/min/{1.73_m2} (ref >59)
GFR calc non Af Amer: 64 mL/min/{1.73_m2} (ref >59)
Glucose: 164 mg/dL — ABNORMAL HIGH (ref 65–99)
Potassium: 3.9 mmol/L (ref 3.5–5.2)
Sodium: 141 mmol/L (ref 134–144)

## 2020-04-13 ENCOUNTER — Telehealth: Payer: Self-pay | Admitting: Cardiology

## 2020-04-13 NOTE — Telephone Encounter (Signed)
Pt is calling to ask the Pharmacist and Dr. Meda Coffee if they would take a look at all her medications and help determine which one would be causing her to have extreme dry mouth and dry throat.  Pt states its so dry, that she has to wake up in the middle of the night, and eat hard candy to moisten her mouth.  Pt states she thinks its her chlorthalidone, despite being on this for several months.  Pt states her symptoms of dry mouth have only recently started, and she had no issues around the time when chlorthalidone was prescribed.  Asked the pt if there is anything else she has started taking recently, prior to when her chlorthalidone was prescribed.  Pt states she has started Cymbalta for anxiety and depression, and Primrose oil to help with night sweats and sweating in general.  Pt states she prefers not coming off her antidepressant though, for this has helped her with her anxiety during this pandemic.  Informed the pt that I will route this message to our Pharmacist and Dr. Meda Coffee for further advisement.  Informed the pt that I will follow-up with her shortly thereafter, once further recommendations are provided.  Pt verbalized understanding and agrees with this plan.

## 2020-04-13 NOTE — Telephone Encounter (Signed)
Spoke with the pt and informed her that per Dr. Meda Coffee, she would like for her to hold her chlorthalidone for one week, then call us back thereafter, to report if her symptoms improved or not. Hold med x 1 week note placed under chlorthalidone, in her med list.  Pt verbalized understanding and agrees with this plan.  Pt states she will hold this for one week and call us to report her symptoms thereafter. Pt appreciative for all the assistance.

## 2020-04-13 NOTE — Telephone Encounter (Signed)
Pt c/o medication issue:  1. Name of Medication:   chlorthalidone (HYGROTON) 25 MG tablet   2. How are you currently taking this medication (dosage and times per day)? 1 tablet by mouth daily   3. Are you having a reaction (difficulty breathing--STAT)? Yes   4. What is your medication issue? Marilyn Rivas is calling stating since increasing this medication she has noticed she is waking up during the night with extreme dry mouth and throat. She states it is also occurring during the day some and she constantly drinks water. Please advise.

## 2020-04-13 NOTE — Telephone Encounter (Signed)
Have her hold chlorthalidone for a week and see if that helps.  Thank you

## 2020-04-28 NOTE — Telephone Encounter (Signed)
Patient is calling to update Dr. Meda Coffee. She states she has not taken chlorthalidone and she no longer has a dry mouth and throat.

## 2020-04-28 NOTE — Telephone Encounter (Signed)
I would just continue holding it

## 2020-04-28 NOTE — Telephone Encounter (Signed)
Dr. Meda Coffee, you advised this pt to call us back in one week after you held her chlorthalidone, to advise if her symptoms of dry mouth and dry throat improved with hold, or not. Pt states she held this for one week and is calling to say she is symptom free now, and no longer has the dry throat/mouth.  She is aware that I will forward this to you for further advisement and follow-up with her accordingly thereafter.  Please advise!

## 2020-04-29 NOTE — Addendum Note (Signed)
Addended by: Nuala Alpha on: 04/29/2020 08:22 AM   Modules accepted: Orders

## 2020-04-29 NOTE — Telephone Encounter (Signed)
Spoke with the pt and informed her that per Dr. Meda Coffee, she recommends that she continue holding her chlorthalidone.  Will discontinue this from the pts med list.  Pt verbalized understanding and agrees with this plan.

## 2020-05-05 ENCOUNTER — Telehealth: Payer: Self-pay | Admitting: *Deleted

## 2020-05-05 NOTE — Telephone Encounter (Signed)
   [facility] Pre-operative Risk Assessment    HEARTCARE STAFF: - Please ensure there is not already an duplicate clearance open for this procedure. - Under Visit Info/Reason for Call, type in Other and utilize the format Clearance MM/DD/YY or Clearance TBD. Do not use dashes or single digits. - If request is for dental extraction, please clarify the # of teeth to be extracted.  Request for surgical clearance:  1. What type of surgery is being performed?  LET ORIF DISTAL RADIUS   2. When is this surgery scheduled?  05/07/20   3. What type of clearance is required (medical clearance vs. Pharmacy clearance to hold med vs. Both)?  MEDICAL  4. Are there any medications that need to be held prior to surgery and how long? N/A   5. Practice name and name of physician performing surgery?  [facility] / DR. VARKEY   6. What is the office phone number?  [phone] XT: [phone]   8.   What is the office fax number?  [phone] ATTN:  SHERRI   8.   Anesthesia type (None, local, MAC, general) ?     Marilyn Rivas 05/05/2020, 4:01 PM  _________________________________________________________________   (provider comments below)

## 2020-05-05 NOTE — Telephone Encounter (Signed)
   Primary Cardiologist: Ena Dawley, MD  Chart reviewed and patient contacted by phone as part of pre-operative protocol coverage. Given past medical history and time since last visit, based on ACC/AHA guidelines, Leyla B Maloney would be at acceptable risk for the planned procedure without further cardiovascular testing.   The patient was advised that if she develops new symptoms prior to surgery to contact our office to arrange for a follow-up visit, and she verbalized understanding.  I will route this recommendation to the requesting party via Epic fax function and remove from pre-op pool.  Please call with questions.  Kerin Ransom, PA-C 05/05/2020, 4:49 PM

## 2020-05-06 ENCOUNTER — Encounter (HOSPITAL_BASED_OUTPATIENT_CLINIC_OR_DEPARTMENT_OTHER): Payer: Self-pay | Admitting: Orthopaedic Surgery

## 2020-05-06 ENCOUNTER — Other Ambulatory Visit: Payer: Self-pay

## 2020-05-06 ENCOUNTER — Other Ambulatory Visit (HOSPITAL_COMMUNITY)
Admission: RE | Admit: 2020-05-06 | Discharge: 2020-05-06 | Disposition: A | Discharge status: Home / Self Care | Payer: Medicare Other | Source: Ambulatory Visit | Attending: Orthopaedic Surgery | Admitting: Orthopaedic Surgery

## 2020-05-06 DIAGNOSIS — Z20822 Contact with and (suspected) exposure to covid-19: Secondary | ICD-10-CM | POA: Insufficient documentation

## 2020-05-06 DIAGNOSIS — Z01818 Encounter for other preprocedural examination: Secondary | ICD-10-CM | POA: Insufficient documentation

## 2020-05-06 LAB — SARS CORONAVIRUS 2 (TAT 6-24 HRS): SARS Coronavirus 2: NEGATIVE

## 2020-05-07 ENCOUNTER — Encounter (HOSPITAL_BASED_OUTPATIENT_CLINIC_OR_DEPARTMENT_OTHER)
Admission: RE | Disposition: A | Discharge status: Home / Self Care | Payer: Self-pay | Source: Home / Self Care | Attending: Orthopaedic Surgery

## 2020-05-07 ENCOUNTER — Ambulatory Visit (HOSPITAL_BASED_OUTPATIENT_CLINIC_OR_DEPARTMENT_OTHER)
Admission: RE | Admit: 2020-05-07 | Discharge: 2020-05-07 | Disposition: A | Discharge status: Home / Self Care | Payer: Medicare Other | Attending: Orthopaedic Surgery | Admitting: Orthopaedic Surgery

## 2020-05-07 ENCOUNTER — Encounter (HOSPITAL_BASED_OUTPATIENT_CLINIC_OR_DEPARTMENT_OTHER): Payer: Self-pay | Admitting: Orthopaedic Surgery

## 2020-05-07 ENCOUNTER — Ambulatory Visit (HOSPITAL_BASED_OUTPATIENT_CLINIC_OR_DEPARTMENT_OTHER): Payer: Medicare Other | Admitting: Anesthesiology

## 2020-05-07 ENCOUNTER — Other Ambulatory Visit: Payer: Self-pay

## 2020-05-07 DIAGNOSIS — W19XXXA Unspecified fall, initial encounter: Secondary | ICD-10-CM | POA: Diagnosis not present

## 2020-05-07 DIAGNOSIS — Z96652 Presence of left artificial knee joint: Secondary | ICD-10-CM | POA: Diagnosis not present

## 2020-05-07 DIAGNOSIS — Z886 Allergy status to analgesic agent status: Secondary | ICD-10-CM | POA: Diagnosis not present

## 2020-05-07 DIAGNOSIS — S52572A Other intraarticular fracture of lower end of left radius, initial encounter for closed fracture: Secondary | ICD-10-CM | POA: Insufficient documentation

## 2020-05-07 DIAGNOSIS — M1990 Unspecified osteoarthritis, unspecified site: Secondary | ICD-10-CM | POA: Insufficient documentation

## 2020-05-07 DIAGNOSIS — Z981 Arthrodesis status: Secondary | ICD-10-CM | POA: Insufficient documentation

## 2020-05-07 DIAGNOSIS — K219 Gastro-esophageal reflux disease without esophagitis: Secondary | ICD-10-CM | POA: Diagnosis not present

## 2020-05-07 DIAGNOSIS — Z9842 Cataract extraction status, left eye: Secondary | ICD-10-CM | POA: Diagnosis not present

## 2020-05-07 DIAGNOSIS — I351 Nonrheumatic aortic (valve) insufficiency: Secondary | ICD-10-CM | POA: Insufficient documentation

## 2020-05-07 DIAGNOSIS — F419 Anxiety disorder, unspecified: Secondary | ICD-10-CM | POA: Diagnosis not present

## 2020-05-07 DIAGNOSIS — Z880 Allergy status to penicillin: Secondary | ICD-10-CM | POA: Diagnosis not present

## 2020-05-07 DIAGNOSIS — Z87891 Personal history of nicotine dependence: Secondary | ICD-10-CM | POA: Insufficient documentation

## 2020-05-07 DIAGNOSIS — Z79899 Other long term (current) drug therapy: Secondary | ICD-10-CM | POA: Insufficient documentation

## 2020-05-07 DIAGNOSIS — E785 Hyperlipidemia, unspecified: Secondary | ICD-10-CM | POA: Diagnosis not present

## 2020-05-07 DIAGNOSIS — I10 Essential (primary) hypertension: Secondary | ICD-10-CM | POA: Insufficient documentation

## 2020-05-07 DIAGNOSIS — F329 Major depressive disorder, single episode, unspecified: Secondary | ICD-10-CM | POA: Diagnosis not present

## 2020-05-07 DIAGNOSIS — Z9841 Cataract extraction status, right eye: Secondary | ICD-10-CM | POA: Insufficient documentation

## 2020-05-07 DIAGNOSIS — Z881 Allergy status to other antibiotic agents status: Secondary | ICD-10-CM | POA: Diagnosis not present

## 2020-05-07 DIAGNOSIS — Z86010 Personal history of colonic polyps: Secondary | ICD-10-CM | POA: Insufficient documentation

## 2020-05-07 DIAGNOSIS — Z91048 Other nonmedicinal substance allergy status: Secondary | ICD-10-CM | POA: Insufficient documentation

## 2020-05-07 DIAGNOSIS — E039 Hypothyroidism, unspecified: Secondary | ICD-10-CM | POA: Diagnosis not present

## 2020-05-07 DIAGNOSIS — Y939 Activity, unspecified: Secondary | ICD-10-CM | POA: Insufficient documentation

## 2020-05-07 DIAGNOSIS — L900 Lichen sclerosus et atrophicus: Secondary | ICD-10-CM | POA: Diagnosis not present

## 2020-05-07 DIAGNOSIS — Z888 Allergy status to other drugs, medicaments and biological substances status: Secondary | ICD-10-CM | POA: Insufficient documentation

## 2020-05-07 DIAGNOSIS — M8580 Other specified disorders of bone density and structure, unspecified site: Secondary | ICD-10-CM | POA: Diagnosis not present

## 2020-05-07 DIAGNOSIS — Z961 Presence of intraocular lens: Secondary | ICD-10-CM | POA: Insufficient documentation

## 2020-05-07 DIAGNOSIS — Z90721 Acquired absence of ovaries, unilateral: Secondary | ICD-10-CM | POA: Insufficient documentation

## 2020-05-07 HISTORY — PX: OPEN REDUCTION INTERNAL FIXATION (ORIF) DISTAL RADIAL FRACTURE: SHX5989

## 2020-05-07 HISTORY — DX: Unspecified fracture of the lower end of left radius, initial encounter for closed fracture: S52.502A

## 2020-05-07 HISTORY — DX: Essential (primary) hypertension: I10

## 2020-05-07 SURGERY — OPEN REDUCTION INTERNAL FIXATION (ORIF) DISTAL RADIUS FRACTURE
Anesthesia: Monitor Anesthesia Care | Site: Wrist | Laterality: Left

## 2020-05-07 MED ORDER — LIDOCAINE 2% (20 MG/ML) 5 ML SYRINGE
INTRAMUSCULAR | Status: AC
  Filled 2020-05-07: qty 5

## 2020-05-07 MED ORDER — LACTATED RINGERS IV SOLN: INTRAVENOUS | Status: DC

## 2020-05-07 MED ORDER — CLINDAMYCIN PHOSPHATE 900 MG/50ML IV SOLN
INTRAVENOUS | Status: AC
  Filled 2020-05-07: qty 50

## 2020-05-07 MED ORDER — ACETAMINOPHEN 500 MG PO TABS
500.0000 mg | ORAL_TABLET | Freq: Once | ORAL | Status: AC
  Administered 2020-05-07: 500 mg via ORAL

## 2020-05-07 MED ORDER — ACETAMINOPHEN 500 MG PO TABS: 1000.0000 mg | ORAL_TABLET | Freq: Once | ORAL | Status: DC

## 2020-05-07 MED ORDER — MIDAZOLAM HCL 5 MG/5ML IJ SOLN
INTRAMUSCULAR | Status: DC | PRN
  Administered 2020-05-07: 1 mg via INTRAVENOUS

## 2020-05-07 MED ORDER — FENTANYL CITRATE (PF) 100 MCG/2ML IJ SOLN
INTRAMUSCULAR | Status: AC
  Filled 2020-05-07: qty 2

## 2020-05-07 MED ORDER — PROMETHAZINE HCL 25 MG/ML IJ SOLN: 6.2500 mg | INTRAMUSCULAR | Status: DC | PRN

## 2020-05-07 MED ORDER — VANCOMYCIN HCL 1000 MG IV SOLR
INTRAVENOUS | Status: DC | PRN
  Administered 2020-05-07: 500 mg via TOPICAL

## 2020-05-07 MED ORDER — FENTANYL CITRATE (PF) 100 MCG/2ML IJ SOLN: 25.0000 ug | INTRAMUSCULAR | Status: DC | PRN

## 2020-05-07 MED ORDER — OXYCODONE HCL 5 MG PO TABS: ORAL_TABLET | ORAL | 0 refills | Status: AC

## 2020-05-07 MED ORDER — MIDAZOLAM HCL 2 MG/2ML IJ SOLN
INTRAMUSCULAR | Status: AC
  Filled 2020-05-07: qty 2

## 2020-05-07 MED ORDER — ACETAMINOPHEN 500 MG PO TABS
ORAL_TABLET | ORAL | Status: AC
  Filled 2020-05-07: qty 2

## 2020-05-07 MED ORDER — BUPIVACAINE LIPOSOME 1.3 % IJ SUSP
INTRAMUSCULAR | Status: DC | PRN
  Administered 2020-05-07: 10 mL via PERINEURAL

## 2020-05-07 MED ORDER — CLINDAMYCIN PHOSPHATE 900 MG/50ML IV SOLN
900.0000 mg | INTRAVENOUS | Status: AC
  Administered 2020-05-07: 900 mg via INTRAVENOUS

## 2020-05-07 MED ORDER — PROPOFOL 500 MG/50ML IV EMUL
INTRAVENOUS | Status: DC | PRN
  Administered 2020-05-07: 75 ug/kg/min via INTRAVENOUS

## 2020-05-07 MED ORDER — BUPIVACAINE HCL (PF) 0.5 % IJ SOLN
INTRAMUSCULAR | Status: DC | PRN
  Administered 2020-05-07: 15 mL via PERINEURAL

## 2020-05-07 MED ORDER — PROPOFOL 10 MG/ML IV BOLUS
INTRAVENOUS | Status: AC
  Filled 2020-05-07: qty 20

## 2020-05-07 MED ORDER — FENTANYL CITRATE (PF) 100 MCG/2ML IJ SOLN
50.0000 ug | Freq: Once | INTRAMUSCULAR | Status: AC
  Administered 2020-05-07: 100 ug via INTRAVENOUS

## 2020-05-07 MED ORDER — ACETAMINOPHEN 500 MG PO TABS: 1000.0000 mg | ORAL_TABLET | Freq: Three times a day (TID) | ORAL | 0 refills | Status: AC

## 2020-05-07 SURGICAL SUPPLY — 68 items
BIT DRILL 2.2 SS TIBIAL (BIT) ×3 IMPLANT
BLADE HEX COATED 2.75 (ELECTRODE) ×3 IMPLANT
BLADE SURG 15 STRL LF DISP TIS (BLADE) ×1 IMPLANT
BLADE SURG 15 STRL SS (BLADE) ×3
BNDG CMPR 9X4 STRL LF SNTH (GAUZE/BANDAGES/DRESSINGS) ×1
BNDG COHESIVE 3X5 TAN STRL LF (GAUZE/BANDAGES/DRESSINGS) ×3 IMPLANT
BNDG ELASTIC 3X5.8 VLCR STR LF (GAUZE/BANDAGES/DRESSINGS) ×3 IMPLANT
BNDG ELASTIC 4X5.8 VLCR STR LF (GAUZE/BANDAGES/DRESSINGS) ×3 IMPLANT
BNDG ESMARK 4X9 LF (GAUZE/BANDAGES/DRESSINGS) ×3 IMPLANT
BRUSH SCRUB EZ PLAIN DRY (MISCELLANEOUS) ×3 IMPLANT
CANISTER SUCT 1200ML W/VALVE (MISCELLANEOUS) IMPLANT
CLOSURE STERI-STRIP 1/2X4 (GAUZE/BANDAGES/DRESSINGS) ×1
CLSR STERI-STRIP ANTIMIC 1/2X4 (GAUZE/BANDAGES/DRESSINGS) ×2 IMPLANT
COVER BACK TABLE 60X90IN (DRAPES) ×3 IMPLANT
COVER WAND RF STERILE (DRAPES) IMPLANT
CUFF TOURN SGL QUICK 18X4 (TOURNIQUET CUFF) ×3 IMPLANT
DECANTER SPIKE VIAL GLASS SM (MISCELLANEOUS) IMPLANT
DRAPE EXTREMITY T 121X128X90 (DISPOSABLE) ×3 IMPLANT
DRAPE IMP U-DRAPE 54X76 (DRAPES) ×3 IMPLANT
DRAPE OEC MINIVIEW 54X84 (DRAPES) ×3 IMPLANT
DRAPE SURG 17X23 STRL (DRAPES) ×3 IMPLANT
ELECT REM PT RETURN 9FT ADLT (ELECTROSURGICAL) ×3
ELECTRODE REM PT RTRN 9FT ADLT (ELECTROSURGICAL) ×1 IMPLANT
GAUZE SPONGE 4X4 12PLY STRL (GAUZE/BANDAGES/DRESSINGS) ×3 IMPLANT
GLOVE BIO SURGEON STRL SZ 6.5 (GLOVE) ×2 IMPLANT
GLOVE BIO SURGEONS STRL SZ 6.5 (GLOVE) ×1
GLOVE BIOGEL PI IND STRL 6.5 (GLOVE) ×1 IMPLANT
GLOVE BIOGEL PI IND STRL 8 (GLOVE) ×1 IMPLANT
GLOVE BIOGEL PI INDICATOR 6.5 (GLOVE) ×2
GLOVE BIOGEL PI INDICATOR 8 (GLOVE) ×2
GLOVE ECLIPSE 8.0 STRL XLNG CF (GLOVE) ×3 IMPLANT
GOWN STRL REUS W/ TWL LRG LVL3 (GOWN DISPOSABLE) ×1 IMPLANT
GOWN STRL REUS W/ TWL XL LVL3 (GOWN DISPOSABLE) IMPLANT
GOWN STRL REUS W/TWL LRG LVL3 (GOWN DISPOSABLE) ×3
GOWN STRL REUS W/TWL XL LVL3 (GOWN DISPOSABLE) ×3 IMPLANT
K-WIRE 1.6 (WIRE) ×6
K-WIRE FX5X1.6XNS BN SS (WIRE) ×2
KWIRE FX5X1.6XNS BN SS (WIRE) ×2 IMPLANT
NEEDLE HYPO 25X1 1.5 SAFETY (NEEDLE) ×3 IMPLANT
NS IRRIG 1000ML POUR BTL (IV SOLUTION) IMPLANT
PACK BASIN DAY SURGERY FS (CUSTOM PROCEDURE TRAY) ×3 IMPLANT
PAD CAST 4YDX4 CTTN HI CHSV (CAST SUPPLIES) ×1 IMPLANT
PADDING CAST COTTON 4X4 STRL (CAST SUPPLIES) ×3
PEG LOCKING SMOOTH 2.2X16 (Screw) ×6 IMPLANT
PEG LOCKING SMOOTH 2.2X18 (Peg) ×9 IMPLANT
PENCIL SMOKE EVACUATOR (MISCELLANEOUS) ×3 IMPLANT
PLATE NARROW DVR LEFT (Plate) ×3 IMPLANT
SCREW LOCK 14X2.7X 3 LD TPR (Screw) ×2 IMPLANT
SCREW LOCKING 2.7X13MM (Screw) ×3 IMPLANT
SCREW LOCKING 2.7X14 (Screw) ×6 IMPLANT
SCREW NONLOCK 2.7X18MM (Screw) ×3 IMPLANT
SLEEVE SCD COMPRESS KNEE MED (MISCELLANEOUS) ×3 IMPLANT
SLING ARM FOAM STRAP MED (SOFTGOODS) ×3 IMPLANT
SPLINT PLASTER CAST XFAST 3X15 (CAST SUPPLIES) ×10 IMPLANT
SPLINT PLASTER XTRA FASTSET 3X (CAST SUPPLIES) ×20
SPONGE LAP 4X18 RFD (DISPOSABLE) IMPLANT
SUCTION FRAZIER HANDLE 10FR (MISCELLANEOUS)
SUCTION TUBE FRAZIER 10FR DISP (MISCELLANEOUS) IMPLANT
SUT MNCRL AB 4-0 PS2 18 (SUTURE) ×3 IMPLANT
SUT VIC AB 3-0 SH 27 (SUTURE)
SUT VIC AB 3-0 SH 27X BRD (SUTURE) IMPLANT
SYR BULB EAR ULCER 3OZ GRN STR (SYRINGE) ×3 IMPLANT
SYR CONTROL 10ML LL (SYRINGE) ×3 IMPLANT
TOWEL GREEN STERILE FF (TOWEL DISPOSABLE) ×3 IMPLANT
TRAY DSU PREP LF (CUSTOM PROCEDURE TRAY) ×3 IMPLANT
TUBE CONNECTING 20'X1/4 (TUBING) ×1
TUBE CONNECTING 20X1/4 (TUBING) ×2 IMPLANT
YANKAUER SUCT BULB TIP NO VENT (SUCTIONS) IMPLANT

## 2020-05-07 NOTE — Op Note (Signed)
Orthopaedic Surgery Operative Note (CSN: [id number])  Marilyn Rivas  [birth year] Date of Surgery: 05/07/2020   Diagnoses:  LEFT DISTAL RADIUS FRACTURE  Procedure: Left intra-articular greater than 3 part distal radius open reduction internal fixation   Operative Finding Successful completion of the planned procedure.  Right wrist was significantly comminuted bone quality was poor.  We were able to get an anatomic reduction however and the fixation was reasonable.  We had to use nonabsorbable sutures of these will stay in for 2 to 4 weeks.  Post-operative plan: The patient will be demisplint for a week and removable splint afterwards.  The patient will be nonweightbearing and discharged home.  DVT prophylaxis not indicated in this ambulatory upper extremity patient without significant risk factors.   Pain control with PRN pain medication preferring oral medicines.  Follow up plan will be scheduled in approximately 7 days for incision check and XR.  Post-Op Diagnosis: Same Surgeons:Primary: Hiram Gash, MD Assistants:Caroline McBane PA-C Location: [place] OR ROOM 6 Anesthesia: Sedation plus regional anesthesia Antibiotics: Ancef 2 g with local vancomycin powder 1 g at the surgical site Tourniquet time:  Total Tourniquet Time Documented: Upper Arm (Left) - 24 minutes Total: Upper Arm (Left) - 24 minutes  Estimated Blood Loss: Minimal Complications: None Specimens: None Implants: Implant Name Type Inv. Item Serial No. Manufacturer Lot No. LRB No. Used Action  PLATE NARROW DVR LEFT - [id number] Plate PLATE NARROW DVR LEFT  [employer]IMMER [employer]ON(ORTH,TRAU,BIO,SG)  Left 1 Implanted  PEG LOCKING SMOOTH 2.2X16 - [id number] Screw PEG LOCKING SMOOTH 2.2X16  [employer]ON(ORTH,TRAU,BIO,SG)  Left 2 Implanted  PEG LOCKING SMOOTH 2.2X18 - [id number] Peg PEG LOCKING SMOOTH 2.2X18  ZIMMER [employer]ON(ORTH,TRAU,BIO,SG)  Left 3 Implanted  SCREW NONLOCK 2.7X18MM - [id number] Screw SCREW NONLOCK 2.7X18MM  ZIMMER  RECON(ORTH,TRAU,BIO,SG)  Left 1 Implanted  SCREW LOCKING 2.7X14 - [id number] Screw SCREW LOCKING 2.7X14  ZIMMER [employer]ON(ORTH,TRAU,BIO,SG)  Left 2 Implanted  SCREW LOCKING 2.7X13MM - [id number] Screw SCREW LOCKING 2.7X13MM  ZIMMER [employer]ON(ORTH,TRAU,BIO,SG)  Left 1 Implanted    Indications for Surgery:   Marilyn Rivas is a 79 y.o. female with fall resulting in a complex intra-articular distal radius fractur.  Benefits and risks of operative and nonoperative management were discussed prior to surgery with patient/guardian(s) and informed consent form was completed.  Specific risks including infection, need for additional surgery, nonunion, malunion, hardware prominence, CRPS amongst others   Procedure:   The patient was identified properly. Informed consent was obtained and the surgical site was marked. The patient was taken up to suite where general anesthesia was induced.  The patient was positioned supine on a hand table.  The left wrist was prepped and draped in the usual sterile fashion.  Timeout was performed before the beginning of the case.  Tourniquet was used for the above duration.  An FCR approach was made exposing the volar surface of the distal radius taking care to go through the sheath of the FCR tendon tract and ulnarly exposing the inferior portion of the sheath while protecting the median nerve and radial artery on each side with blunt retractors.  This inferior portion of the sheath was incised sharply and examined for presence of the palmar cutaneous branch of the median nerve.  It was determined to not be within the field and we carried our dissection deeply to the bone splitting the pronator quadratus and exposing the fracture site.    Appropriate reduction was obtained and a short narrow DVR Biomet plate was placed and checked  for sizing and reduction under fluoroscopy.  This reduction was held in place with K wires and a K wire was placed into the radial styloid.    Once  appropriate reduction was confirmed we then proceeded to fix the plate proximally and then proceeded to fill the distal holes with a combination of partially threaded screws and pegs.  At this point we checked our reduction to ensure that there was no intra-articular extension of our screws.  Once this was confirmed we proceeded to fill remaining 3 proximal shaft screws and obtained final images which demonstrated appropriate reduction and maintenance of alignment.  The DRUJ was checked and found to be stable.  We verified that all fast guides were removed on XR and through count.    The wound was thoroughly irrigated.  The tourniquet was released prior to skin closure to verify there was no excessive bleeding and we visualized that the radial artery and median nerve were intact at the end of the case. The PQ was reapproximated grossly prior to skin closure.     We irrigated the wound copiously before placing local antibiotic as listed above.  We closed the incision in a multilayer fashion with nonabsorbable suture due to the patient significant soft tissue ecchymoses and thin skin.  Sterile dressing was placed.  Patient was awoken taken to PACU in stable condition.  Noemi Chapel, PA-C, present and scrubbed throughout the case, critical for completion in a timely fashion, and for retraction, instrumentation, closure.

## 2020-05-07 NOTE — H&P (Signed)
PREOPERATIVE H&P  Chief Complaint: LEFT DISTAL RADIUS FRACTURE  HPI: Marilyn Rivas is a 79 y.o. female who is scheduled for OPEN REDUCTION INTERNAL FIXATION (ORIF) DISTAL RADIAL FRACTURE.   Patient has a past medical history significant for hypothyroidism, hypertension, hyperlipidemia, diverticulosis, GERD, aortic insufficiency.   Patient had a fall on 05/01/2020. She tried to catch herself and landed on her left wrist. She was seen at [facility] Emergency Department.  Xrays showed displaced distal radius fracture. She was placed in sugar tong splint and told to follow-up with orthopedics.   Her symptoms are rated as moderate to severe, and have been worsening.  This is significantly impairing activities of daily living.  Please see clinic note for further details on this patient's care.    She has elected for surgical management.   Past Medical History:  Diagnosis Date  . Anxiety   . Arthritis    "left; maybe back" (01/13/2016)  . Cancer ([place])    skin pre-cancer  . Closed fracture of left distal radius   . Depression   . Diverticulitis   . Diverticulosis   . Family history of adverse reaction to anesthesia    "daughter gets really nauseous & has trouble getting intubated" (01/13/2016)  . GERD (gastroesophageal reflux disease)    HISTORY  . History of blood transfusion 1977   "related to OR"  . Hx of adenomatous colonic polyps   . Hyperlipidemia   . Hypertension   . Hypothyroidism   . Lichen sclerosus 04/5187   Biopsy proven  . Menopause   . Mild aortic insufficiency   . Osteopenia   . Seasonal allergies    Past Surgical History:  Procedure Laterality Date  . ACHILLES TENDON SURGERY Right 04/25/2016   Procedure: ACHILLES TENDON REPAIR,PARTIAL EXCISION CALCANEOUS;  Surgeon: [name], MD;  Location: [place];  Service: Orthopedics;  Laterality: Right;  . APPENDECTOMY  1977  . BACK SURGERY    . BLEPHAROPLASTY Bilateral   . CARDIAC  CATHETERIZATION  ~ 2014  . CARPAL TUNNEL RELEASE Right   . CATARACT EXTRACTION W/ INTRAOCULAR LENS  IMPLANT, BILATERAL Bilateral   . CERVICAL FUSION  1999   C5-7  . DILATATION & CURETTAGE/HYSTEROSCOPY WITH TRUECLEAR N/A 05/02/2013   Procedure: DILATATION & CURETTAGE/HYSTEROSCOPY WITH TRUECLEAR ;  Surgeon: Anastasio Auerbach, MD;  Location: [facility];  Service: Gynecology;  Laterality: N/A;  . DILATION AND CURETTAGE OF UTERUS    . HARDWARE REMOVAL Left 12/12/2013   Procedure:  LEFT PATELLA HARDWARE REMOVAL;  Surgeon: [name], MD;  Location: [place];  Service: Orthopedics;  Laterality: Left;  . HYSTEROSCOPY    . JOINT REPLACEMENT    . KNEE ARTHROSCOPY Left 12/12/2013   Procedure: LEFT PATELLA ARTHROSCOPY KNEE WITH DEBRIDEMENT/SHAVING (CONDROPLASTY), LYSIS OF ADHESIONS;  Surgeon: [name], MD;  Location: [place];  Service: Orthopedics;  Laterality: Left;  . LEFT HEART CATHETERIZATION WITH CORONARY ANGIOGRAM N/A 04/22/2014   Procedure: LEFT HEART CATHETERIZATION WITH CORONARY ANGIOGRAM;  Surgeon: Sinclair Grooms, MD;  Location[facility] C[facility] LAB;  Service: Cardiovascular;  Laterality: N/A;  . LUMBAR [place] SURGERY  2004   L5  . PARTIAL KNEE ARTHROPLASTY Left 01/13/2016   Procedure: LEFT UNICOMPARTMENTAL KNEE;  Surgeon: [name], MD;  Location: [place];  Service: Orthopedics;  Laterality: Left;  . PATELLA RECONSTRUCTION Left 1994  . REPLACEMENT UNICONDYLAR JOINT KNEE Left 01/13/2016  . RIGHT OOPHORECTOMY  1978  . TONSILLECTOMY  Social History   Socioeconomic History  . Marital status: Divorced    Spouse name: Not on file  . Number of children: 2  . Years of education: Not on file  . Highest education level: Not on file  Occupational History  . Occupation: RESEARCH ASSIST    Employer: [employer]    Comment: Retired   Tobacco Use  . Smoking status: Former Smoker    Packs/day: 1.00    Years: 27.00    Pack years: 27.00    Types:  Cigarettes    Quit date: 12/10/1983    Years since quitting: 36.4  . Smokeless tobacco: Never Used  Vaping Use  . Vaping Use: Never used  Substance and Sexual Activity  . Alcohol use: Yes    Comment: 01/13/2016 "I'll have a drink < 2 times/month"  . Drug use: No  . Sexual activity: Never    Birth control/protection: Post-menopausal  Other Topics Concern  . Not on file  Social History Narrative   Daily caffeine    Social Determinants of Health   Financial Resource Strain:   . Difficulty of Paying Living Expenses: Not on file  Food Insecurity:   . Worried About Charity fundraiser in the Last Year: Not on file  . Ran Out of Food in the Last Year: Not on file  Transportation Needs:   . Lack of Transportation (Medical): Not on file  . Lack of Transportation (Non-Medical): Not on file  Physical Activity:   . Days of Exercise per Week: Not on file  . Minutes of Exercise per Session: Not on file  Stress:   . Feeling of Stress : Not on file  Social Connections:   . Frequency of Communication with Friends and Family: Not on file  . Frequency of Social Gatherings with Friends and Family: Not on file  . Attends Religious Services: Not on file  . Active Member of Clubs or Organizations: Not on file  . Attends Archivist Meetings: Not on file  . Marital Status: Not on file   Family History  Problem Relation Age of Onset  . Allergies Mother   . Heart disease Father        MI at age 1. CABG x4 and AAA repair at age 61.  . Colon cancer Neg Hx    Allergies  Allergen Reactions  . Aleve [Naproxen Sodium] Other (See Comments)    Other reaction(s): Other Severe abdominal pain  . Aspirin Other (See Comments)    Other reaction(s): Other severe abdominal pain and excessive salavation   . Atorvastatin Other (See Comments)    Other reaction(s): Myalgias (Muscle Pain) myalgia  . Clarithromycin Rash, Other (See Comments) and Diarrhea    Says allergic to "mycins"  . Crestor   [Rosuvastatin Calcium]     Other reaction(s): Other  . Durezol [Difluprednate] Other (See Comments)    Other reaction(s): Eye Redness Eye redness    . Olopatadine Hcl     Other reaction(s): Eye Redness  . Ondansetron     Other reaction(s): Other  . Penicillins Swelling, Rash, Other (See Comments) and Itching    Hands swelling Has patient had a PCN reaction causing immediate rash, facial/tongue/throat swelling, SOB or lightheadedness with hypotension: yes Has patient had a PCN reaction causing severe rash involving mucus membranes or skin necrosis: no Has patient had a PCN reaction that required hospitalization no Has patient had a PCN reaction occurring within the last 10 years: no If all  of the above answers are "NO", then may proceed with Cephalosporin use.    . Pitavastatin     Other reaction(s): Arthralgia (Joint Pain)  . Statins Other (See Comments)    Other reaction(s): Other (See Comments) MUSCLE CRAMPS Muscle cramps  . Chlorthalidone Other (See Comments)    Pt reports causes extreme dry mouth and throat  . Crestor [Rosuvastatin] Other (See Comments)    Muscle aches  . Other Other (See Comments)    Paseo eye drops - causes eye redness  . Prednisone     headache  . Adhesive [Tape] Rash and Other (See Comments)    Looks burned  . Zofran [Ondansetron Hcl] Other (See Comments)    Headache    Prior to Admission medications   Medication Sig Start Date End Date Taking? Authorizing Provider  Alirocumab (PRALUENT) 75 MG/ML SOAJ Inject 75 mg into the skin every 14 (fourteen) days. 10/11/19  Yes Dorothy Spark, MD  B Complex Vitamins (VITAMIN B-COMPLEX PO) Take by mouth daily.   Yes [provider]  Biotin w/ Vitamins C & E (HAIR/SKIN/NAILS PO) Take 1 tablet by mouth daily.   Yes [provider]  CALCIUM PO Take by mouth daily.   Yes [provider]  Cholecalciferol (VITAMIN D3) 5000 UNITS TABS Take 1 tablet by mouth every Monday, Wednesday, and  Friday.    Yes [provider]  DULoxetine (CYMBALTA) 20 MG capsule Take 20 mg by mouth daily. 06/19/19  Yes [provider]  Lifitegrast Shirley Friar OP) Apply to eye 2 (two) times daily. For dry eyes   Yes [provider]  potassium chloride SA (KLOR-CON) 20 MEQ tablet Take 1 tablet (20 mEq total) by mouth daily. 10/07/19  Yes Dunn, Dayna N, PA-C  Probiotic Product (PROBIOTIC PO) Take 1-2 capsules by mouth as needed (for GI support).    Yes [provider]  SYNTHROID 25 MCG tablet Take 25 mcg by mouth daily before breakfast.  06/11/16  Yes [provider]  thyroid (ARMOUR) 90 MG tablet Take 60 mg by mouth daily.    Yes [provider]  TURMERIC PO Take 1 capsule by mouth daily.   Yes [provider]  fluticasone (FLONASE) 50 MCG/ACT nasal spray as needed.    [provider]    ROS: All other systems have been reviewed and were otherwise negative with the exception of those mentioned in the HPI and as above.  Physical Exam: General: Alert, no acute distress Cardiovascular: No pedal edema Respiratory: No cyanosis, no use of accessory musculature GI: No organomegaly, abdomen is soft and non-tender Skin: No lesions in the area of chief complaint Neurologic: Sensation intact distally Psychiatric: Patient is competent for consent with normal mood and affect Lymphatic: No axillary or cervical lymphadenopathy  MUSCULOSKELETAL:  Left upper extremity: Splint CDI. Skin intact though cannot assess fully beneath splint. Nontender to palpation proximally. Distal motor and sensory function appear to be intact. Well perfused digits.    Imaging: Left wrist xrays demonstrate displaced distal radius fracture  Assessment: LEFT DISTAL RADIUS FRACTURE  Plan: Plan for Procedure(s): OPEN REDUCTION INTERNAL FIXATION (ORIF) DISTAL RADIAL FRACTURE  The risks benefits and alternatives were discussed with the patient including but not limited  to the risks of nonoperative treatment, versus surgical intervention including infection, bleeding, nerve injury,  blood clots, cardiopulmonary complications, morbidity, mortality, among others, and they were willing to proceed.   The patient acknowledged the explanation, agreed to proceed with the plan and consent  was signed.   She has received cardiac clearance from cardiology team, [facility] PA-C.   Operative Plan: Left wrist ORIF Discharge Medications: Tylenol, Celebrex, Omeprazole, Oxycodone, Zofran DVT Prophylaxis: None Physical Therapy: +/- outpatient OT Special Discharge needs: Splint. Sling for comfort    Ethelda Chick, PA-C  05/07/2020 6:21 AM

## 2020-05-07 NOTE — Anesthesia Procedure Notes (Signed)
Date/Time: 05/07/2020 12:04 PM Performed by: Glory Buff, CRNA Oxygen Delivery Method: Simple face mask

## 2020-05-07 NOTE — Discharge Instructions (Signed)
Post Anesthesia Home Care Instructions  Activity: Get plenty of rest for the remainder of the day. A responsible individual must stay with you for 24 hours following the procedure.  For the next 24 hours, DO NOT: -Drive a car -Operate machinery -Drink alcoholic beverages -Take any medication unless instructed by your physician -Make any legal decisions or sign important papers.  Meals: Start with liquid foods such as gelatin or soup. Progress to regular foods as tolerated. Avoid greasy, spicy, heavy foods. If nausea and/or vomiting occur, drink only clear liquids until the nausea and/or vomiting subsides. Call your physician if vomiting continues.  Special Instructions/Symptoms: Your throat may feel dry or sore from the anesthesia or the breathing tube placed in your throat during surgery. If this causes discomfort, gargle with warm salt water. The discomfort should disappear within 24 hours.  If you had a scopolamine patch placed behind your ear for the management of post- operative nausea and/or vomiting:  1. The medication in the patch is effective for 72 hours, after which it should be removed.  Wrap patch in a tissue and discard in the trash. Wash hands thoroughly with soap and water. 2. You may remove the patch earlier than 72 hours if you experience unpleasant side effects which may include dry mouth, dizziness or visual disturbances. 3. Avoid touching the patch. Wash your hands with soap and water after contact with the patch.      Regional Anesthesia Blocks  1. Numbness or the inability to move the "blocked" extremity may last from 3-48 hours after placement. The length of time depends on the medication injected and your individual response to the medication. If the numbness is not going away after 48 hours, call your surgeon.  2. The extremity that is blocked will need to be protected until the numbness is gone and the  Strength has returned. Because you cannot feel it, you  will need to take extra care to avoid injury. Because it may be weak, you may have difficulty moving it or using it. You may not know what position it is in without looking at it while the block is in effect.  3. For blocks in the legs and feet, returning to weight bearing and walking needs to be done carefully. You will need to wait until the numbness is entirely gone and the strength has returned. You should be able to move your leg and foot normally before you try and bear weight or walk. You will need someone to be with you when you first try to ensure you do not fall and possibly risk injury.  4. Bruising and tenderness at the needle site are common side effects and will resolve in a few days.  5. Persistent numbness or new problems with movement should be communicated to the surgeon or the [facility] ([phone])/ [facility] ([phone]).  Information for Discharge Teaching: EXPAREL (bupivacaine liposome injectable suspension)   Your surgeon or anesthesiologist gave you EXPAREL(bupivacaine) to help control your pain after surgery.   EXPAREL is a local anesthetic that provides pain relief by numbing the tissue around the surgical site.  EXPAREL is designed to release pain medication over time and can control pain for up to 72 hours.  Depending on how you respond to EXPAREL, you may require less pain medication during your recovery.  Possible side effects:  Temporary loss of sensation or ability to move in the area where bupivacaine was injected.  Nausea, vomiting, constipation  Rarely,   numbness and tingling in your mouth or lips, lightheadedness, or anxiety may occur.  Call your doctor right away if you think you may be experiencing any of these sensations, or if you have other questions regarding possible side effects.  Follow all other discharge instructions given to you by your surgeon or nurse. Eat a healthy diet and drink plenty of water or other  fluids.  If you return to the hospital for any reason within 96 hours following the administration of EXPAREL, it is important for health care providers to know that you have received this anesthetic. A teal colored band has been placed on your arm with the date, time and amount of EXPAREL you have received in order to alert and inform your health care providers. Please leave this armband in place for the full 96 hours following administration, and then you may remove the band. 

## 2020-05-07 NOTE — Anesthesia Postprocedure Evaluation (Signed)
Anesthesia Post Note  Patient: Marilyn Rivas  Procedure(s) Performed: OPEN REDUCTION INTERNAL FIXATION (ORIF) DISTAL RADIAL FRACTURE (Left Wrist)     Patient location during evaluation: PACU Anesthesia Type: MAC Level of consciousness: awake and alert Pain management: pain level controlled Vital Signs Assessment: post-procedure vital signs reviewed and stable Respiratory status: spontaneous breathing and respiratory function stable Cardiovascular status: stable Postop Assessment: no apparent nausea or vomiting Anesthetic complications: no   No complications documented.  Last Vitals:  Vitals:   05/07/20 1320 05/07/20 1359  BP:  (!) 149/74  Pulse:  61  Resp:  16  Temp: 36.6 C 36.5 C  SpO2:  98%    Last Pain:  Vitals:   05/07/20 1359  TempSrc:   PainSc: 0-No pain                 Misako Roeder DANIEL

## 2020-05-07 NOTE — Progress Notes (Signed)
Assisted Dr. Singer with left, ultrasound guided, supraclavicular block. Side rails up, monitors on throughout procedure. See vital signs in flow sheet. Tolerated Procedure well. 

## 2020-05-07 NOTE — Anesthesia Procedure Notes (Signed)
Anesthesia Regional Block: Supraclavicular block   Pre-Anesthetic Checklist: ,, timeout performed, Correct Patient, Correct Site, Correct Laterality, Correct Procedure, Correct Position, site marked, Risks and benefits discussed,  Surgical consent,  Pre-op evaluation,  At surgeon's request and post-op pain management  Laterality: Left  Prep: chloraprep       Needles:  Injection technique: Single-shot  Needle Type: Echogenic Stimulator Needle     Needle Length: 5cm  Needle Gauge: 22     Additional Needles:   Narrative:  Start time: 05/07/2020 11:08 AM End time: 05/07/2020 11:18 AM Injection made incrementally with aspirations every 5 mL.  Performed by: Personally  Anesthesiologist: Duane Boston, MD  Additional Notes: Functioning IV was confirmed and monitors applied.  A 40mm 22ga echogenic arrow stimulator was used. Sterile prep and drape,hand hygiene and sterile gloves were used.Ultrasound guidance: relevant anatomy identified, needle position confirmed, local anesthetic spread visualized around nerve(s)., vascular puncture avoided.  Image printed for medical record.  Negative aspiration and negative test dose prior to incremental administration of local anesthetic. The patient tolerated the procedure well.

## 2020-05-07 NOTE — Anesthesia Preprocedure Evaluation (Addendum)
Anesthesia Evaluation  Patient identified by MRN, date of birth, ID band Patient awake    Reviewed: Allergy & Precautions, NPO status , Patient's Chart, lab work & pertinent test results  History of Anesthesia Complications Negative for: history of anesthetic complications  Airway Mallampati: II  TM Distance: >3 FB Neck ROM: Full    Dental no notable dental hx. (+) Dental Advisory Given   Pulmonary neg pulmonary ROS, former smoker,    Pulmonary exam normal        Cardiovascular hypertension, Normal cardiovascular exam     Neuro/Psych PSYCHIATRIC DISORDERS Anxiety Depression negative neurological ROS     GI/Hepatic Neg liver ROS, GERD  ,  Endo/Other  Hypothyroidism   Renal/GU negative Renal ROS     Musculoskeletal negative musculoskeletal ROS (+)   Abdominal   Peds  Hematology negative hematology ROS (+)   Anesthesia Other Findings   Reproductive/Obstetrics                            Anesthesia Physical Anesthesia Plan  ASA: II  Anesthesia Plan: MAC   Post-op Pain Management:  Regional for Post-op pain   Induction: Intravenous  PONV Risk Score and Plan: 2 and Ondansetron and Dexamethasone  Airway Management Planned: Natural Airway  Additional Equipment:   Intra-op Plan:   Post-operative Plan:   Informed Consent: I have reviewed the patients History and Physical, chart, labs and discussed the procedure including the risks, benefits and alternatives for the proposed anesthesia with the patient or authorized representative who has indicated his/her understanding and acceptance.     Dental advisory given  Plan Discussed with: Anesthesiologist and CRNA  Anesthesia Plan Comments:        Anesthesia Quick Evaluation

## 2020-05-07 NOTE — Transfer of Care (Signed)
Immediate Anesthesia Transfer of Care Note  Patient: Marilyn Rivas  Procedure(s) Performed: OPEN REDUCTION INTERNAL FIXATION (ORIF) DISTAL RADIAL FRACTURE (Left Wrist)  Patient Location: PACU  Anesthesia Type:MAC combined with regional for post-op pain  Level of Consciousness: awake, alert  and oriented  Airway & Oxygen Therapy: Patient Spontanous Breathing and Patient connected to face mask oxygen  Post-op Assessment: Report given to RN and Post -op Vital signs reviewed and stable  Post vital signs: Reviewed and stable  Last Vitals:  Vitals Value Taken Time  BP 131/70 05/07/20 1319  Temp    Pulse 70 05/07/20 1321  Resp 14 05/07/20 1321  SpO2 100 % 05/07/20 1321  Vitals shown include unvalidated device data.  Last Pain:  Vitals:   05/07/20 1101  TempSrc: Oral  PainSc: 4       Patients Stated Pain Goal: 5 (27/07/86 7544)  Complications: No complications documented.

## 2020-05-07 NOTE — Interval H&P Note (Signed)
All questions answered patient elected to proceed

## 2020-05-08 ENCOUNTER — Encounter (HOSPITAL_BASED_OUTPATIENT_CLINIC_OR_DEPARTMENT_OTHER): Payer: Self-pay | Admitting: Orthopaedic Surgery

## 2020-05-13 ENCOUNTER — Encounter (HOSPITAL_BASED_OUTPATIENT_CLINIC_OR_DEPARTMENT_OTHER): Payer: Self-pay | Admitting: Emergency Medicine

## 2020-05-13 ENCOUNTER — Other Ambulatory Visit: Payer: Self-pay

## 2020-05-13 ENCOUNTER — Emergency Department (HOSPITAL_BASED_OUTPATIENT_CLINIC_OR_DEPARTMENT_OTHER)
Admission: EM | Admit: 2020-05-13 | Discharge: 2020-05-13 | Disposition: A | Discharge status: Home / Self Care | Payer: Medicare Other | Attending: Emergency Medicine | Admitting: Emergency Medicine

## 2020-05-13 ENCOUNTER — Emergency Department (HOSPITAL_BASED_OUTPATIENT_CLINIC_OR_DEPARTMENT_OTHER): Payer: Medicare Other

## 2020-05-13 DIAGNOSIS — Z96652 Presence of left artificial knee joint: Secondary | ICD-10-CM | POA: Diagnosis not present

## 2020-05-13 DIAGNOSIS — R5383 Other fatigue: Secondary | ICD-10-CM | POA: Insufficient documentation

## 2020-05-13 DIAGNOSIS — R531 Weakness: Secondary | ICD-10-CM | POA: Diagnosis not present

## 2020-05-13 DIAGNOSIS — K5900 Constipation, unspecified: Secondary | ICD-10-CM | POA: Insufficient documentation

## 2020-05-13 DIAGNOSIS — K5792 Diverticulitis of intestine, part unspecified, without perforation or abscess without bleeding: Secondary | ICD-10-CM | POA: Insufficient documentation

## 2020-05-13 DIAGNOSIS — C4490 Unspecified malignant neoplasm of skin, unspecified: Secondary | ICD-10-CM | POA: Diagnosis not present

## 2020-05-13 DIAGNOSIS — R410 Disorientation, unspecified: Secondary | ICD-10-CM | POA: Diagnosis present

## 2020-05-13 DIAGNOSIS — Z87891 Personal history of nicotine dependence: Secondary | ICD-10-CM | POA: Insufficient documentation

## 2020-05-13 DIAGNOSIS — Z79899 Other long term (current) drug therapy: Secondary | ICD-10-CM | POA: Insufficient documentation

## 2020-05-13 DIAGNOSIS — R3981 Functional urinary incontinence: Secondary | ICD-10-CM | POA: Diagnosis not present

## 2020-05-13 DIAGNOSIS — R4182 Altered mental status, unspecified: Secondary | ICD-10-CM

## 2020-05-13 DIAGNOSIS — Z79890 Hormone replacement therapy (postmenopausal): Secondary | ICD-10-CM | POA: Insufficient documentation

## 2020-05-13 DIAGNOSIS — E876 Hypokalemia: Secondary | ICD-10-CM | POA: Diagnosis not present

## 2020-05-13 DIAGNOSIS — Z20822 Contact with and (suspected) exposure to covid-19: Secondary | ICD-10-CM | POA: Diagnosis not present

## 2020-05-13 DIAGNOSIS — I10 Essential (primary) hypertension: Secondary | ICD-10-CM | POA: Diagnosis not present

## 2020-05-13 DIAGNOSIS — E039 Hypothyroidism, unspecified: Secondary | ICD-10-CM | POA: Insufficient documentation

## 2020-05-13 LAB — COMPREHENSIVE METABOLIC PANEL
ALT: 19 U/L (ref 0–44)
AST: 19 U/L (ref 15–41)
Albumin: 3.9 g/dL (ref 3.5–5.0)
Alkaline Phosphatase: 73 U/L (ref 38–126)
Anion gap: 11 (ref 5–15)
BUN: 13 mg/dL (ref 8–23)
CO2: 24 mmol/L (ref 22–32)
Calcium: 9 mg/dL (ref 8.9–10.3)
Chloride: 95 mmol/L — ABNORMAL LOW (ref 98–111)
Creatinine, Ser: 0.61 mg/dL (ref 0.44–1.00)
GFR calc Af Amer: >60 mL/min (ref >60)
GFR calc non Af Amer: >60 mL/min (ref >60)
Glucose, Bld: 126 mg/dL — ABNORMAL HIGH (ref 70–99)
Potassium: 2.6 mmol/L — CL (ref 3.5–5.1)
Sodium: 130 mmol/L — ABNORMAL LOW (ref 135–145)
Total Bilirubin: 1.2 mg/dL (ref 0.3–1.2)
Total Protein: 6.9 g/dL (ref 6.5–8.1)

## 2020-05-13 LAB — CBC WITH DIFFERENTIAL/PLATELET
Abs Immature Granulocytes: 0.05 10*3/uL (ref 0.00–0.07)
Basophils Absolute: 0.1 10*3/uL (ref 0.0–0.1)
Basophils Relative: 1 %
Eosinophils Absolute: 0.1 10*3/uL (ref 0.0–0.5)
Eosinophils Relative: 1 %
HCT: 44.3 % (ref 36.0–46.0)
Hemoglobin: 15.8 g/dL — ABNORMAL HIGH (ref 12.0–15.0)
Immature Granulocytes: 1 %
Lymphocytes Relative: 20 %
Lymphs Abs: 2 10*3/uL (ref 0.7–4.0)
MCH: 32.4 pg (ref 26.0–34.0)
MCHC: 35.7 g/dL (ref 30.0–36.0)
MCV: 91 fL (ref 80.0–100.0)
Monocytes Absolute: 1.1 10*3/uL — ABNORMAL HIGH (ref 0.1–1.0)
Monocytes Relative: 11 %
Neutro Abs: 7 10*3/uL (ref 1.7–7.7)
Neutrophils Relative %: 66 %
Platelets: 410 10*3/uL — ABNORMAL HIGH (ref 150–400)
RBC: 4.87 MIL/uL (ref 3.87–5.11)
RDW: 12.6 % (ref 11.5–15.5)
WBC: 10.3 10*3/uL (ref 4.0–10.5)
nRBC: 0 % (ref 0.0–0.2)

## 2020-05-13 LAB — URINALYSIS, ROUTINE W REFLEX MICROSCOPIC
Bilirubin Urine: NEGATIVE
Glucose, UA: NEGATIVE mg/dL
Ketones, ur: NEGATIVE mg/dL
Leukocytes,Ua: NEGATIVE
Nitrite: NEGATIVE
Protein, ur: NEGATIVE mg/dL
Specific Gravity, Urine: <1.005 — ABNORMAL LOW (ref 1.005–1.030)
pH: 6.5 (ref 5.0–8.0)

## 2020-05-13 LAB — URINALYSIS, MICROSCOPIC (REFLEX)

## 2020-05-13 LAB — MAGNESIUM: Magnesium: 2.2 mg/dL (ref 1.7–2.4)

## 2020-05-13 LAB — LIPASE, BLOOD: Lipase: 26 U/L (ref 11–51)

## 2020-05-13 LAB — LACTIC ACID, PLASMA: Lactic Acid, Venous: 1.1 mmol/L (ref 0.5–1.9)

## 2020-05-13 LAB — TROPONIN I (HIGH SENSITIVITY)
Troponin I (High Sensitivity): 13 ng/L (ref <18)
Troponin I (High Sensitivity): 10 ng/L (ref <18)

## 2020-05-13 LAB — PROTIME-INR
INR: 1 (ref 0.8–1.2)
Prothrombin Time: 12.9 seconds (ref 11.4–15.2)

## 2020-05-13 LAB — SARS CORONAVIRUS 2 BY RT PCR (HOSPITAL ORDER, PERFORMED IN ~~LOC~~ HOSPITAL LAB): SARS Coronavirus 2: NEGATIVE

## 2020-05-13 MED ORDER — POTASSIUM CHLORIDE CRYS ER 20 MEQ PO TBCR
40.0000 meq | EXTENDED_RELEASE_TABLET | Freq: Once | ORAL | Status: AC
  Administered 2020-05-13: 40 meq via ORAL
  Filled 2020-05-13: qty 2

## 2020-05-13 MED ORDER — POTASSIUM CHLORIDE CRYS ER 20 MEQ PO TBCR: EXTENDED_RELEASE_TABLET | ORAL | 0 refills | Status: DC

## 2020-05-13 MED ORDER — POTASSIUM CHLORIDE 10 MEQ/100ML IV SOLN
10.0000 meq | INTRAVENOUS | Status: AC
  Administered 2020-05-13 (×2): 10 meq via INTRAVENOUS
  Filled 2020-05-13 (×2): qty 100

## 2020-05-13 MED ORDER — SODIUM CHLORIDE 0.9 % IV BOLUS
1000.0000 mL | Freq: Once | INTRAVENOUS | Status: AC
  Administered 2020-05-13: 1000 mL via INTRAVENOUS

## 2020-05-13 NOTE — ED Provider Notes (Signed)
Signed out by Dr Melina Copa, that k is low, no other focal/acute process noted, and to d/c to home if/when pending labs result/ok.  UA neg for uti. Delta trop is normal.  Offer fluids/food. Ambulate w assist.   Pt has been taken oxycodone post surgery, including up to 2 at a time - ? Whether possibly related to symptoms.   Rec acetaminophen, minimize opiate use, increased activity, po fluids.   No fever/chills. No cp or sob. No vomiting. abd soft nt. No increased wob. No cellulitis/rashes.   Offered home health, family indicates they are managing ok/staying w pt.   Return precautions provided.      Lajean Saver, MD 05/13/20 1731

## 2020-05-13 NOTE — Discharge Instructions (Addendum)
It was our pleasure to provide your ER care today - we hope that you feel better.  Rest.  Drink plenty of fluids. Eat balanced diet. Consider supplementing nutrition with Boost, Ensure, or other nutritious shake.  Fall precautions.   From today's labs, your potassium level is low (2.6) - eat plenty of fruits and vegetables, take potassium supplement as prescribed, and follow up with primary care doctor in 1 week.   Avoid taking the opiate pain medication. Instead, try acetaminophen as need.   Follow up with primary care doctor in the coming week.  Return to ER if worse, new symptoms, fevers, new or severe pain, chest pain, trouble breathing, abdominal pain, persistent vomiting, or other concern.

## 2020-05-13 NOTE — ED Triage Notes (Signed)
PT here post fall and post surgery with weakness, AMS since then. Fevers and progressively getting worse.

## 2020-05-13 NOTE — ED Notes (Signed)
Assisted pt on bed pan yet voided on bed instead , unable to collect urine.

## 2020-05-13 NOTE — ED Provider Notes (Signed)
[facility] EMERGENCY DEPARTMENT Provider Note   CSN: [id number] Arrival date & time: 05/13/20  1213     History Chief Complaint  Patient presents with  . Fall  . Altered Mental Status  . Weakness    Marilyn Rivas is a 79 y.o. female.  She is here for evaluation of confusion lethargy poor p.o. intake low-grade fevers generalized weakness.  She had a fall on Labor Day and had an operative repair of her left wrist fracture.  Is using some oxycodone for pain.  Mostly using Tylenol.  Daughter is noticed her sleeping more eating and drinking less.  Complaining of a headache for the last 3 days.  Saw PCP for left leg abrasion concern for infection and he put her on doxycycline.  She said 1 tablet of this and it looks better.  Fever up to 101.5.  No cough.  Some nausea no abdominal pain.  No urinary symptoms although has had a couple episodes of incontinence while trying to get to the bathroom.  Has been a little constipated but this is improved with medication.  Had a Covid swab done yesterday with a PCP results unknown but is vaccinated.  The history is provided by the patient and a relative.  Altered Mental Status Presenting symptoms: confusion and lethargy   Severity:  Unable to specify Most recent episode:  More than 2 days ago Episode history:  Multiple Timing:  Intermittent Progression:  Unchanged Chronicity:  New Context: head injury and recent change in medication   Associated symptoms: bladder incontinence, fever, headaches, nausea and weakness   Associated symptoms: no abdominal pain, no difficulty breathing, no seizures, no slurred speech and no visual change   Weakness Associated symptoms: fever, headaches, lethargy and nausea   Associated symptoms: no abdominal pain, no chest pain, no cough, no dysuria, no seizures, no shortness of breath and no vision change        Past Medical History:  Diagnosis Date  . Anxiety   . Arthritis    "left; maybe back"  (01/13/2016)  . Cancer ([place])    skin pre-cancer  . Closed fracture of left distal radius   . Depression   . Diverticulitis   . Diverticulosis   . Family history of adverse reaction to anesthesia    "daughter gets really nauseous & has trouble getting intubated" (01/13/2016)  . GERD (gastroesophageal reflux disease)    HISTORY  . History of blood transfusion 1977   "related to OR"  . Hx of adenomatous colonic polyps   . Hyperlipidemia   . Hypertension   . Hypothyroidism   . Lichen sclerosus 10/5571   Biopsy proven  . Menopause   . Mild aortic insufficiency   . Osteopenia   . Seasonal allergies     Patient Active Problem List   Diagnosis Date Noted  . Statin intolerance 06/19/2018  . Abnormal glucose level 05/04/2017  . Obesity with body mass index 30 or greater 05/04/2017  . Diverticulitis 05/04/2017  . Foot pain 05/04/2017  . Vitamin D deficiency 05/04/2017  . Chest pain 07/07/2016  . Status post left partial knee replacement 01/13/2016  . Ocular rosacea 12/27/2014  . Chemosis of conjunctiva, bilateral 12/08/2014  . Other chronic allergic conjunctivitis 11/28/2014  . 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 Insufficiency of tear film of both eyes 11/17/2014  . Laxity of eyelid 11/17/2014  . Peripheral visual field defect of both eyes 11/17/2014  . Ptosis, myogenic, bilateral 11/17/2014  . Dry eyes 10/30/2014  .  Eyelid retraction left lower eyelid 10/30/2014  . Eyelid retraction right lower eyelid 10/30/2014  . MGD (meibomian gland disease) 10/30/2014  . 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 Thyroid eye disease 10/30/2014  . Keratoconjunctivitis sicca of both eyes not specified as Sjogren's 08/04/2014  . 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 Chronic follicular conjunctivitis of both eyes 08/01/2014  . Abdominal pain, epigastric 07/07/2014  . Fatigue 04/08/2014  . Dyspnea on exertion 04/08/2014  .  Hyperlipidemia 04/08/2014  . Abdominal pain, left lower quadrant 05/20/2013  . Nausea alone 03/06/2013  . LLQ pain 03/05/2013  . Hyperglycemia 10/29/2012  . Urge incontinence of urine 10/29/2012  . Osteoporosis 05/13/2012  . Basal cell cancer 12/17/2011  . Sinus bradycardia by electrocardiogram 11/15/2011  . Hypothyroidism 09/07/2011  . Osteoarthritis of knee 09/07/2011  . Atrophic vaginitis 09/07/2011  . Radiculopathy of lumbar region 09/07/2011  . GERD 09/08/2010  . DIVERTICULITIS, COLON 09/08/2010  . CONSTIPATION 10/09/2009  . PERSONAL HX COLONIC POLYPS 10/09/2009  . Mixed hyperlipidemia 04/10/2009    Past Surgical History:  Procedure Laterality Date  . ACHILLES TENDON SURGERY Right 04/25/2016   Procedure: ACHILLES TENDON REPAIR,PARTIAL EXCISION CALCANEOUS;  Surgeon: [name], MD;  Location: [facility];  Service: Orthopedics;  Laterality: Right;  . APPENDECTOMY  1977  . BACK SURGERY    . BLEPHAROPLASTY Bilateral   . CARDIAC CATHETERIZATION  ~ 2014  . CARPAL TUNNEL RELEASE Right   . CATARACT EXTRACTION W/ INTRAOCULAR LENS  IMPLANT, BILATERAL Bilateral   . CERVICAL FUSION  1999   C5-7  . DILATATION & CURETTAGE/HYSTEROSCOPY WITH TRUECLEAR N/A 05/02/2013   Procedure: DILATATION & CURETTAGE/HYSTEROSCOPY WITH TRUECLEAR ;  Surgeon: Anastasio Auerbach, MD;  Location: [facility];  Service: Gynecology;  Laterality: N/A;  . DILATION AND CURETTAGE OF UTERUS    . HARDWARE REMOVAL Left 12/12/2013   Procedure:  LEFT PATELLA HARDWARE REMOVAL;  Surgeon: [name], MD;  Location: [facility];  Service: Orthopedics;  Laterality: Left;  . HYSTEROSCOPY    . JOINT REPLACEMENT    . KNEE ARTHROSCOPY Left 12/12/2013   Procedure: LEFT PATELLA ARTHROSCOPY KNEE WITH DEBRIDEMENT/SHAVING (CONDROPLASTY), LYSIS OF ADHESIONS;  Surgeon: [name], MD;  Location: [facility];  Service: Orthopedics;  Laterality: Left;  . LEFT HEART CATHETERIZATION WITH  CORONARY ANGIOGRAM N/A 04/22/2014   Procedure: LEFT HEART CATHETERIZATION WITH CORONARY ANGIOGRAM;  Surgeon: Sinclair Grooms, MD;  Location: [facility] CATH LAB;  Service: Cardiovascular;  Laterality: N/A;  . LUMBAR [place] SURGERY  2004   L5  . OPEN REDUCTION INTERNAL FIXATION (ORIF) DISTAL RADIAL FRACTURE Left 05/07/2020   Procedure: OPEN REDUCTION INTERNAL FIXATION (ORIF) DISTAL RADIAL FRACTURE;  Surgeon: Hiram Gash, MD;  Location: [facility];  Service: Orthopedics;  Laterality: Left;  block in preop  . PARTIAL KNEE ARTHROPLASTY Left 01/13/2016   Procedure: LEFT UNICOMPARTMENTAL KNEE;  Surgeon: [name], MD;  Location: [facility];  Service: Orthopedics;  Laterality: Left;  . PATELLA RECONSTRUCTION Left 1994  . REPLACEMENT UNICONDYLAR JOINT KNEE Left 01/13/2016  . RIGHT OOPHORECTOMY  1978  . TONSILLECTOMY       OB History   No obstetric history on file.     Family History  Problem Relation Age of Onset  . Allergies Mother   . Heart disease Father        MI at age 36. CABG x4 and  AAA repair at age 49.  . Colon cancer Neg Hx     Social History   Tobacco Use  . Smoking status: Former Smoker    Packs/day: 1.00    Years: 27.00    Pack years: 27.00    Types: Cigarettes    Quit date: 12/10/1983    Years since quitting: 36.4  . Smokeless tobacco: Never Used  Vaping Use  . Vaping Use: Never used  Substance Use Topics  . Alcohol use: Yes    Comment: 01/13/2016 "I'll have a drink < 2 times/month"  . Drug use: No    Home Medications Prior to Admission medications   Medication Sig Start Date End Date Taking? Authorizing Provider  acetaminophen (TYLENOL) 500 MG tablet Take 2 tablets (1,000 mg total) by mouth every 8 (eight) hours for 14 days. 05/07/20 05/21/20  McBane, Maylene Roes, PA-C  Alirocumab (PRALUENT) 75 MG/ML SOAJ Inject 75 mg into the skin every 14 (fourteen) days. 10/11/19   Dorothy Spark, MD  B Complex Vitamins (VITAMIN B-COMPLEX PO) Take by mouth daily.     [provider]  Biotin w/ Vitamins C & E (HAIR/SKIN/NAILS PO) Take 1 tablet by mouth daily.    [provider]  CALCIUM PO Take by mouth daily.    [provider]  Cholecalciferol (VITAMIN D3) 5000 UNITS TABS Take 1 tablet by mouth every Monday, Wednesday, and Friday.     [provider]  DULoxetine (CYMBALTA) 20 MG capsule Take 20 mg by mouth daily. 06/19/19   [provider]  fluticasone (FLONASE) 50 MCG/ACT nasal spray as needed.    [provider]  Lifitegrast Shirley Friar OP) Apply to eye 2 (two) times daily. For dry eyes    [provider]  potassium chloride SA (KLOR-CON) 20 MEQ tablet Take 1 tablet (20 mEq total) by mouth daily. 10/07/19   Dunn, Nedra Hai, PA-C  Probiotic Product (PROBIOTIC PO) Take 1-2 capsules by mouth as needed (for GI support).     [provider]  SYNTHROID 25 MCG tablet Take 25 mcg by mouth daily before breakfast.  06/11/16   [provider]  thyroid (ARMOUR) 90 MG tablet Take 60 mg by mouth daily.     [provider]  TURMERIC PO Take 1 capsule by mouth daily.    [provider]    Allergies    Aleve [naproxen sodium], Aspirin, Atorvastatin, Clarithromycin, Crestor  [rosuvastatin calcium], Durezol [difluprednate], Olopatadine hcl, Ondansetron, Penicillins, Pitavastatin, Statins, Chlorthalidone, Crestor [rosuvastatin], Other, Prednisone, Adhesive [tape], and Zofran [ondansetron hcl]  Review of Systems   Review of Systems  Constitutional: Positive for activity change, appetite change, fatigue and fever.  HENT: Negative for sore throat.   Eyes: Negative for visual disturbance.  Respiratory: Negative for cough and shortness of breath.   Cardiovascular: Negative for chest pain.  Gastrointestinal: Positive for constipation and nausea. Negative for abdominal pain.  Genitourinary: Positive for bladder incontinence. Negative for dysuria and hematuria.  Musculoskeletal: Negative  for neck pain.  Skin: Positive for wound.  Neurological: Positive for weakness and headaches. Negative for seizures.  Psychiatric/Behavioral: Positive for confusion.    Physical Exam Updated Vital Signs BP (!) 147/67   Pulse 98   Temp 98.3 F (36.8 C)   Resp 18   SpO2 96%   Physical Exam Vitals and nursing note reviewed.  Constitutional:      General: She is not in acute distress.    Appearance: Normal appearance. She is well-developed.  HENT:  Head: Normocephalic and atraumatic.  Eyes:     Conjunctiva/sclera: Conjunctivae normal.  Cardiovascular:     Rate and Rhythm: Normal rate and regular rhythm.     Pulses: Normal pulses.     Heart sounds: No murmur heard.   Pulmonary:     Effort: Pulmonary effort is normal. No respiratory distress.     Breath sounds: Normal breath sounds.  Abdominal:     Palpations: Abdomen is soft.     Tenderness: There is no abdominal tenderness. There is no guarding or rebound.  Musculoskeletal:        General: Signs of injury present.     Cervical back: Neck supple.     Comments: Left arm in cast.  Finger is ecchymotic on that side.  Able to move them easily.  She also has an abrasion to her left lower leg that does not look grossly infected.  Skin:    General: Skin is warm and dry.     Capillary Refill: Capillary refill takes less than 2 seconds.  Neurological:     General: No focal deficit present.     Mental Status: She is alert. She is disoriented.     Comments: She is oriented to place and situation, thought the month was August but otherwise knew it was Wednesday, 2021 and the president was [place]au.  Nonfocal neuro exam.  Somewhat limited to her left arm being in a cast.     ED Results / Procedures / Treatments   Labs (all labs ordered are listed, but only abnormal results are displayed) Labs Reviewed  CBC WITH DIFFERENTIAL/PLATELET - Abnormal; Notable for the following components:      Result Value   Hemoglobin 15.8 (*)     Platelets 410 (*)    Monocytes Absolute 1.1 (*)    All other components within normal limits  COMPREHENSIVE METABOLIC PANEL - Abnormal; Notable for the following components:   Sodium 130 (*)    Potassium 2.6 (*)    Chloride 95 (*)    Glucose, Bld 126 (*)    All other components within normal limits  URINALYSIS, ROUTINE W REFLEX MICROSCOPIC - Abnormal; Notable for the following components:   Specific Gravity, Urine <1.005 (*)    Hgb urine dipstick TRACE (*)    All other components within normal limits  URINALYSIS, MICROSCOPIC (REFLEX) - Abnormal; Notable for the following components:   Bacteria, UA RARE (*)    All other components within normal limits  SARS CORONAVIRUS 2 BY RT PCR (HOSPITAL ORDER, [facility])  URINE CULTURE  PROTIME-INR  LACTIC ACID, PLASMA  LIPASE, BLOOD  MAGNESIUM  TROPONIN I (HIGH SENSITIVITY)  TROPONIN I (HIGH SENSITIVITY)    EKG EKG Interpretation  Date/Time:  Wednesday May 13 2020 13:00:45 EDT Ventricular Rate:  68 PR Interval:    QRS Duration: 93 QT Interval:  423 QTC Calculation: 450 R Axis:   23 Text Interpretation: Sinus rhythm Abnormal R-wave progression, early transition Repol abnrm, severe global ischemia (LM/MVD) No significant change since prior 11/17 Confirmed by Aletta Edouard [phone]) on 05/13/2020 1:06:40 PM   Radiology DG Chest 1 View  Addendum Date: 05/13/2020   ADDENDUM REPORT: 05/13/2020 14:25 ADDENDUM: It should be noted the film was repeated with improved technical factors. The lungs remain clear. No acute abnormality is noted. Electronically Signed   By: Inez Catalina M.D.   On: 05/13/2020 14:25   Result Date: 05/13/2020 CLINICAL DATA:  Recent fall with weakness EXAM: CHEST  1 VIEW COMPARISON:  None. FINDINGS: Cardiac shadows within normal limits. The film is overpenetrated somewhat limiting the lung fields centrally. No focal infiltrate is seen. No acute bony abnormality is noted. Cervical spine  fusion changes are seen. IMPRESSION: Slightly limited exam due to over penetration. No gross abnormality is noted. Electronically Signed: By: Inez Catalina M.D. On: 05/13/2020 14:04   CT Head Wo Contrast  Result Date: 05/13/2020 CLINICAL DATA:  Altered mental status with fever EXAM: CT HEAD WITHOUT CONTRAST TECHNIQUE: Contiguous axial images were obtained from the base of the skull through the vertex without intravenous contrast. COMPARISON:  None. FINDINGS: Brain: Mild diffuse atrophy is stable. There is no intracranial mass, hemorrhage, extra-axial fluid collection, or midline shift. There is slight small vessel disease in the centra semiovale bilaterally. Brain parenchyma elsewhere appears unremarkable. No acute infarct evident. Vascular: No hyperdense vessel.  No evident vascular calcification. Skull: Bony calvarium appears intact. Sinuses/Orbits: Visualized paranasal sinuses are clear. Visualized orbits appear symmetric bilaterally. Other: Mastoid air cells are clear. IMPRESSION: Mild atrophy with slight periventricular small vessel disease. No acute infarct. No mass or hemorrhage. Electronically Signed   By: Lowella Grip III M.D.   On: 05/13/2020 14:04    Procedures Procedures (including critical care time)  Medications Ordered in ED Medications  sodium chloride 0.9 % bolus 1,000 mL (0 mLs Intravenous Stopped 05/13/20 1430)  potassium chloride SA (KLOR-CON) CR tablet 40 mEq (40 mEq Oral Given 05/13/20 1425)  potassium chloride 10 mEq in 100 mL IVPB (0 mEq Intravenous Stopped 05/13/20 1636)    ED Course  I have reviewed the triage vital signs and the nursing notes.  Pertinent labs & imaging results that were available during my care of the patient were reviewed by me and considered in my medical decision making (see chart for details).  Clinical Course as of May 14 1715  Wed May 13, 2020  1409 Chest x-ray does not show any obvious infiltrates.  Head CT interpreted by me as no acute  bleed   [MB]  1417 Potassium coming back critically low at 2.6.  Have ordered her p.o. and IV repletion.   [MB]    Clinical Course User Index [MB] Hayden Rasmussen, MD   MDM Rules/Calculators/A&P                         This patient complains of generalized weakness sleeping more decreased p.o. intake headache low-grade fevers; this involves an extensive number of treatment Options and is a complaint that carries with it a high risk of complications and Morbidity. The differential includes pneumonia, Covid, cellulitis, UTI, dehydration, metabolic derangement  I ordered, reviewed and interpreted labs, which included CBC with normal white count stable hemoglobin, chemistries with low sodium very low potassium low chloride mildly elevated glucose, normal LFTs, troponin not significantly elevated, lactate normal, Covid testing normal I ordered medication IV fluids, IV and oral potassium repletion I ordered imaging studies which included head CT and chest x-ray and I independently    visualized and interpreted imaging which showed no acute changes Additional history obtained from patient's daughter Previous records obtained and reviewed in epic, no recent admissions other than prior arm surgery  After the interventions stated above, I reevaluated the patient and found patient to be good spirits.  She says she feels a little bit better after fluids.  I reviewed results of her testing with her and her daughter.  Still waiting on urinalysis and delta  troponin.  May require admission due to weakness and hypokalemia I have not really identified a further cause of her symptoms.  Signed out to oncoming provider Dr. Ashok Cordia to follow-up on results of testing and assist with disposition.  Final Clinical Impression(s) / ED Diagnoses Final diagnoses:  Generalized weakness  Lethargy  Hypokalemia    Rx / DC Orders ED Discharge Orders    None       Hayden Rasmussen, MD 05/13/20 1721

## 2020-05-14 LAB — URINE CULTURE: Culture: <10000 — AB

## 2020-05-18 NOTE — Progress Notes (Signed)
CARDIOLOGY OFFICE NOTE  Date:  05/20/2020    Marilyn Rivas Date of Birth: 04-10-1941 Medical Record #[id number]  PCP:  Marilyn Blade, MD  Cardiologist:  Marilyn Rivas   Chief Complaint  Patient presents with  . Hospitalization Follow-up    Post ER visit    History of Present Illness: Marilyn Rivas is a 79 y.o. female who presents today for a follow up visit. Seen for Dr. Meda Rivas.   She has a history of mild AI, anxiety, arthritis, depression, diverticulosis, GERD, hypothyroidism, HLD with statin intolerance - on PCSK9 therapy, lichen sclerosis, and former tobacco abuse.   Last seen by a telehealth visit back in February by Marilyn Rivas. Felt to be doing ok. Had had her chlorthalidone increased previously for BP control.   Looks to have needed radial surgery due to fracture earlier this month and was cleared from our office. In the ER last week with generalized weakness/lethargy - potassium of 2.6 noted. On Oxycodone for pain - not eating or drinking well. Was treated with IVF and potassium repletion.    Comes in today. Here with her daughter Marilyn Rivas - she augments the history. She is in a wheelchair. Notes that her mother continues to do poorly. Still confused and lethargic. Has not really "bounced back" as they thought she would. She has actually been off her Chlorthalidone since mid August along with the potassium due to side effects (dry mouth). Remains pretty unsteady. Lives with another daughter. Not short of breath. Appetite slowly returning. She has been back to see her PCP on Monday - had labs - basically ok except for elevated glucose. Potassium was 3.8. Little more alert today. Still exhausted. Daughter notes that a month ago her mother  was totally independent - could walk, take care of herself. Really changed in the last week with headache/lethargy/fatigue. She is not getting worse - and only slowly better. They are both anxious for answers. For MRI on  Saturday. She just had her thyroid medicine changed by PCP this week - now on Synthroid and Armour - per PCP.   Past Medical History:  Diagnosis Date  . Anxiety   . Arthritis    "left; maybe back" (01/13/2016)  . Cancer ([place])    skin pre-cancer  . Closed fracture of left distal radius   . Depression   . Diverticulitis   . Diverticulosis   . Family history of adverse reaction to anesthesia    "daughter gets really nauseous & has trouble getting intubated" (01/13/2016)  . GERD (gastroesophageal reflux disease)    HISTORY  . History of blood transfusion 1977   "related to OR"  . Hx of adenomatous colonic polyps   . Hyperlipidemia   . Hypertension   . Hypothyroidism   . Lichen sclerosus 08/7791   Biopsy proven  . Menopause   . Mild aortic insufficiency   . Osteopenia   . Seasonal allergies     Past Surgical History:  Procedure Laterality Date  . ACHILLES TENDON SURGERY Right 04/25/2016   Procedure: ACHILLES TENDON REPAIR,PARTIAL EXCISION CALCANEOUS;  Surgeon: [name], MD;  Location: [place];  Service: Orthopedics;  Laterality: Right;  . APPENDECTOMY  1977  . BACK SURGERY    . BLEPHAROPLASTY Bilateral   . CARDIAC CATHETERIZATION  ~ 2014  . CARPAL TUNNEL RELEASE Right   . CATARACT EXTRACTION W/ INTRAOCULAR LENS  IMPLANT, BILATERAL Bilateral   . CERVICAL FUSION  1999   C5-7  .  DILATATION & CURETTAGE/HYSTEROSCOPY WITH TRUECLEAR N/A 05/02/2013   Procedure: DILATATION & CURETTAGE/HYSTEROSCOPY WITH TRUECLEAR ;  Surgeon: Anastasio Auerbach, MD;  Location: [facility];  Service: Gynecology;  Laterality: N/A;  . DILATION AND CURETTAGE OF UTERUS    . HARDWARE REMOVAL Left 12/12/2013   Procedure:  LEFT PATELLA HARDWARE REMOVAL;  Surgeon: [name], MD;  Location: [place];  Service: Orthopedics;  Laterality: Left;  . HYSTEROSCOPY    . JOINT REPLACEMENT    . KNEE ARTHROSCOPY Left 12/12/2013   Procedure: LEFT PATELLA ARTHROSCOPY KNEE WITH  DEBRIDEMENT/SHAVING (CONDROPLASTY), LYSIS OF ADHESIONS;  Surgeon: [name], MD;  Location: [facility];  Service: Orthopedics;  Laterality: Left;  . LEFT HEART CATHETERIZATION WITH CORONARY ANGIOGRAM N/A 04/22/2014   Procedure: LEFT HEART CATHETERIZATION WITH CORONARY ANGIOGRAM;  Surgeon: Sinclair Grooms, MD;  Location: [facility];  Service: Cardiovascular;  Laterality: N/A;  . LUMBAR [place] SURGERY  2004   L5  . OPEN REDUCTION INTERNAL FIXATION (ORIF) DISTAL RADIAL FRACTURE Left 05/07/2020   Procedure: OPEN REDUCTION INTERNAL FIXATION (ORIF) DISTAL RADIAL FRACTURE;  Surgeon: Hiram Gash, MD;  Location: [place];  Service: Orthopedics;  Laterality: Left;  block in preop  . PARTIAL KNEE ARTHROPLASTY Left 01/13/2016   Procedure: LEFT UNICOMPARTMENTAL KNEE;  Surgeon: [name], MD;  Location: [facility];  Service: Orthopedics;  Laterality: Left;  . PATELLA RECONSTRUCTION Left 1994  . REPLACEMENT UNICONDYLAR JOINT KNEE Left 01/13/2016  . RIGHT OOPHORECTOMY  1978  . TONSILLECTOMY       Medications: Current Meds  Medication Sig  . acetaminophen (TYLENOL) 500 MG tablet Take 2 tablets (1,000 mg total) by mouth every 8 (eight) hours for 14 days.  . Alirocumab (PRALUENT) 75 MG/ML SOAJ Inject 75 mg into the skin every 14 (fourteen) days.  . B Complex Vitamins (VITAMIN B-COMPLEX PO) Take by mouth daily.  . Biotin w/ Vitamins C & E (HAIR/SKIN/NAILS PO) Take 1 tablet by mouth daily.  Marland Kitchen CALCIUM PO Take by mouth daily.  . Cholecalciferol (VITAMIN D3) 5000 UNITS TABS Take 1 tablet by mouth every Monday, Wednesday, and Friday.   . DULoxetine (CYMBALTA) 20 MG capsule Take 20 mg by mouth daily.  Lowanda Foster (XIIDRA OP) Apply to eye 2 (two) times daily. For dry eyes  . potassium chloride (KLOR-CON) 10 MEQ tablet Take 20 mEq by mouth daily.  . Probiotic Product (PROBIOTIC PO) Take 1-2 capsules by mouth as needed (for GI support).   . SYNTHROID 25 MCG tablet Take 25 mcg  by mouth daily before breakfast.   . thyroid (ARMOUR) 90 MG tablet Take 60 mg by mouth daily.   . TURMERIC PO Take 1 capsule by mouth daily.   Current Facility-Administered Medications for the 05/20/20 encounter (Office Visit) with Burtis Junes, NP  Medication  . 0.9 %  sodium chloride infusion     Allergies: Allergies  Allergen Reactions  . Aleve [Naproxen Sodium] Other (See Comments)    Other reaction(s): Other Severe abdominal pain  . Aspirin Other (See Comments)    Other reaction(s): Other severe abdominal pain and excessive salavation   . Atorvastatin Other (See Comments)    Other reaction(s): Myalgias (Muscle Pain) myalgia  . Clarithromycin Rash, Other (See Comments) and Diarrhea    Says allergic to "mycins"  . Crestor  [Rosuvastatin Calcium]     Other reaction(s): Other  . Durezol [Difluprednate] Other (See Comments)    Other reaction(s): Eye Redness  Eye redness    . Olopatadine Hcl     Other reaction(s): Eye Redness  . Ondansetron     Other reaction(s): Other, headache  . Penicillins Swelling, Rash, Other (See Comments) and Itching    Hands swelling Has patient had a PCN reaction causing immediate rash, facial/tongue/throat swelling, SOB or lightheadedness with hypotension: yes Has patient had a PCN reaction causing severe rash involving mucus membranes or skin necrosis: no Has patient had a PCN reaction that required hospitalization no Has patient had a PCN reaction occurring within the last 10 years: no If all of the above answers are "NO", then may proceed with Cephalosporin use.    . Pitavastatin     Other reaction(s): Arthralgia (Joint Pain)  . Statins Other (See Comments)    Other reaction(s): Other (See Comments) MUSCLE CRAMPS Muscle cramps  . Chlorthalidone Other (See Comments)    Pt reports causes extreme dry mouth and throat  . Crestor [Rosuvastatin] Other (See Comments)    Muscle aches  . Other Other (See Comments)    Paseo eye drops -  causes eye redness  . Prednisone     headache  . Adhesive [Tape] Rash and Other (See Comments)    Looks burned  . Zofran [Ondansetron Hcl] Other (See Comments)    Headache     Social History: The patient  reports that she quit smoking about 36 years ago. Her smoking use included cigarettes. She has a 27.00 pack-year smoking history. She has never used smokeless tobacco. She reports current alcohol use. She reports that she does not use drugs.   Family History: The patient's family history includes Allergies in her mother; Heart disease in her father.   Review of Systems: Please see the history of present illness.   All other systems are reviewed and negative.   Physical Exam: VS:  BP 120/90   Pulse 87   Ht 5' (1.524 m)   Wt 148 lb 9.6 oz (67.4 kg)   SpO2 94%   BMI 29.02 kg/m  .  BMI Body mass index is 29.02 kg/m.  Wt Readings from Last 3 Encounters:  05/20/20 148 lb 9.6 oz (67.4 kg)  05/07/20 157 lb 10.1 oz (71.5 kg)  10/07/19 153 lb (69.4 kg)    General: Alert and in no acute distress.  She looks chronically ill. Her weight is down 9 pounds. Color looks a little sallow. She is alert. She answers questions appropriately.  HEENT: Pupils equal.  Cardiac: Regular rate and rhythm. No murmurs, rubs, or gallops. No significant edema.  Respiratory:  Lungs are clear to auscultation bilaterally with normal work of breathing.  GI: Soft and nontender.  MS: No deformity or atrophy. Gait not tested.  Skin: Warm and dry. Color is sallow.  Neuro:  Strength and sensation are intact and no gross focal deficits noted.  Psych: Alert, appropriate and with normal affect.   LABORATORY DATA:  EKG:  EKG is not ordered today.    Lab Results  Component Value Date   WBC 10.3 05/13/2020   HGB 15.8 (H) 05/13/2020   HCT 44.3 05/13/2020   PLT 410 (H) 05/13/2020   GLUCOSE 126 (H) 05/13/2020   CHOL 145 08/21/2019   TRIG 174 (H) 08/21/2019   HDL 54 08/21/2019   LDLDIRECT 206.1 08/01/2007    LDLCALC 62 08/21/2019   ALT 19 05/13/2020   AST 19 05/13/2020   NA 130 (L) 05/13/2020   K 2.6 (LL) 05/13/2020   CL 95 (L) 05/13/2020  CREATININE 0.61 05/13/2020   BUN 13 05/13/2020   CO2 24 05/13/2020   TSH 0.517 08/21/2019   INR 1.0 05/13/2020   HGBA1C 5.8 (H) 11/05/2012     BNP (last 3 results) No results for input(s): BNP in the last 8760 hours.  ProBNP (last 3 results) No results for input(s): PROBNP in the last 8760 hours.   Other Studies Reviewed Today:  2D echo 07/18/19 1. Left ventricular ejection fraction, by visual estimation, is 60 to  65%. The left ventricle has normal function. Normal left ventricular size.  There is mildly increased left ventricular hypertrophy.  2. Left ventricular diastolic Doppler parameters are indeterminate  pattern of LV diastolic filling.  3. Global right ventricle has normal systolic function.The right  ventricular size is normal. No increase in right ventricular wall  thickness.  4. Left atrial size was normal.  5. Right atrial size was normal.  6. The mitral valve is normal in structure. No evidence of mitral valve  regurgitation.  7. The tricuspid valve is normal in structure. Tricuspid valve  regurgitation was not visualized by color flow Doppler.  8. The aortic valve is tricuspid Aortic valve regurgitation is mild by  color flow Doppler.  9. The pulmonic valve was not well visualized. Pulmonic valve  regurgitation is not visualized by color flow Doppler.  10. The inferior vena cava is normal in size with greater than 50%  respiratory variability, suggesting right atrial pressure of 3 mmHg.        ASSESSMENT & PLAN:    1. Hypokalemia - unclear etiology - had already been off her diuretic therapy - may need to consider renin/aldosterone/cortisol/adrenal work up. Will repeat another BMET today. She is currently on 20 meq total of replacement therapy.   2. Ongoing headache/lethargy - agree with MRI - this is for  later this week.   3. HTN - BP fair - would just follow for now. She remains off Chlorthalidone.   4. HLD - on PCSK9 therapy - not discussed.   5. Recent falls - recent left radial fracture.   6. Normal coronaries per remote cath - no active chest pain.   Current medicines are reviewed with the patient today.  The patient does not have concerns regarding medicines other than what has been noted above.  The following changes have been made:  See above.  Labs/ tests ordered today include:    Orders Placed This Encounter  Procedures  . Basic metabolic panel     Disposition:   FU with [place] in 2 to 3 months. I do not think her current presentation is related to cardiac etiology. We will recheck a BMET again today to ensure that her potassium is ok. Agree with MRI scanning later this week.    Patient is agreeable to this plan and will call if any problems develop in the interim.   SignedTruitt Merle, NP  05/20/2020 9:15 AM  [facility] [address] [place], [place]  [address] Phone: [phone] Fax: [phone]

## 2020-05-19 ENCOUNTER — Other Ambulatory Visit (HOSPITAL_BASED_OUTPATIENT_CLINIC_OR_DEPARTMENT_OTHER): Payer: Self-pay | Admitting: Internal Medicine

## 2020-05-19 DIAGNOSIS — F0390 Unspecified dementia without behavioral disturbance: Secondary | ICD-10-CM

## 2020-05-20 ENCOUNTER — Ambulatory Visit (INDEPENDENT_AMBULATORY_CARE_PROVIDER_SITE_OTHER): Payer: Medicare Other | Admitting: Nurse Practitioner

## 2020-05-20 ENCOUNTER — Other Ambulatory Visit: Payer: Self-pay

## 2020-05-20 ENCOUNTER — Encounter: Payer: Self-pay | Admitting: Nurse Practitioner

## 2020-05-20 VITALS — BP 120/90 | HR 87 | Ht 60.0 in | Wt 148.6 lb

## 2020-05-20 DIAGNOSIS — I10 Essential (primary) hypertension: Secondary | ICD-10-CM

## 2020-05-20 DIAGNOSIS — E876 Hypokalemia: Secondary | ICD-10-CM

## 2020-05-20 DIAGNOSIS — Z79899 Other long term (current) drug therapy: Secondary | ICD-10-CM

## 2020-05-20 DIAGNOSIS — E782 Mixed hyperlipidemia: Secondary | ICD-10-CM | POA: Diagnosis not present

## 2020-05-20 DIAGNOSIS — E785 Hyperlipidemia, unspecified: Secondary | ICD-10-CM

## 2020-05-20 LAB — BASIC METABOLIC PANEL
BUN/Creatinine Ratio: 19 (ref 12–28)
BUN: 11 mg/dL (ref 8–27)
CO2: 24 mmol/L (ref 20–29)
Calcium: 10.3 mg/dL (ref 8.7–10.3)
Chloride: 100 mmol/L (ref 96–106)
Creatinine, Ser: 0.58 mg/dL (ref 0.57–1.00)
GFR calc Af Amer: 101 mL/min/{1.73_m2} (ref >59)
GFR calc non Af Amer: 87 mL/min/{1.73_m2} (ref >59)
Glucose: 131 mg/dL — ABNORMAL HIGH (ref 65–99)
Potassium: 3.7 mmol/L (ref 3.5–5.2)
Sodium: 133 mmol/L — ABNORMAL LOW (ref 134–144)

## 2020-05-20 NOTE — Patient Instructions (Addendum)
After Visit Summary:  We will be checking the following labs today - STAT BMET   Medication Instructions:    Continue with your current medicines.    If you need a refill on your cardiac medications before your next appointment, please call your pharmacy.     Testing/Procedures To Be Arranged:  N/A  Follow-Up:   See Dr. Meda Coffee in about 2 to 3 months.     At [facility], you and your health needs are our priority.  As part of our continuing mission to provide you with exceptional heart care, we have created designated Provider Care Teams.  These Care Teams include your primary Cardiologist (physician) and Advanced Practice Providers (APPs -  Physician Assistants and Nurse Practitioners) who all work together to provide you with the care you need, when you need it.  Special Instructions:  . Stay safe, wash your hands for at least 20 seconds and wear a mask when needed.  . It was good to talk with you both today.  . I would proceed with the MRI scan as planned . May need to consider work up for adrenal/aldosterone/renin/cortisol issues.    Call the [place] office at [phone] if you have any questions, problems or concerns.

## 2020-05-23 ENCOUNTER — Other Ambulatory Visit: Payer: Self-pay

## 2020-05-23 ENCOUNTER — Ambulatory Visit (HOSPITAL_BASED_OUTPATIENT_CLINIC_OR_DEPARTMENT_OTHER)
Admission: RE | Admit: 2020-05-23 | Discharge: 2020-05-23 | Disposition: A | Discharge status: Home / Self Care | Payer: Medicare Other | Source: Ambulatory Visit | Attending: Internal Medicine | Admitting: Internal Medicine

## 2020-05-23 DIAGNOSIS — F0390 Unspecified dementia without behavioral disturbance: Secondary | ICD-10-CM | POA: Diagnosis present

## 2020-05-23 MED ORDER — GADOBUTROL 1 MMOL/ML IV SOLN
6.8000 mL | Freq: Once | INTRAVENOUS | Status: AC | PRN
  Administered 2020-05-23: 6.8 mL via INTRAVENOUS

## 2020-06-29 ENCOUNTER — Ambulatory Visit (INDEPENDENT_AMBULATORY_CARE_PROVIDER_SITE_OTHER): Payer: Medicare Other | Admitting: Cardiology

## 2020-06-29 ENCOUNTER — Encounter: Payer: Self-pay | Admitting: Cardiology

## 2020-06-29 ENCOUNTER — Other Ambulatory Visit: Payer: Self-pay

## 2020-06-29 VITALS — BP 122/76 | HR 86 | Ht 60.0 in | Wt 149.6 lb

## 2020-06-29 DIAGNOSIS — E876 Hypokalemia: Secondary | ICD-10-CM

## 2020-06-29 LAB — BASIC METABOLIC PANEL
BUN/Creatinine Ratio: 16 (ref 12–28)
BUN: 10 mg/dL (ref 8–27)
CO2: 24 mmol/L (ref 20–29)
Calcium: 10.3 mg/dL (ref 8.7–10.3)
Chloride: 102 mmol/L (ref 96–106)
Creatinine, Ser: 0.62 mg/dL (ref 0.57–1.00)
GFR calc Af Amer: 98 mL/min/{1.73_m2} (ref >59)
GFR calc non Af Amer: 85 mL/min/{1.73_m2} (ref >59)
Glucose: 95 mg/dL (ref 65–99)
Potassium: 4.5 mmol/L (ref 3.5–5.2)
Sodium: 140 mmol/L (ref 134–144)

## 2020-06-29 NOTE — Progress Notes (Signed)
CARDIOLOGY OFFICE NOTE  Date:  06/29/2020    Marilyn Rivas Date of Birth: 06/26/1941 Medical Record #[id number]  PCP:  Willey Blade, MD  Cardiologist:  Meda Coffee    Reason for visit; follow-up  History of Present Illness: Marilyn Rivas is a 79 y.o. female with history of mild AI, anxiety, arthritis, depression, diverticulosis, GERD, hypothyroidism, HLD with statin intolerance - on PCSK9 therapy, lichen sclerosis, and former tobacco abuse.   She has been doing great until last months when she tripped on stairs and fell down broke her left wrist and severely bruised her left shin, she underwent surgery of her wrist and is slowly recovering, now being able to walk up to half a mile every day.  She has been compliant with her medications, she denies any chest pain or shortness of breath, she is just profoundly tired from all the recent events and surgery.  She denies any palpitations, no presyncope or syncope.  She is positive that at the time of fall she missed the step.  No lower extremity edema orthopnea or paroxymal nocturnal dyspnea.  While she was in the hospital her diuretic and potassium were both discontinued and she developed severe hypokalemia that has resolved, most recent potassium on 05/15/2020 was 3.7.  Past Medical History:  Diagnosis Date  . Anxiety   . Arthritis    "left; maybe back" (01/13/2016)  . Cancer ([place])    skin pre-cancer  . Closed fracture of left distal radius   . Depression   . Diverticulitis   . Diverticulosis   . Family history of adverse reaction to anesthesia    "daughter gets really nauseous & has trouble getting intubated" (01/13/2016)  . GERD (gastroesophageal reflux disease)    HISTORY  . History of blood transfusion 1977   "related to OR"  . Hx of adenomatous colonic polyps   . Hyperlipidemia   . Hypertension   . Hypothyroidism   . Lichen sclerosus 02/1695   Biopsy proven  . Menopause   . Mild aortic insufficiency   .  Osteopenia   . Seasonal allergies     Past Surgical History:  Procedure Laterality Date  . ACHILLES TENDON SURGERY Right 04/25/2016   Procedure: ACHILLES TENDON REPAIR,PARTIAL EXCISION CALCANEOUS;  Surgeon: [name], MD;  Location: [facility];  Service: Orthopedics;  Laterality: Right;  . APPENDECTOMY  1977  . BACK SURGERY    . BLEPHAROPLASTY Bilateral   . CARDIAC CATHETERIZATION  ~ 2014  . CARPAL TUNNEL RELEASE Right   . CATARACT EXTRACTION W/ INTRAOCULAR LENS  IMPLANT, BILATERAL Bilateral   . CERVICAL FUSION  1999   C5-7  . DILATATION & CURETTAGE/HYSTEROSCOPY WITH TRUECLEAR N/A 05/02/2013   Procedure: DILATATION & CURETTAGE/HYSTEROSCOPY WITH TRUECLEAR ;  Surgeon: Anastasio Auerbach, MD;  Location: [facility];  Service: Gynecology;  Laterality: N/A;  . DILATION AND CURETTAGE OF UTERUS    . HARDWARE REMOVAL Left 12/12/2013   Procedure:  LEFT PATELLA HARDWARE REMOVAL;  Surgeon: [name], MD;  Location: [facility];  Service: Orthopedics;  Laterality: Left;  . HYSTEROSCOPY    . JOINT REPLACEMENT    . KNEE ARTHROSCOPY Left 12/12/2013   Procedure: LEFT PATELLA ARTHROSCOPY KNEE WITH DEBRIDEMENT/SHAVING (CONDROPLASTY), LYSIS OF ADHESIONS;  Surgeon: [name], MD;  Location: [facility];  Service: Orthopedics;  Laterality: Left;  . LEFT HEART CATHETERIZATION WITH CORONARY ANGIOGRAM N/A 04/22/2014   Procedure: LEFT HEART CATHETERIZATION WITH CORONARY ANGIOGRAM;  Surgeon: Sinclair Grooms, MD;  Location: [facility] CATH LAB;  Service: Cardiovascular;  Laterality: N/A;  . LUMBAR [place] SURGERY  2004   L5  . OPEN REDUCTION INTERNAL FIXATION (ORIF) DISTAL RADIAL FRACTURE Left 05/07/2020   Procedure: OPEN REDUCTION INTERNAL FIXATION (ORIF) DISTAL RADIAL FRACTURE;  Surgeon: Hiram Gash, MD;  Location: [facility];  Service: Orthopedics;  Laterality: Left;  block in preop  . PARTIAL KNEE ARTHROPLASTY Left 01/13/2016   Procedure: LEFT  UNICOMPARTMENTAL KNEE;  Surgeon: [name], MD;  Location: [facility];  Service: Orthopedics;  Laterality: Left;  . PATELLA RECONSTRUCTION Left 1994  . REPLACEMENT UNICONDYLAR JOINT KNEE Left 01/13/2016  . RIGHT OOPHORECTOMY  1978  . TONSILLECTOMY       Medications: Current Meds  Medication Sig  . Alirocumab (PRALUENT) 75 MG/ML SOAJ Inject 75 mg into the skin every 14 (fourteen) days.  . Cholecalciferol (VITAMIN D3) 5000 UNITS TABS Take 1 tablet by mouth every Monday, Wednesday, and Friday.   . DULoxetine (CYMBALTA) 20 MG capsule Take 20 mg by mouth daily.  Lowanda Foster (XIIDRA OP) Apply to eye 2 (two) times daily. For dry eyes  . potassium chloride (KLOR-CON) 10 MEQ tablet Take 20 mEq by mouth daily.  . Probiotic Product (PROBIOTIC PO) Take 1-2 capsules by mouth as needed (for GI support).   . SYNTHROID 25 MCG tablet Take 25 mcg by mouth daily before breakfast.   . thyroid (ARMOUR) 90 MG tablet Take 90 mg by mouth daily.    Current Facility-Administered Medications for the 06/29/20 encounter (Office Visit) with Dorothy Spark, MD  Medication  . 0.9 %  sodium chloride infusion     Allergies: Allergies  Allergen Reactions  . Aleve [Naproxen Sodium] Other (See Comments)    Other reaction(s): Other Severe abdominal pain  . Aspirin Other (See Comments)    Other reaction(s): Other severe abdominal pain and excessive salavation   . Atorvastatin Other (See Comments)    Other reaction(s): Myalgias (Muscle Pain) myalgia  . Clarithromycin Rash, Other (See Comments) and Diarrhea    Says allergic to "mycins"  . Crestor  [Rosuvastatin Calcium]     Other reaction(s): Other  . Durezol [Difluprednate] Other (See Comments)    Other reaction(s): Eye Redness Eye redness    . Olopatadine Hcl     Other reaction(s): Eye Redness  . Ondansetron     Other reaction(s): Other, headache  . Penicillins Swelling, Rash, Other (See Comments) and Itching    Hands swelling Has patient had a  PCN reaction causing immediate rash, facial/tongue/throat swelling, SOB or lightheadedness with hypotension: yes Has patient had a PCN reaction causing severe rash involving mucus membranes or skin necrosis: no Has patient had a PCN reaction that required hospitalization no Has patient had a PCN reaction occurring within the last 10 years: no If all of the above answers are "NO", then may proceed with Cephalosporin use.    . Pitavastatin     Other reaction(s): Arthralgia (Joint Pain)  . Statins Other (See Comments)    Other reaction(s): Other (See Comments) MUSCLE CRAMPS Muscle cramps  . Chlorthalidone Other (See Comments)    Pt reports causes extreme dry mouth and throat  . Crestor [Rosuvastatin] Other (See Comments)    Muscle aches  . Other Other (See Comments)    Paseo eye drops - causes eye redness  . Prednisone     headache  . Adhesive [Tape] Rash and Other (See Comments)  Looks burned  . Zofran [Ondansetron Hcl] Other (See Comments)    Headache     Social History: The patient  reports that she quit smoking about 36 years ago. Her smoking use included cigarettes. She has a 27.00 pack-year smoking history. She has never used smokeless tobacco. She reports current alcohol use. She reports that she does not use drugs.   Family History: The patient's family history includes Allergies in her mother; Heart disease in her father.   Review of Systems: Please see the history of present illness.   All other systems are reviewed and negative.   Physical Exam: VS:  BP 122/76   Pulse 86   Ht 5' (1.524 m)   Wt 149 lb 9.6 oz (67.9 kg)   SpO2 96%   BMI 29.22 kg/m  .  BMI Body mass index is 29.22 kg/m.  Wt Readings from Last 3 Encounters:  06/29/20 149 lb 9.6 oz (67.9 kg)  05/20/20 148 lb 9.6 oz (67.4 kg)  05/07/20 157 lb 10.1 oz (71.5 kg)    General: Alert and in no acute distress.  She looks chronically ill. Her weight is down 9 pounds. Color looks a little sallow. She is  alert. She answers questions appropriately.  HEENT: Pupils equal.  Cardiac: Regular rate and rhythm. No murmurs, rubs, or gallops. No significant edema.  Respiratory:  Lungs are clear to auscultation bilaterally with normal work of breathing.  GI: Soft and nontender.  MS: No deformity or atrophy. Gait not tested.  Skin: Warm and dry. Color is sallow.  Neuro:  Strength and sensation are intact and no gross focal deficits noted.  Psych: Alert, appropriate and with normal affect.   LABORATORY DATA:  EKG:  EKG performed today June 29, 2020 shows normal sinus rhythm with negative T waves in the anterolateral inferior leads this is unchanged from prior.  Lab Results  Component Value Date   WBC 10.3 05/13/2020   HGB 15.8 (H) 05/13/2020   HCT 44.3 05/13/2020   PLT 410 (H) 05/13/2020   GLUCOSE 131 (H) 05/20/2020   CHOL 145 08/21/2019   TRIG 174 (H) 08/21/2019   HDL 54 08/21/2019   LDLDIRECT 206.1 08/01/2007   LDLCALC 62 08/21/2019   ALT 19 05/13/2020   AST 19 05/13/2020   NA 133 (L) 05/20/2020   K 3.7 05/20/2020   CL 100 05/20/2020   CREATININE 0.58 05/20/2020   BUN 11 05/20/2020   CO2 24 05/20/2020   TSH 0.517 08/21/2019   INR 1.0 05/13/2020   HGBA1C 5.8 (H) 11/05/2012     BNP (last 3 results) No results for input(s): BNP in the last 8760 hours.  ProBNP (last 3 results) No results for input(s): PROBNP in the last 8760 hours.   Other Studies Reviewed Today:  2D echo 07/05/19 1. Left ventricular ejection fraction, by visual estimation, is 60 to  65%. The left ventricle has normal function. Normal left ventricular size.  There is mildly increased left ventricular hypertrophy.  2. Left ventricular diastolic Doppler parameters are indeterminate  pattern of LV diastolic filling.  3. Global right ventricle has normal systolic function.The right  ventricular size is normal. No increase in right ventricular wall  thickness.  4. Left atrial size was normal.  5. Right  atrial size was normal.  6. The mitral valve is normal in structure. No evidence of mitral valve  regurgitation.  7. The tricuspid valve is normal in structure. Tricuspid valve  regurgitation was not visualized by color flow  Doppler.  8. The aortic valve is tricuspid Aortic valve regurgitation is mild by  color flow Doppler.  9. The pulmonic valve was not well visualized. Pulmonic valve  regurgitation is not visualized by color flow Doppler.  10. The inferior vena cava is normal in size with greater than 50%  respiratory variability, suggesting right atrial pressure of 3 mmHg.      ASSESSMENT & PLAN:    1. Hypokalemia - unclear etiology -I will recheck BMP today, she is advised to continue taking 20 mEq of potassium daily, her blood pressure is 120 mmHg, in the settings of recent trauma I am reluctant to start her back on chlorthalidone, but might reconsider in the future when she recovers from this event.  2. HTN -controlled on no medication.  3. HLD - on PCSK9 therapy -she is tolerating it well.  4. Recent falls -mechanical fall, CT and MRI of the head after her fall showed only changes consistent with aging but no stroke or other abnormalities.  5. Normal coronaries per remote cath - no active chest pain.   Current medicines are reviewed with the patient today.  The patient does not have concerns regarding medicines other than what has been noted above.  The following changes have been made:  See above.  Labs/ tests ordered today include:    Orders Placed This Encounter  Procedures  . Basic metabolic panel  . EKG 12-Lead     Disposition:   FU with [place] in 2 to 3 months. I do not think her current presentation is related to cardiac etiology. We will recheck a BMET again today to ensure that her potassium is ok. Agree with MRI scanning later this week.    Patient is agreeable to this plan and will call if any problems develop in the interim.   Signed: Ena Dawley, MD  06/29/2020 1:09 PM  [facility] [address] [place], [place]  [address] Phone: [phone] Fax: [phone]

## 2020-06-29 NOTE — Patient Instructions (Signed)
Medication Instructions:   Your physician recommends that you continue on your current medications as directed. Please refer to the Current Medication list given to you today.  *If you need a refill on your cardiac medications before your next appointment, please call your pharmacy*  Lab Work:  TODAY--BMET  If you have labs (blood work) drawn today and your tests are completely normal, you will receive your results only by: Marland Kitchen MyChart Message (if you have MyChart) OR . A paper copy in the mail If you have any lab test that is abnormal or we need to change your treatment, we will call you to review the results.  Follow-Up: At [facility], you and your health needs are our priority.  As part of our continuing mission to provide you with exceptional heart care, we have created designated Provider Care Teams.  These Care Teams include your primary Cardiologist (physician) and Advanced Practice Providers (APPs -  Physician Assistants and Nurse Practitioners) who all work together to provide you with the care you need, when you need it.  We recommend signing up for the patient portal called "MyChart".  Sign up information is provided on this After Visit Summary.  MyChart is used to connect with patients for Virtual Visits (Telemedicine).  Patients are able to view lab/test results, encounter notes, upcoming appointments, etc.  Non-urgent messages can be sent to your provider as well.   To learn more about what you can do with MyChart, go to [email or web address].    Your next appointment:   6 month(s)  The format for your next appointment:   In Person  Provider:   Ena Dawley, MD

## 2020-08-19 ENCOUNTER — Telehealth: Payer: Self-pay | Admitting: Cardiology

## 2020-08-19 MED ORDER — PRALUENT 75 MG/ML ~~LOC~~ SOAJ: 75.0000 mg | SUBCUTANEOUS | 3 refills | Status: DC

## 2020-08-19 NOTE — Telephone Encounter (Signed)
°*  STAT* If patient is at the pharmacy, call can be transferred to refill team.   1. Which medications need to be refilled? (please list name of each medication and dose if known) Alirocumab (PRALUENT) 75 MG/ML SOAJ  2. Which pharmacy/location (including street and city if local pharmacy) is medication to be sent to? [facility] [place] [place] AT [address]  3. Do they need a 30 day or 90 day supply? 90 day supply

## 2020-09-04 ENCOUNTER — Telehealth: Payer: Self-pay | Admitting: Pharmacist

## 2020-09-04 NOTE — Telephone Encounter (Signed)
Patient called as her healthwell grant was going to expire  [employer] renewed. New info called to pharmacy Pharmacy Card Id [id number] Group [id number] PCN [id number] BIN [place]  Insurance info [place] [id number] BIN [id number] PCN [phone]

## 2020-09-17 ENCOUNTER — Telehealth: Payer: Self-pay | Admitting: Cardiology

## 2020-09-17 NOTE — Telephone Encounter (Signed)
Pt c/o medication issue:  1. Name of Medication: Alirocumab ([place]) 75 MG/ML SOAJ  2. How are you currently taking this medication (dosage and times per day)? 1 injection every 14 days  3. Are you having a reaction (difficulty breathing--STAT)? no  4. What is your medication issue? Patient states the prescription was sent for 3 months, but she only wants it for 1 month at a time. She she has 3 more refills on this prescription, but her insurance will not pay for it. She states she has been trying for 6 weeks to get it straightened out and has only one pen for the end of the month. Please advise.

## 2020-09-18 MED ORDER — PRALUENT 75 MG/ML ~~LOC~~ SOAJ: 75.0000 mg | SUBCUTANEOUS | 11 refills | Status: DC

## 2020-09-18 NOTE — Telephone Encounter (Signed)
Called and lmomed the pt that I would correct the rx sent for 2 pens refill of 11 to the pharmacy on file and to call is back if we need corrections

## 2021-01-12 NOTE — Progress Notes (Signed)
Cardiology Office Note:    Date:  01/15/2021   ID:  ROWENA MOILANEN, DOB 06-06-1941, MRN [id number]  PCP:  Willey Blade, MD   [facility] Providers Cardiologist:  Ena Dawley, MD (Inactive) {    Referring MD: Willey Blade, MD    History of Present Illness:    GIULLIANA MCROBERTS is a 80 y.o. female with a hx of mild AI, anxiety, arthritis, depression, diverticulosis, GERD, hypothyroidism, HLD with statin intolerance - on PCSK9 therapy, lichen sclerosis, and former tobacco abuse who was previously followed by Dr. Meda Coffee who now returns to clinic for follow-up of her HLD.  Last saw Dr. Meda Coffee on 06/29/20 where she was doing well from a CV standpoint. Compliant with all medications. Tolerating praulent without issues.  Today, the patient states she overall feels well. No chest pain, shortness of breath, lightheadedness, dizziness. She walks 12miles without exertional symptoms. Has calf cramps at night which have been bothersome but no claudication symptoms. Of note, in 04/2020, she suffered a fall and hurt her leg and fractured her left wrist. She had a complicated recovery with episode of hypokalemia, possible serotonin syndrome and confusion. She had notably stopped all her medications at that time. Fortunately, this has all resolved and she feels back to baseline. She has resumed her home medications without issues.    Past Medical History:  Diagnosis Date  . Anxiety   . Arthritis    "left; maybe back" (01/13/2016)  . Cancer ([place])    skin pre-cancer  . Closed fracture of left distal radius   . Depression   . Diverticulitis   . Diverticulosis   . Family history of adverse reaction to anesthesia    "daughter gets really nauseous & has trouble getting intubated" (01/13/2016)  . GERD (gastroesophageal reflux disease)    HISTORY  . History of blood transfusion 1977   "related to OR"  . Hx of adenomatous colonic polyps   . Hyperlipidemia   . Hypertension   .  Hypothyroidism   . Lichen sclerosus 11/5807   Biopsy proven  . Menopause   . Mild aortic insufficiency   . Osteopenia   . Seasonal allergies     Past Surgical History:  Procedure Laterality Date  . ACHILLES TENDON SURGERY Right 04/25/2016   Procedure: ACHILLES TENDON REPAIR,PARTIAL EXCISION CALCANEOUS;  Surgeon: [name], MD;  Location: [place];  Service: Orthopedics;  Laterality: Right;  . APPENDECTOMY  1977  . BACK SURGERY    . BLEPHAROPLASTY Bilateral   . CARDIAC CATHETERIZATION  ~ 2014  . CARPAL TUNNEL RELEASE Right   . CATARACT EXTRACTION W/ INTRAOCULAR LENS  IMPLANT, BILATERAL Bilateral   . CERVICAL FUSION  1999   C5-7  . DILATATION & CURETTAGE/HYSTEROSCOPY WITH TRUECLEAR N/A 05/02/2013   Procedure: DILATATION & CURETTAGE/HYSTEROSCOPY WITH TRUECLEAR ;  Surgeon: Anastasio Auerbach, MD;  Location: [facility];  Service: Gynecology;  Laterality: N/A;  . DILATION AND CURETTAGE OF UTERUS    . HARDWARE REMOVAL Left 12/12/2013   Procedure:  LEFT PATELLA HARDWARE REMOVAL;  Surgeon: [name], MD;  Location: [place];  Service: Orthopedics;  Laterality: Left;  . HYSTEROSCOPY    . JOINT REPLACEMENT    . KNEE ARTHROSCOPY Left 12/12/2013   Procedure: LEFT PATELLA ARTHROSCOPY KNEE WITH DEBRIDEMENT/SHAVING (CONDROPLASTY), LYSIS OF ADHESIONS;  Surgeon: [name], MD;  Location: [place];  Service: Orthopedics;  Laterality: Left;  . LEFT HEART CATHETERIZATION WITH CORONARY ANGIOGRAM N/A 04/22/2014  Procedure: LEFT HEART CATHETERIZATION WITH CORONARY ANGIOGRAM;  Surgeon: Sinclair Grooms, MD;  Location: [facility];  Service: Cardiovascular;  Laterality: N/A;  . LUMBAR [place] SURGERY  2004   L5  . OPEN REDUCTION INTERNAL FIXATION (ORIF) DISTAL RADIAL FRACTURE Left 05/07/2020   Procedure: OPEN REDUCTION INTERNAL FIXATION (ORIF) DISTAL RADIAL FRACTURE;  Surgeon: Hiram Gash, MD;  Location: [place];  Service:  Orthopedics;  Laterality: Left;  block in preop  . PARTIAL KNEE ARTHROPLASTY Left 01/13/2016   Procedure: LEFT UNICOMPARTMENTAL KNEE;  Surgeon: [name], MD;  Location: [place];  Service: Orthopedics;  Laterality: Left;  . PATELLA RECONSTRUCTION Left 1994  . REPLACEMENT UNICONDYLAR JOINT KNEE Left 01/13/2016  . RIGHT OOPHORECTOMY  1978  . TONSILLECTOMY      Current Medications: Current Meds  Medication Sig  . Acetylcysteine (NAC PO) Take by mouth.  . Alirocumab (PRALUENT) 75 MG/ML SOAJ Inject 75 mg into the skin every 14 (fourteen) days.  . Cholecalciferol (VITAMIN D3) 5000 UNITS TABS Take 1 tablet by mouth every Monday, Wednesday, and Friday.   . DULoxetine (CYMBALTA) 20 MG capsule Take 20 mg by mouth daily.  Lowanda Foster (XIIDRA OP) Apply to eye 2 (two) times daily. For dry eyes  . MAGNESIUM PO Take by mouth.  . potassium chloride (KLOR-CON) 10 MEQ tablet Take 20 mEq by mouth daily.  . Probiotic Product (PROBIOTIC PO) Take 1-2 capsules by mouth as needed (for GI support).   . SYNTHROID 25 MCG tablet Take 25 mcg by mouth daily before breakfast.   . thyroid (ARMOUR) 90 MG tablet Take 90 mg by mouth daily.    Current Facility-Administered Medications for the 01/15/21 encounter (Office Visit) with Freada Bergeron, MD  Medication  . 0.9 %  sodium chloride infusion     Allergies:   Aleve [naproxen sodium], Aspirin, Atorvastatin, Clarithromycin, Crestor  [rosuvastatin calcium], Durezol [difluprednate], Olopatadine hcl, Ondansetron, Penicillins, Pitavastatin, Statins, Chlorthalidone, Crestor [rosuvastatin], Other, Prednisone, Adhesive [tape], and Zofran [ondansetron hcl]   Social History   Socioeconomic History  . Marital status: Divorced    Spouse name: Not on file  . Number of children: 2  . Years of education: Not on file  . Highest education level: Not on file  Occupational History  . Occupation: RESEARCH ASSIST    Employer: [employer]    Comment: Retired    Tobacco Use  . Smoking status: Former Smoker    Packs/day: 1.00    Years: 27.00    Pack years: 27.00    Types: Cigarettes    Quit date: 12/10/1983    Years since quitting: 37.1  . Smokeless tobacco: Never Used  Vaping Use  . Vaping Use: Never used  Substance and Sexual Activity  . Alcohol use: Yes    Comment: 01/13/2016 "I'll have a drink < 2 times/month"  . Drug use: No  . Sexual activity: Never    Birth control/protection: Post-menopausal  Other Topics Concern  . Not on file  Social History Narrative   Daily caffeine    Social Determinants of Health   Financial Resource Strain: Not on file  Food Insecurity: Not on file  Transportation Needs: Not on file  Physical Activity: Not on file  Stress: Not on file  Social Connections: Not on file     Family History: The patient's family history includes Allergies in her mother; Heart disease in her father. There is no history of Colon cancer.  ROS:   Please  see the history of present illness.    Review of Systems  Constitutional: Negative for chills and fever.  HENT: Negative for congestion.   Eyes: Negative for blurred vision.  Respiratory: Negative for shortness of breath.   Cardiovascular: Negative for chest pain, palpitations, orthopnea, claudication, leg swelling and PND.  Gastrointestinal: Negative for nausea and vomiting.  Genitourinary: Negative for hematuria.  Musculoskeletal: Positive for joint pain.  Neurological: Negative for dizziness and loss of consciousness.  Psychiatric/Behavioral: Positive for memory loss.    EKGs/Labs/Other Studies Reviewed:    The following studies were reviewed today: TTE 2019/06/07: IMPRESSIONS  1. Left ventricular ejection fraction, by visual estimation, is 60 to  65%. The left ventricle has normal function. Normal left ventricular size.  There is mildly increased left ventricular hypertrophy.  2. Left ventricular diastolic Doppler parameters are indeterminate  pattern of LV  diastolic filling.  3. Global right ventricle has normal systolic function.The right  ventricular size is normal. No increase in right ventricular wall  thickness.  4. Left atrial size was normal.  5. Right atrial size was normal.  6. The mitral valve is normal in structure. No evidence of mitral valve  regurgitation.  7. The tricuspid valve is normal in structure. Tricuspid valve  regurgitation was not visualized by color flow Doppler.  8. The aortic valve is tricuspid Aortic valve regurgitation is mild by  color flow Doppler.  9. The pulmonic valve was not well visualized. Pulmonic valve  regurgitation is not visualized by color flow Doppler.  10. The inferior vena cava is normal in size with greater than 50%  respiratory variability, suggesting right atrial pressure of 3 mmHg.  EKG:  No new tracing today  Recent Labs: 05/13/2020: ALT 19; Hemoglobin 15.8; Magnesium 2.2; Platelets 410 06/29/2020: BUN 10; Creatinine, Ser 0.62; Potassium 4.5; Sodium 140  Recent Lipid Panel    Component Value Date/Time   CHOL 145 08/21/2019 0833   TRIG 174 (H) 08/21/2019 0833   HDL 54 08/21/2019 0833   CHOLHDL 2.7 08/21/2019 0833   CHOLHDL 4 07/15/2014 0835   VLDL 25.2 07/15/2014 0835   LDLCALC 62 08/21/2019 0833   LDLDIRECT 206.1 08/01/2007 0917      Physical Exam:    VS:  BP 138/84   Pulse (!) 57   Ht 5' (1.524 m)   Wt 157 lb 12.8 oz (71.6 kg)   SpO2 96%   BMI 30.82 kg/m     Wt Readings from Last 3 Encounters:  01/15/21 157 lb 12.8 oz (71.6 kg)  06/29/20 149 lb 9.6 oz (67.9 kg)  05/20/20 148 lb 9.6 oz (67.4 kg)     GEN:  Well nourished, well developed in no acute distress HEENT: Normal NECK: No JVD; No carotid bruits CARDIAC: RRR, no murmurs, rubs, gallops RESPIRATORY:  Clear to auscultation without rales, wheezing or rhonchi  ABDOMEN: Soft, non-tender, non-distended MUSCULOSKELETAL:  No edema; No deformity  SKIN: Warm and dry NEUROLOGIC:  Alert and oriented x  3 PSYCHIATRIC:  Normal affect   ASSESSMENT:    1. Mixed hyperlipidemia   2. Hypokalemia   3. Hypothyroidism, unspecified type   4. Bilateral leg cramps    PLAN:    In order of problems listed above:  #HTN: Controlled off medications. -Continue lifestyle modifications and low Na diet  #HLD: Intolerant of statins. On PCSK9i. Lipids checked by PCP; will obtained records. -Continue praulent 75mg  q14d -Obtain recent cholesterol panel from PCP  #Hypothyroidism: -Continue synthroid 10mcg daily  #Bilateral Leg Cramps:  Occurring at night. No claudication symptoms. Resumed potassium supplemenation. -Take Mag at night -Diet tonic water -Increase hydration -Continue B complex vitamins -Compression socks  #Hypokalemia: Well controlled. -Continue potassium supplementation 40mEq daily -Not on diuretics -Follow-up labs with PCP    Medication Adjustments/Labs and Tests Ordered: Current medicines are reviewed at length with the patient today.  Concerns regarding medicines are outlined above.  No orders of the defined types were placed in this encounter.  No orders of the defined types were placed in this encounter.   Patient Instructions  Medication Instructions:   Your physician recommends that you continue on your current medications as directed. Please refer to the Current Medication list given to you today.  *If you need a refill on your cardiac medications before your next appointment, please call your pharmacy*   Follow-Up: At [facility], you and your health needs are our priority.  As part of our continuing mission to provide you with exceptional heart care, we have created designated Provider Care Teams.  These Care Teams include your primary Cardiologist (physician) and Advanced Practice Providers (APPs -  Physician Assistants and Nurse Practitioners) who all work together to provide you with the care you need, when you need it.  We recommend signing up for the  patient portal called "MyChart".  Sign up information is provided on this After Visit Summary.  MyChart is used to connect with patients for Virtual Visits (Telemedicine).  Patients are able to view lab/test results, encounter notes, upcoming appointments, etc.  Non-urgent messages can be sent to your provider as well.   To learn more about what you can do with MyChart, go to [email or web address].    Your next appointment:   1 year(s)  The format for your next appointment:   In Person  Provider:   Gwyndolyn Kaufman, MD        Signed, Freada Bergeron, MD  01/15/2021 10:40 AM    [place]

## 2021-01-15 ENCOUNTER — Encounter: Payer: Self-pay | Admitting: Cardiology

## 2021-01-15 ENCOUNTER — Ambulatory Visit (INDEPENDENT_AMBULATORY_CARE_PROVIDER_SITE_OTHER): Payer: Medicare Other | Admitting: Cardiology

## 2021-01-15 ENCOUNTER — Other Ambulatory Visit: Payer: Self-pay

## 2021-01-15 VITALS — BP 138/84 | HR 57 | Ht 60.0 in | Wt 157.8 lb

## 2021-01-15 DIAGNOSIS — E782 Mixed hyperlipidemia: Secondary | ICD-10-CM

## 2021-01-15 DIAGNOSIS — E039 Hypothyroidism, unspecified: Secondary | ICD-10-CM

## 2021-01-15 DIAGNOSIS — E876 Hypokalemia: Secondary | ICD-10-CM | POA: Diagnosis not present

## 2021-01-15 DIAGNOSIS — R252 Cramp and spasm: Secondary | ICD-10-CM | POA: Diagnosis not present

## 2021-01-15 NOTE — Patient Instructions (Signed)

## 2021-04-21 ENCOUNTER — Telehealth: Payer: Self-pay | Admitting: Cardiology

## 2021-04-21 ENCOUNTER — Other Ambulatory Visit: Payer: Medicare Other | Admitting: *Deleted

## 2021-04-21 ENCOUNTER — Other Ambulatory Visit: Payer: Self-pay

## 2021-04-21 ENCOUNTER — Ambulatory Visit (INDEPENDENT_AMBULATORY_CARE_PROVIDER_SITE_OTHER): Payer: Medicare Other | Admitting: Interventional Cardiology

## 2021-04-21 ENCOUNTER — Encounter: Payer: Self-pay | Admitting: Interventional Cardiology

## 2021-04-21 VITALS — BP 130/70 | HR 73 | Ht 63.0 in | Wt 157.4 lb

## 2021-04-21 DIAGNOSIS — E782 Mixed hyperlipidemia: Secondary | ICD-10-CM | POA: Diagnosis not present

## 2021-04-21 DIAGNOSIS — R0602 Shortness of breath: Secondary | ICD-10-CM

## 2021-04-21 DIAGNOSIS — I10 Essential (primary) hypertension: Secondary | ICD-10-CM

## 2021-04-21 DIAGNOSIS — R42 Dizziness and giddiness: Secondary | ICD-10-CM

## 2021-04-21 DIAGNOSIS — R0600 Dyspnea, unspecified: Secondary | ICD-10-CM | POA: Diagnosis not present

## 2021-04-21 DIAGNOSIS — R0609 Other forms of dyspnea: Secondary | ICD-10-CM

## 2021-04-21 DIAGNOSIS — E039 Hypothyroidism, unspecified: Secondary | ICD-10-CM

## 2021-04-21 NOTE — Progress Notes (Signed)
Cardiology Office Note   Date:  04/21/2021   ID:  Marilyn Rivas, DOB 01/04/1941, MRN [id number]  PCP:  Andi Devon, MD    No chief complaint on file.  DOE, diaphoresis, fatigue  Wt Readings from Last 3 Encounters:  04/21/21 157 lb 6.4 oz (71.4 kg)  01/15/21 157 lb 12.8 oz (71.6 kg)  06/29/20 149 lb 9.6 oz (67.9 kg)       History of Present Illness: Marilyn Rivas is a 80 y.o. female  with a hx of mild AI, anxiety, arthritis, depression, diverticulosis, GERD, hypothyroidism, HLD with statin intolerance - on PCSK9 therapy, lichen sclerosis, and former tobacco abuse who was previously followed by Dr. Delton See who now returns to clinic for acute visit.  Patient called in with the following: "very sob with any slight exertion. Pt states yesterday pt she was in a store with her daughter, she started getting really dizzy, started seeing "spots," got nauseated, sob, and felt pre-syncopal.  She said she had to sit down, drink lots of water, then she was able to recover to get home.  She states she is unsure if she is having palpitations or fluttering."  in 04/2020, she suffered a fall and hurt her leg and fractured her left wrist. She had a complicated recovery with episode of hypokalemia, possible serotonin syndrome and confusion. She had notably stopped all her medications at that time.  No chest pain. No recent falls. She has been craving bread.  She has had some nausea.  SHe has been having frequent bowel movements.  She feels that her urine smells like vinegar.    Denies : Chest pain. Leg edema. Nitroglycerin use. Orthopnea. Palpitations. Paroxysmal nocturnal dyspnea. Syncope.    Past Medical History:  Diagnosis Date   Anxiety    Arthritis    "left; maybe back" (01/13/2016)   Cancer ([facility])    skin pre-cancer   Closed fracture of left distal radius    Depression    Diverticulitis    Diverticulosis    Family history of adverse reaction to anesthesia     "daughter gets really nauseous & has trouble getting intubated" (01/13/2016)   GERD (gastroesophageal reflux disease)    HISTORY   History of blood transfusion 1977   "related to OR"   Hx of adenomatous colonic polyps    Hyperlipidemia    Hypertension    Hypothyroidism    Lichen sclerosus 01/2014   Biopsy proven   Menopause    Mild aortic insufficiency    Osteopenia    Seasonal allergies     Past Surgical History:  Procedure Laterality Date   ACHILLES TENDON SURGERY Right 04/25/2016   Procedure: ACHILLES TENDON REPAIR,PARTIAL EXCISION CALCANEOUS;  Surgeon: Loreta Ave, MD;  Location: [facility];  Service: Orthopedics;  Laterality: Right;   APPENDECTOMY  1977   BACK SURGERY     BLEPHAROPLASTY Bilateral    CARDIAC CATHETERIZATION  ~ 2014   CARPAL TUNNEL RELEASE Right    CATARACT EXTRACTION W/ INTRAOCULAR LENS  IMPLANT, BILATERAL Bilateral    CERVICAL FUSION  1999   C5-7   DILATATION & CURETTAGE/HYSTEROSCOPY WITH TRUECLEAR N/A 05/02/2013   Procedure: DILATATION & CURETTAGE/HYSTEROSCOPY WITH TRUECLEAR ;  Surgeon: Dara Lords, MD;  Location: [facility];  Service: Gynecology;  Laterality: N/A;   DILATION AND CURETTAGE OF UTERUS     HARDWARE REMOVAL Left 12/12/2013   Procedure:  LEFT PATELLA HARDWARE REMOVAL;  Surgeon: Loreta Ave, MD;  Location:  [place];  Service: Orthopedics;  Laterality: Left;   HYSTEROSCOPY     JOINT REPLACEMENT     KNEE ARTHROSCOPY Left 12/12/2013   Procedure: LEFT PATELLA ARTHROSCOPY KNEE WITH DEBRIDEMENT/SHAVING (CONDROPLASTY), LYSIS OF ADHESIONS;  Surgeon: [name], MD;  Location: [place];  Service: Orthopedics;  Laterality: Left;   LEFT HEART CATHETERIZATION WITH CORONARY ANGIOGRAM N/A 04/22/2014   Procedure: LEFT HEART CATHETERIZATION WITH CORONARY ANGIOGRAM;  Surgeon: Sinclair Grooms, MD;  Location: [facility];  Service: Cardiovascular;  Laterality: N/A;   LUMBAR [place] SURGERY  2004   L5    OPEN REDUCTION INTERNAL FIXATION (ORIF) DISTAL RADIAL FRACTURE Left 05/07/2020   Procedure: OPEN REDUCTION INTERNAL FIXATION (ORIF) DISTAL RADIAL FRACTURE;  Surgeon: Hiram Gash, MD;  Location: [place];  Service: Orthopedics;  Laterality: Left;  block in preop   PARTIAL KNEE ARTHROPLASTY Left 01/13/2016   Procedure: LEFT UNICOMPARTMENTAL KNEE;  Surgeon: [name], MD;  Location: [place];  Service: Orthopedics;  Laterality: Left;   PATELLA RECONSTRUCTION Left 1994   REPLACEMENT UNICONDYLAR JOINT KNEE Left 01/13/2016   RIGHT OOPHORECTOMY  1978   TONSILLECTOMY       Current Outpatient Medications  Medication Sig Dispense Refill   Acetylcysteine (NAC PO) Take by mouth.     Alirocumab (PRALUENT) 75 MG/ML SOAJ Inject 75 mg into the skin every 14 (fourteen) days. 2 mL 11   Biotin w/ Vitamins C & E (HAIR SKIN & NAILS GUMMIES PO) Take by mouth.     Cholecalciferol (VITAMIN D3) 5000 UNITS TABS Take 1 tablet by mouth every Monday, Wednesday, and Friday.      Digestive Enzymes (DIGESTIVE ENZYME PO) Take by mouth.     DULoxetine (CYMBALTA) 20 MG capsule Take 20 mg by mouth daily.     levocetirizine (XYZAL) 5 MG tablet Take 5 mg by mouth 2 (two) times daily.     Lifitegrast (XIIDRA OP) Apply to eye 2 (two) times daily. For dry eyes     Magnesium 200 MG TABS Take 200 mg by mouth. Per patient taking 2 tablets in the am and taking 1 tablet in the pm     potassium chloride (KLOR-CON) 10 MEQ tablet Take 20 mEq by mouth daily.     Probiotic Product (PROBIOTIC PO) Take 1-2 capsules by mouth as needed (for GI support).      SYNTHROID 25 MCG tablet Take 25 mcg by mouth daily before breakfast.      thyroid (ARMOUR) 90 MG tablet Take 90 mg by mouth daily.      Turmeric (QC TUMERIC COMPLEX PO) Take 1,500 mg by mouth daily. Per patient taking 2 tablets     Current Facility-Administered Medications  Medication Dose Route Frequency Provider Last Rate Last Admin   0.9 %  sodium chloride infusion   500 mL Intravenous Continuous Irene Shipper, MD       Facility-Administered Medications Ordered in Other Visits  Medication Dose Route Frequency Provider Last Rate Last Admin   chlorhexidine (HIBICLENS) 4 % liquid 4 application  60 mL Topical Once Dwana Melena L, PA-C       chlorhexidine (HIBICLENS) 4 % liquid 4 application  60 mL Topical Once Dwana Melena L, PA-C       lactated ringers infusion   Intravenous Continuous Aundra Dubin, PA-C   New Bag at 01/13/16 1219    Allergies:   Aleve [naproxen sodium], Aspirin, Atorvastatin, Clarithromycin, Crestor  [rosuvastatin calcium],  Durezol [difluprednate], Olopatadine hcl, Ondansetron, Penicillins, Pitavastatin, Statins, Chlorthalidone, Crestor [rosuvastatin], Other, Prednisone, Adhesive [tape], and Zofran [ondansetron hcl]    Social History:  The patient  reports that she quit smoking about 37 years ago. Her smoking use included cigarettes. She has a 27.00 pack-year smoking history. She has never used smokeless tobacco. She reports current alcohol use. She reports that she does not use drugs.   Family History:  The patient's family history includes Allergies in her mother; Heart disease in her father.    ROS:  Please see the history of present illness.   Otherwise, review of systems are positive for diaphoresis.   All other systems are reviewed and negative.    PHYSICAL EXAM: VS:  BP 130/70   Pulse 73   Ht $R'5\' 3"'Ud$  (1.6 m)   Wt 157 lb 6.4 oz (71.4 kg)   SpO2 95%   BMI 27.88 kg/m  , BMI Body mass index is 27.88 kg/m. GEN: Well nourished, well developed, in no acute distress HEENT: normal Neck: no JVD, carotid bruits, or masses Cardiac: RRR; no murmurs, rubs, or gallops,no edema  Respiratory:  clear to auscultation bilaterally, normal work of breathing GI: soft, nontender, nondistended, + BS MS: no deformity or atrophy Skin: warm and dry, no rash Neuro:  Strength and sensation are intact Psych: euthymic mood, full affect   EKG:    The ekg ordered today demonstrates NSR, Lateral T wave inversion   Recent Labs: 05/13/2020: ALT 19; Hemoglobin 15.8; Magnesium 2.2; Platelets 410 06/29/2020: BUN 10; Creatinine, Ser 0.62; Potassium 4.5; Sodium 140   Lipid Panel    Component Value Date/Time   CHOL 145 08/21/2019 0833   TRIG 174 (H) 08/21/2019 0833   HDL 54 08/21/2019 0833   CHOLHDL 2.7 08/21/2019 0833   CHOLHDL 4 07/15/2014 0835   VLDL 25.2 07/15/2014 0835   LDLCALC 62 08/21/2019 0833   LDLDIRECT 206.1 08/01/2007 0917     Other studies Reviewed: Additional studies/ records that were reviewed today with results demonstrating: Prior ECG reviewed.  LDL 62.   ASSESSMENT AND PLAN:  HTN: Previously was on chlorthalidone.  She was intolerant to this.  Blood pressure currently controlled without medication.  She does try to eat a healthy diet minimizing salt and processed foods.   Hyperlipidemia: Tolerating her PCSK9 inhibitor. Dizziness/diaphoresis: Encouraged her to stay well-hydrated.  She has been sweating more often.  Lab work pending.  She does not feel like she has a fever.  She just feels like she is clammy sometimes.  No changes noted on ECG compared to prior. She has never had COVID.  She denies any upper respiratory symptoms.  She does feel like her urine has a different odor at times.  I asked her to follow-up with her primary care doctor to get a urinalysis done. C-Met, CBC, TSH, BNP ordered given some SHOB with activity as well. No obvious CHF signs on exam.   Current medicines are reviewed at length with the patient today.  The patient concerns regarding her medicines were addressed.  The following changes have been made:  No change  Labs/ tests ordered today include:  No orders of the defined types were placed in this encounter.   Recommend 150 minutes/week of aerobic exercise Low fat, low carb, high fiber diet recommended  Disposition:   FU as scheduled with Dr. Johney Frame   Signed, Larae Grooms, MD  04/21/2021 5:01 PM    [address],  [place]  [address] Phone: [phone]; Fax: [phone]

## 2021-04-21 NOTE — Telephone Encounter (Signed)
Definitely labs.  Maybe she can get them before seeing me since it is at the end of the day.  CBC, CMet, BNP, TSH.  For shortness of breath, dizziness.  Thanks.    JV  ----- Message -----  From: Nuala Alpha, LPN  Sent: [id number]  12:17 PM EDT  To: Jettie Booze, MD, Precious Gilding, RN   Pt aware to come in around 3:30-3:45 pm prior to seeing Dr. Irish Lack today, so we can draw labs on her prior to her visit with him.  Orders placed.  Pt verbalized understanding and agrees with this plan.

## 2021-04-21 NOTE — Telephone Encounter (Signed)
   Pt c/o Shortness Of Breath: STAT if SOB developed within the last 24 hours or pt is noticeably SOB on the phone  1. Are you currently SOB (can you hear that pt is SOB on the phone)? No  2. How long have you been experiencing SOB? At least a week  3. Are you SOB when sitting or when up moving around? Moving around  4. Are you currently experiencing any other symptoms? Gets dizzy  Pt said she is having bad SOB, she gets SOB even during slight exertion. She said she gets dizzy and feels like she is going to passed out as well. She wanted to be seen as soon as possible

## 2021-04-21 NOTE — Telephone Encounter (Addendum)
Pt is calling in with ongoing and continuous complaints of dizziness, lightheadedness, nausea, sweating, sob, doe, and pre-syncopal episodes, for the last week.   Pt states she is also very sob with any slight exertion. Pt states yesterday pt she was in a store with her daughter, she started getting really dizzy, started seeing "spots," got nauseated, sob, and felt pre-syncopal.  She said she had to sit down, drink lots of water, then she was able to recover to get home.  She states she is unsure if she is having palpitations or fluttering. She denies having chest pain at this time. She states she does get extremely flushed with these episodes.  Pt states she had "another spell" this morning. Pt states she is taking all her meds as prescribed.  She states her smart watch isn't working appropriately, so she is unable to report to me any BP or HR readings. Pt states she cannot do much of nothing for the dizziness sets in, she gets super winded, and feels like she is going to pass out. Pt denies passing out, only feels pre-syncopal. Pt does have a history of elevated BPs and hypothyroidism. Pt states she needs to be seen today for symptoms.  Pt states she is currently at home with her daughter, who can drive her to an appt if she can get one.  Pt is refusing the ER at this time. Scheduled the pt to be seen by our DOD Dr. Irish Lack today at 4:30 pm.  She is aware to arrive 15 mins prior to this appt.  Advised the pt to have her daughter drive her.  Advised her if her symptoms worsen between now and her appt at 4:30 pm, she should go to the ER to be evaluated.  Pt verbalized understanding and agrees with this plan. Will make Dr. Irish Lack aware of added pt to his schedule.

## 2021-04-21 NOTE — Patient Instructions (Signed)
Medication Instructions:  Your physician recommends that you continue on your current medications as directed. Please refer to the Current Medication list given to you today.  *If you need a refill on your cardiac medications before your next appointment, please call your pharmacy*   Lab Work: [place] If you have labs (blood work) drawn today and your tests are completely normal, you will receive your results only by: [place] (if you have MyChart) OR A paper copy in the mail If you have any lab test that is abnormal or we need to change your treatment, we will call you to review the results.   Testing/Procedures: NONE   Follow-Up: With Primary Cardiologist as scheduled  At [facility], you and your health needs are our priority.  As part of our continuing mission to provide you with exceptional heart care, we have created designated Provider Care Teams.  These Care Teams include your primary Cardiologist (physician) and Advanced Practice Providers (APPs -  Physician Assistants and Nurse Practitioners) who all work together to provide you with the care you need, when you need it.   Your next appointment:   Due April 2023  The format for your next appointment:   In Person  Provider:   You may see Gwyndolyn Kaufman, MD or one of the following Advanced Practice Providers on your designated Care Team:   Richardson Dopp, PA-C Robbie Lis, [place]   Other Instructions Follow up with Primary Care Provider for urine odor

## 2021-04-22 ENCOUNTER — Telehealth: Payer: Self-pay | Admitting: Cardiology

## 2021-04-22 LAB — COMPREHENSIVE METABOLIC PANEL
ALT: 29 IU/L (ref 0–32)
AST: 20 IU/L (ref 0–40)
Albumin/Globulin Ratio: 2.5 — ABNORMAL HIGH (ref 1.2–2.2)
Albumin: 4.3 g/dL (ref 3.7–4.7)
Alkaline Phosphatase: 117 IU/L (ref 44–121)
BUN/Creatinine Ratio: 37 — ABNORMAL HIGH (ref 12–28)
BUN: 22 mg/dL (ref 8–27)
Bilirubin Total: <0.2 mg/dL (ref 0.0–1.2)
CO2: 20 mmol/L (ref 20–29)
Calcium: 9.7 mg/dL (ref 8.7–10.3)
Chloride: 105 mmol/L (ref 96–106)
Creatinine, Ser: 0.6 mg/dL (ref 0.57–1.00)
Globulin, Total: 1.7 g/dL (ref 1.5–4.5)
Glucose: 100 mg/dL — ABNORMAL HIGH (ref 65–99)
Potassium: 4 mmol/L (ref 3.5–5.2)
Sodium: 140 mmol/L (ref 134–144)
Total Protein: 6 g/dL (ref 6.0–8.5)
eGFR: 91 mL/min/{1.73_m2} (ref >59)

## 2021-04-22 LAB — CBC
Hematocrit: 45 % (ref 34.0–46.6)
Hemoglobin: 15.7 g/dL (ref 11.1–15.9)
MCH: 32 pg (ref 26.6–33.0)
MCHC: 34.9 g/dL (ref 31.5–35.7)
MCV: 92 fL (ref 79–97)
Platelets: 254 10*3/uL (ref 150–450)
RBC: 4.9 x10E6/uL (ref 3.77–5.28)
RDW: 12.9 % (ref 11.7–15.4)
WBC: 7.9 10*3/uL (ref 3.4–10.8)

## 2021-04-22 LAB — TSH: TSH: 0.066 u[IU]/mL — ABNORMAL LOW (ref 0.450–4.500)

## 2021-04-22 LAB — PRO B NATRIURETIC PEPTIDE: NT-Pro BNP: 145 pg/mL (ref 0–738)

## 2021-04-22 NOTE — Telephone Encounter (Signed)
Follow up:     Patient retuning a call back for results.

## 2021-04-22 NOTE — Telephone Encounter (Signed)
Marilyn Booze, MD  04/22/2021  9:23 AM EDT     Electrolytes, renal function, CBC well-controlled.  No evidence of volume overload.  TSH is low indicating that her thyroid is overactive.  This may be causing her frequent sweating spells.  Please cc her primary care doctor for further management.   The patient has been notified of the result and verbalized understanding.  All questions (if any) were answered.  Pt aware I will forward a copy of her results to her PCP Dr. Karlton Lemon, for further review and management of her thyroid medicine.  Pt states she will be going to her PCP today for a urine test, and will follow-up with Dr. Karlton Lemon at that time about these results.  Pt verbalized understanding and agrees with this plan.

## 2021-05-24 ENCOUNTER — Other Ambulatory Visit (HOSPITAL_COMMUNITY): Payer: Self-pay | Admitting: Orthopedic Surgery

## 2021-05-24 ENCOUNTER — Other Ambulatory Visit: Payer: Self-pay | Admitting: Orthopedic Surgery

## 2021-05-24 DIAGNOSIS — M25562 Pain in left knee: Secondary | ICD-10-CM

## 2021-05-31 ENCOUNTER — Encounter (HOSPITAL_COMMUNITY)
Admission: RE | Admit: 2021-05-31 | Discharge: 2021-05-31 | Disposition: A | Discharge status: Home / Self Care | Payer: Medicare Other | Source: Ambulatory Visit | Attending: Orthopedic Surgery | Admitting: Orthopedic Surgery

## 2021-05-31 ENCOUNTER — Other Ambulatory Visit: Payer: Self-pay

## 2021-05-31 ENCOUNTER — Ambulatory Visit (HOSPITAL_COMMUNITY)
Admission: RE | Admit: 2021-05-31 | Discharge: 2021-05-31 | Disposition: A | Discharge status: Home / Self Care | Payer: Medicare Other | Source: Ambulatory Visit | Attending: Orthopedic Surgery | Admitting: Orthopedic Surgery

## 2021-05-31 DIAGNOSIS — M25562 Pain in left knee: Secondary | ICD-10-CM | POA: Diagnosis not present

## 2021-05-31 MED ORDER — TECHNETIUM TC 99M MEDRONATE IV KIT
20.0000 | PACK | Freq: Once | INTRAVENOUS | Status: AC | PRN
  Administered 2021-05-31: 20 via INTRAVENOUS

## 2021-07-27 ENCOUNTER — Telehealth: Payer: Self-pay | Admitting: Cardiology

## 2021-07-27 MED ORDER — PRALUENT 75 MG/ML ~~LOC~~ SOAJ: 75.0000 mg | SUBCUTANEOUS | 11 refills | Status: DC

## 2021-07-27 NOTE — Telephone Encounter (Signed)
*  STAT* If patient is at the pharmacy, call can be transferred to refill team.   1. Which medications need to be refilled? (please list name of each medication and dose if known)  Alirocumab (PRALUENT) 75 MG/ML SOAJ  2. Which pharmacy/location (including street and city if local pharmacy) is medication to be sent to? Pt said she usually gets it free from somewhere else but she is not sure of the company name  3. Do they need a 30 day or 90 day supply?

## 2021-07-27 NOTE — Telephone Encounter (Signed)
Pt in [employer] which is still good through 09/21/21. Just needs rx refilled to her normal pharmacy who should already be processing the grant on her previous fills for the past year. Pt is aware that refill has been sent in and to let us know once she needs her grant renewed next year.

## 2021-10-01 ENCOUNTER — Telehealth: Payer: Self-pay | Admitting: Cardiology

## 2021-10-01 NOTE — Telephone Encounter (Signed)
Pt c/o medication issue:  1. Name of Medication: Alirocumab ([place]) 75 MG/ML SOAJ  2. How are you currently taking this medication (dosage and times per day)? Inject every 14 days  3. Are you having a reaction (difficulty breathing--STAT)? no  4. What is your medication issue? Patient states the medication is going to cost her over $400, but she used to be able to get is at no cost.

## 2021-10-01 NOTE — Telephone Encounter (Signed)
Spoke with patient to notify her that we have sent message regarding cost of Praulent to our PharmD department for assistance. Patient states she took a dose today and will need to take another in 14 days.  Message forwarded to PharmD.  Patient verbalized understanding and thanked me for calling to update.

## 2021-10-04 NOTE — Telephone Encounter (Signed)
Called the pt and was able to give her the healthwell information to take to the pharmacy is as follows: START DATE 09/22/2021   END DATE 09/21/2022   ASSISTANCE TYPE Co-pay   PAID $0.00   PENDING $0.00   BALANCE $2500.00 Pharmacy Card CARD NO. [id number]   CARD STATUS Active   BIN [id number]   PCN [id number]   PC GROUP [id number]   HELP DESK [phone]   PROVIDER PDMI   PROCESSOR PDMI

## 2021-10-05 NOTE — Telephone Encounter (Signed)
Patient called and said she did not receive documentation needed to pick up the medication from the pharmacy. The patient can not afford the $500 copay from the pharmacy

## 2021-10-05 NOTE — Telephone Encounter (Signed)
Called [facility] and gave them the healthwell grant information.  Called pt and let her know it should be $0 when she picks it up.

## 2022-03-22 ENCOUNTER — Encounter: Payer: Self-pay | Admitting: Internal Medicine

## 2022-04-20 ENCOUNTER — Encounter: Payer: Self-pay | Admitting: Cardiology

## 2022-04-20 ENCOUNTER — Ambulatory Visit: Payer: Medicare Other | Admitting: Nurse Practitioner

## 2022-04-20 ENCOUNTER — Telehealth: Payer: Self-pay | Admitting: Cardiology

## 2022-04-20 NOTE — Telephone Encounter (Signed)
Patient c/o Palpitations:  High priority if patient c/o lightheadedness, shortness of breath, or chest pain  How long have you had palpitations/irregular HR/ Afib? Are you having the symptoms now? About 10 minutes    Are you currently experiencing lightheadedness, SOB or CP? no  Do you have a history of afib (atrial fibrillation) or irregular heart rhythm? N/a   Have you checked your BP or HR? (document readings if available):  Hr 71   Are you experiencing any other symptoms? Pt states she has been experiencing some afib (readings from her watch). She states this is an emergency and wants to know if she needs to be seen sooner. Pt denies any lightheadedness, SOB or CP

## 2022-04-20 NOTE — Telephone Encounter (Signed)
Pt called to report that her apple watch has been saying on and off that she is in "AFIB"... she is under a lot of stress due to her sister passing away this week... she is feeling well except she has been a little "clammy" and tired but under the circumstances she says she feels better than she would expect.   She denies palpitations, dizziness, SOB.Marland Kitchen no chest pain.   It says her HR is now back in NSR and HR 86.   Temp 98.6  I moved up her appt to see Elwyn Reach NP on 04/22/22. She will send her apple watch strips to her My Chart for our review.    Addendum: I have advised them we have received the strips and although blurry... I will keep for the APP to review at her appt... we went over symptoms precautions and she will call back prior to her appt or call EMS for new and/or worsening symptoms. Pt unable to come in tomorrow due to the funeral planning.   Pt will also be sure that she is staying well hydrated.

## 2022-04-21 NOTE — Telephone Encounter (Signed)
Thank you for the advanced notice.

## 2022-04-21 NOTE — Progress Notes (Signed)
Cardiology Office Note:    Date:  04/22/2022   ID:  Marilyn Rivas, DOB [birth year], MRN [id number]  PCP:  Willey Blade, MD   [facility] Providers Cardiologist:  Ena Dawley, MD     Referring MD: Willey Blade, MD   Chief Complaint: possible a fib on smart watch  History of Present Illness:    Marilyn Rivas is a very pleasant 81 y.o. female with a hx of mild AI, normal coronaries on cardiac cath 2015, anxiety, depression, GERD, hypothyroidism, HLD with statin intolerance on PCSK9 inhibitor therapy, lichen sclerosis, former tobacco abuse, and hypokalemia.   Previously seen by Dr. Meda Coffee, care was transferred to Dr. Johney Frame.  In September 2021 she suffered a fall and hurt her leg and fractured her left wrist.  She had a complicated recovery with episode of hypokalemia, possible serotonin syndrome and confusion.  She had notably stopped all her for her medications at that time. Reported intolerant of chlorthalidone.   She was last seen in our office on 04/21/2021 by Dr. Irish Lack for an acute visit.  She called in stating she was very short of breath with any slight exertion.  She reported on the day prior to the visit she got really dizzy, started seeing spots, got nauseated, short of breath and felt presyncopal.  She had to sit down, drink lots of water and then was able to recover.  Reported she was unsure if she was having palpitations or fluttering. No chest pain. Reported frequent bowel movements and that her urine smelled like vinegar.  Was no evidence of volume overload.  Her electrolytes, renal function, and CBC were all within normal limits.  TSH was low indicating hyperthyroidism which may have caused her frequent sweating spells.  She was advised to follow-up with PCP.  She called our office 04/20/22 to report that her Apple watch reported A Fib. Her sister recently passed away which is causing her significant stress.  She denied palpitations, dizziness,  shortness of breath or chest pain.  She was scheduled for an office visit.  Today, she is here with one of her daughters for evaluation of recent a fib reported on her Apple watch. States it has been a very stressful week, one of her daughters passed away. She has been feeling dizzy, symptoms of dehydration.  Her daughter states she felt better after drinking an electrolyte water.  She denies chest pain, palpitations, DOE, presyncope, syncope.  We discussed that her rhythm is regular on the EKG tracings from her watch and is confirmed not to be a fib on EKG today.   Past Medical History:  Diagnosis Date   Anxiety    Arthritis    "left; maybe back" (01/13/2016)   Cancer ([place])    skin pre-cancer   Closed fracture of left distal radius    Depression    Diverticulitis    Diverticulosis    Family history of adverse reaction to anesthesia    "daughter gets really nauseous & has trouble getting intubated" (01/13/2016)   GERD (gastroesophageal reflux disease)    HISTORY   History of blood transfusion 1977   "related to OR"   Hx of adenomatous colonic polyps    Hyperlipidemia    Hypertension    Hypothyroidism    Lichen sclerosus 02/1244   Biopsy proven   Menopause    Mild aortic insufficiency    Osteopenia    Seasonal allergies     Past Surgical History:  Procedure Laterality Date   ACHILLES  TENDON SURGERY Right 04/25/2016   Procedure: ACHILLES TENDON REPAIR,PARTIAL EXCISION CALCANEOUS;  Surgeon: [name], MD;  Location: [place];  Service: Orthopedics;  Laterality: Right;   APPENDECTOMY  1977   BACK SURGERY     BLEPHAROPLASTY Bilateral    CARDIAC CATHETERIZATION  ~ 2014   CARPAL TUNNEL RELEASE Right    CATARACT EXTRACTION W/ INTRAOCULAR LENS  IMPLANT, BILATERAL Bilateral    CERVICAL FUSION  1999   C5-7   DILATATION & CURETTAGE/HYSTEROSCOPY WITH TRUECLEAR N/A 05/02/2013   Procedure: DILATATION & CURETTAGE/HYSTEROSCOPY WITH TRUECLEAR ;  Surgeon: Anastasio Auerbach, MD;  Location: [facility];  Service: Gynecology;  Laterality: N/A;   DILATION AND CURETTAGE OF UTERUS     HARDWARE REMOVAL Left 12/12/2013   Procedure:  LEFT PATELLA HARDWARE REMOVAL;  Surgeon: [name], MD;  Location: [place];  Service: Orthopedics;  Laterality: Left;   HYSTEROSCOPY     JOINT REPLACEMENT     KNEE ARTHROSCOPY Left 12/12/2013   Procedure: LEFT PATELLA ARTHROSCOPY KNEE WITH DEBRIDEMENT/SHAVING (CONDROPLASTY), LYSIS OF ADHESIONS;  Surgeon: [name], MD;  Location: [place];  Service: Orthopedics;  Laterality: Left;   LEFT HEART CATHETERIZATION WITH CORONARY ANGIOGRAM N/A 04/22/2014   Procedure: LEFT HEART CATHETERIZATION WITH CORONARY ANGIOGRAM;  Surgeon: Sinclair Grooms, MD;  Location: [facility] C[facility];  Service: Cardiovascular;  Laterality: N/A;   LUMBAR DISC SURGERY  2004   L5   OPEN REDUCTION INTERNAL FIXATION (ORIF) DISTAL RADIAL FRACTURE Left 05/07/2020   Procedure: OPEN REDUCTION INTERNAL FIXATION (ORIF) DISTAL RADIAL FRACTURE;  Surgeon: Hiram Gash, MD;  Location: [place];  Service: Orthopedics;  Laterality: Left;  block in preop   PARTIAL KNEE ARTHROPLASTY Left 01/13/2016   Procedure: LEFT UNICOMPARTMENTAL KNEE;  Surgeon: [name], MD;  Location: [place];  Service: Orthopedics;  Laterality: Left;   PATELLA RECONSTRUCTION Left 1994   REPLACEMENT UNICONDYLAR JOINT KNEE Left 01/13/2016   RIGHT OOPHORECTOMY  1978   TONSILLECTOMY      Current Medications: Current Meds  Medication Sig   Acetylcysteine (NAC PO) Take by mouth.   Alirocumab (PRALUENT) 75 MG/ML SOAJ Inject 75 mg into the skin every 14 (fourteen) days.   Biotin w/ Vitamins C & E (HAIR SKIN & NAILS GUMMIES PO) Take by mouth.   Cholecalciferol (VITAMIN D3) 5000 UNITS TABS Take 1 tablet by mouth every Monday, Wednesday, and Friday.    donepezil (ARICEPT) 5 MG tablet Take 5 mg by mouth daily.   DULoxetine (CYMBALTA) 20 MG capsule Take 20 mg by  mouth daily.   hyoscyamine (LEVSIN SL) 0.125 MG SL tablet Place 1 tablet (0.125 mg total) under the tongue every 4 (four) hours as needed.   Lifitegrast (XIIDRA OP) Apply to eye 2 (two) times daily. For dry eyes   Magnesium 200 MG TABS Take 200 mg by mouth. Per patient taking 2 tablets in the am and taking 1 tablet in the pm   potassium chloride (KLOR-CON) 10 MEQ tablet Take 20 mEq by mouth daily.   Probiotic Product (PROBIOTIC PO) Take 1-2 capsules by mouth as needed (for GI support).    thyroid (ARMOUR) 90 MG tablet Take 90 mg by mouth daily.    Current Facility-Administered Medications for the 04/22/22 encounter (Office Visit) with Emmaline Life, NP  Medication   0.9 %  sodium chloride infusion     Allergies:   Aleve [naproxen sodium], Aspirin, Atorvastatin, Clarithromycin, Crestor  [  rosuvastatin calcium], Durezol [difluprednate], Olopatadine hcl, Ondansetron, Penicillins, Pitavastatin, Statins, Chlorthalidone, Crestor [rosuvastatin], Other, Prednisone, Adhesive [tape], and Zofran [ondansetron hcl]   Social History   Socioeconomic History   Marital status: Divorced    Spouse name: Not on file   Number of children: 2   Years of education: Not on file   Highest education level: Not on file  Occupational History   Occupation: RESEARCH ASSIST    Employer: [employer]    Comment: Retired   Tobacco Use   Smoking status: Former    Packs/day: 1.00    Years: 27.00    Total pack years: 27.00    Types: Cigarettes    Quit date: 12/10/1983    Years since quitting: 38.3   Smokeless tobacco: Never  Vaping Use   Vaping Use: Never used  Substance and Sexual Activity   Alcohol use: Not Currently    Comment: 01/13/2016 "I'll have a drink < 2 times/month"   Drug use: No   Sexual activity: Never    Birth control/protection: Post-menopausal  Other Topics Concern   Not on file  Social History Narrative   Daily caffeine    Social Determinants of Health   Financial  Resource Strain: Not on file  Food Insecurity: Not on file  Transportation Needs: Not on file  Physical Activity: Not on file  Stress: Not on file  Social Connections: Not on file     Family History: The patient's family history includes Allergies in her mother; Heart disease in her father. There is no history of Colon cancer, Rectal cancer, Esophageal cancer, or Stomach cancer.  ROS:   Please see the history of present illness.  All other systems reviewed and are negative.  Labs/Other Studies Reviewed:    The following studies were reviewed today:  Echo 06/18/19  1. Left ventricular ejection fraction, by visual estimation, is 60 to  65%. The left ventricle has normal function. Normal left ventricular size.  There is mildly increased left ventricular hypertrophy.   2. Left ventricular diastolic Doppler parameters are indeterminate  pattern of LV diastolic filling.   3. Global right ventricle has normal systolic function.The right  ventricular size is normal. No increase in right ventricular wall  thickness.   4. Left atrial size was normal.   5. Right atrial size was normal.   6. The mitral valve is normal in structure. No evidence of mitral valve  regurgitation.   7. The tricuspid valve is normal in structure. Tricuspid valve  regurgitation was not visualized by color flow Doppler.   8. The aortic valve is tricuspid Aortic valve regurgitation is mild by  color flow Doppler.   9. The pulmonic valve was not well visualized. Pulmonic valve  regurgitation is not visualized by color flow Doppler.  10. The inferior vena cava is normal in size with greater than 50%  respiratory variability, suggesting right atrial pressure of 3 mmHg.   LHC 04/22/14   IMPRESSIONS:  1. Normal coronary arteries 2. Normal left ventricular systolic function with EF 60% and normal hemodynamics 3. Dyspnea not likely related to cardiac etiology     Nuclear stress test 03/2014  Suggestive of  ischemia.  Recommendation for left heart cath.  Recent Labs: 04/22/2022: BUN 11; Creatinine, Ser 0.70; Potassium 4.6; Sodium 138  Recent Lipid Panel    Component Value Date/Time   CHOL 145 08/21/2019 0833   TRIG 174 (H) 08/21/2019 0833   HDL 54 08/21/2019 0833   CHOLHDL 2.7 08/21/2019 6203  CHOLHDL 4 07/15/2014 0835   VLDL 25.2 07/15/2014 0835   LDLCALC 62 08/21/2019 0833   LDLDIRECT 206.1 08/01/2007 0917     Risk Assessment/Calculations:       Physical Exam:    VS:  BP 134/64   Pulse 70   Ht '5\' 3"'$  (1.6 m)   Wt 155 lb (70.3 kg)   SpO2 97%   BMI 27.46 kg/m     Wt Readings from Last 3 Encounters:  04/22/22 155 lb (70.3 kg)  04/22/22 155 lb 4 oz (70.4 kg)  04/21/21 157 lb 6.4 oz (71.4 kg)     GEN:  Well nourished, well developed in no acute distress HEENT: Normal NECK: No JVD; No carotid bruits CARDIAC: RRR, no murmurs, rubs, gallops RESPIRATORY:  Clear to auscultation without rales, wheezing or rhonchi  ABDOMEN: Soft, non-tender, non-distended MUSCULOSKELETAL:  No edema; No deformity. 2+ pedal pulses, equal bilaterally SKIN: Warm and dry NEUROLOGIC:  Alert and oriented x 3 PSYCHIATRIC:  Normal affect   EKG:  EKG is ordered today.  The ekg ordered today demonstrates NSR at 70 bpm, low voltage QRS, anterolateral TWI, no acute change from previous tracing   Diagnoses:    1. Essential hypertension   2. Mixed hyperlipidemia   3. Medication management   4. Irregular heart rhythm    Assessment and Plan:     Suspected atrial fib: Here today for concern regarding recent notifications from [place] of a fib. Tracings revealed NSR, confirmed with Dr. Curt Bears. EKG today also reveals NSR.  Currently under a great deal of stress due to recent death of one of her daughters.  Advised her to notify us with concerns prior to next office visit  Hypertension: BP is well-controlled.  No medication changes today.  Hyperlipidemia: LDL 59, Apolipoprotein a 157, apolipo B 73  on 11/2020.  Not specifically addressed today.  Continue Praluent.     Disposition: Follow-up with Dr. Johney Frame in 2-3 months  Medication Adjustments/Labs and Tests Ordered: Current medicines are reviewed at length with the patient today.  Concerns regarding medicines are outlined above.  Orders Placed This Encounter  Procedures   EKG 12-Lead   No orders of the defined types were placed in this encounter.   Patient Instructions  Medication Instructions:   Your physician recommends that you continue on your current medications as directed. Please refer to the Current Medication list given to you today.   *If you need a refill on your cardiac medications before your next appointment, please call your pharmacy*   Lab Work:  None ordered.  If you have labs (blood work) drawn today and your tests are completely normal, you will receive your results only by: [place] (if you have MyChart) OR A paper copy in the mail If you have any lab test that is abnormal or we need to change your treatment, we will call you to review the results.   Testing/Procedures:  None ordered.   Follow-Up: At [facility], you and your health needs are our priority.  As part of our continuing mission to provide you with exceptional heart care, we have created designated Provider Care Teams.  These Care Teams include your primary Cardiologist (physician) and Advanced Practice Providers (APPs -  Physician Assistants and Nurse Practitioners) who all work together to provide you with the care you need, when you need it.  We recommend signing up for the patient portal called "MyChart".  Sign up information is provided on this After Visit Summary.  MyChart is used to connect with patients for Virtual Visits (Telemedicine).  Patients are able to view lab/test results, encounter notes, upcoming appointments, etc.  Non-urgent messages can be sent to your provider as well.   To learn more about what you can  do with MyChart, go to [email or web address].    Your next appointment:   2 month(s)  The format for your next appointment:   In Person  Provider:   Dr. Gwyndolyn Kaufman  If primary card or EP is not listed click here to update    :1}    Important Information About Sugar         Signed, Jhalen Eley, Lanice Schwab, NP  04/22/2022 3:24 PM    [place]

## 2022-04-22 ENCOUNTER — Other Ambulatory Visit (INDEPENDENT_AMBULATORY_CARE_PROVIDER_SITE_OTHER): Payer: Medicare Other

## 2022-04-22 ENCOUNTER — Encounter: Payer: Self-pay | Admitting: Internal Medicine

## 2022-04-22 ENCOUNTER — Ambulatory Visit (INDEPENDENT_AMBULATORY_CARE_PROVIDER_SITE_OTHER): Payer: Medicare Other | Admitting: Nurse Practitioner

## 2022-04-22 ENCOUNTER — Ambulatory Visit (INDEPENDENT_AMBULATORY_CARE_PROVIDER_SITE_OTHER): Payer: Medicare Other | Admitting: Internal Medicine

## 2022-04-22 ENCOUNTER — Encounter: Payer: Self-pay | Admitting: Nurse Practitioner

## 2022-04-22 VITALS — BP 134/64 | HR 70 | Ht 63.0 in | Wt 155.0 lb

## 2022-04-22 VITALS — BP 146/84 | HR 65 | Ht 63.0 in | Wt 155.2 lb

## 2022-04-22 DIAGNOSIS — E782 Mixed hyperlipidemia: Secondary | ICD-10-CM

## 2022-04-22 DIAGNOSIS — I499 Cardiac arrhythmia, unspecified: Secondary | ICD-10-CM | POA: Diagnosis not present

## 2022-04-22 DIAGNOSIS — R194 Change in bowel habit: Secondary | ICD-10-CM | POA: Diagnosis not present

## 2022-04-22 DIAGNOSIS — Z79899 Other long term (current) drug therapy: Secondary | ICD-10-CM

## 2022-04-22 DIAGNOSIS — R109 Unspecified abdominal pain: Secondary | ICD-10-CM

## 2022-04-22 DIAGNOSIS — I10 Essential (primary) hypertension: Secondary | ICD-10-CM

## 2022-04-22 DIAGNOSIS — Z86010 Personal history of colonic polyps: Secondary | ICD-10-CM | POA: Diagnosis not present

## 2022-04-22 DIAGNOSIS — K5730 Diverticulosis of large intestine without perforation or abscess without bleeding: Secondary | ICD-10-CM | POA: Diagnosis not present

## 2022-04-22 LAB — BASIC METABOLIC PANEL
BUN: 11 mg/dL (ref 6–23)
CO2: 28 mEq/L (ref 19–32)
Calcium: 9.7 mg/dL (ref 8.4–10.5)
Chloride: 104 mEq/L (ref 96–112)
Creatinine, Ser: 0.7 mg/dL (ref 0.40–1.20)
GFR: 80.82 mL/min (ref >60.00)
Glucose, Bld: 108 mg/dL — ABNORMAL HIGH (ref 70–99)
Potassium: 4.6 mEq/L (ref 3.5–5.1)
Sodium: 138 mEq/L (ref 135–145)

## 2022-04-22 MED ORDER — HYOSCYAMINE SULFATE 0.125 MG SL SUBL: 0.1250 mg | SUBLINGUAL_TABLET | SUBLINGUAL | 6 refills | Status: DC | PRN

## 2022-04-22 NOTE — Patient Instructions (Signed)
Medication Instructions:   Your physician recommends that you continue on your current medications as directed. Please refer to the Current Medication list given to you today.   *If you need a refill on your cardiac medications before your next appointment, please call your pharmacy*   Lab Work:  None ordered.  If you have labs (blood work) drawn today and your tests are completely normal, you will receive your results only by: [place] (if you have MyChart) OR A paper copy in the mail If you have any lab test that is abnormal or we need to change your treatment, we will call you to review the results.   Testing/Procedures:  None ordered.   Follow-Up: At [facility], you and your health needs are our priority.  As part of our continuing mission to provide you with exceptional heart care, we have created designated Provider Care Teams.  These Care Teams include your primary Cardiologist (physician) and Advanced Practice Providers (APPs -  Physician Assistants and Nurse Practitioners) who all work together to provide you with the care you need, when you need it.  We recommend signing up for the patient portal called "MyChart".  Sign up information is provided on this After Visit Summary.  MyChart is used to connect with patients for Virtual Visits (Telemedicine).  Patients are able to view lab/test results, encounter notes, upcoming appointments, etc.  Non-urgent messages can be sent to your provider as well.   To learn more about what you can do with MyChart, go to [email or web address].    Your next appointment:   2 month(s)  The format for your next appointment:   In Person  Provider:   Dr. Gwyndolyn Kaufman  If primary card or EP is not listed click here to update    :1}    Important Information About [place]

## 2022-04-22 NOTE — Patient Instructions (Addendum)
_______________________________________________________  If you are age 82 or older, your body mass index should be between 23-30. Your Body mass index is 27.5 kg/m. If this is out of the aforementioned range listed, please consider follow up with your Primary Care Provider.  If you are age 42 or younger, your body mass index should be between 19-25. Your Body mass index is 27.5 kg/m. If this is out of the aformentioned range listed, please consider follow up with your Primary Care Provider.   ________________________________________________________  The [facility] providers would like to encourage you to use [facility] to communicate with providers for non-urgent requests or questions.  Due to long hold times on the telephone, sending your provider a message by [facility] may be a faster and more efficient way to get a response.  Please allow 48 business hours for a response.  Please remember that this is for non-urgent requests.  _______________________________________________________  Your provider has requested that you go to the basement level for lab work before leaving today. Press "B" on the elevator. The lab is located at the first door on the left as you exit the elevator.  We have sent the following medications to your pharmacy for you to pick up at your convenience:  Levsin  You have been scheduled for a CT scan of the abdomen and pelvis at [facility], 1st floor Radiology. You are scheduled on 05/05/2022 at 9:30am. You should arrive 30 minutes prior to your appointment time for registration.  We are giving you 2 bottles of contrast today that you will need to drink before arriving for the exam. The solution may taste better if refrigerated so put them in the refrigerator when you get home, but do NOT add ice or any other liquid to this solution as that would dilute it. Shake well before drinking.   Please follow the written instructions below on the day of your exam:   1) Do not  eat anything after 5:30am (4 hours prior to your test)   2) Drink 1 bottle of contrast @ 7:30am (2 hours prior to your exam)  Remember to shake well before drinking and do NOT pour over ice.     Drink 1 bottle of contrast @ 8:30am (1 hour prior to your exam)   You may take any medications as prescribed with a small amount of water, if necessary. If you take any of the following medications: METFORMIN, GLUCOPHAGE, GLUCOVANCE, AVANDAMET, RIOMET, FORTAMET, [place] MET, JANUMET, GLUMETZA or METAGLIP, you MAY be asked to HOLD this medication 48 hours AFTER the exam.   The purpose of you drinking the oral contrast is to aid in the visualization of your intestinal tract. The contrast solution may cause some diarrhea. Depending on your individual set of symptoms, you may also receive an intravenous injection of x-ray contrast/dye. Plan on being at [facility] for 45 minutes or longer, depending on the type of exam you are having performed.   If you have any questions regarding your exam or if you need to reschedule, you may call [facility] at [phone] between the hours of 8:00 am and 5:00 pm, Monday-Friday.   Take 1-2 tablespoons of over the counter Citrucel in 12-14 ounces of water or juice daily.

## 2022-04-22 NOTE — Progress Notes (Signed)
HISTORY OF PRESENT ILLNESS:  Marilyn Rivas is a 81 y.o. female with past medical history as listed below who presents today with her daughter regarding several month history of issues with her bowel habits and abdominal discomfort.  Patient does have a history of nonadvanced adenomatous colon polyps with prior colonoscopy in 2006, 2012, and 2018.  She is also known to have pandiverticulosis.  Prior upper endoscopy in July 2014 was normal.  She underwent empiric esophageal dilation for complaints of dysphagia.  Patient has not been seen in over 5 years.  She reports that over the past several months she has had times where her bowels felt like they were not evacuating well.  She might get cramping and abdominal fullness.  This occurs about every 2 weeks and last for about a week.  She has questions regarding her diet.  Still unusual for her to have episodes of urgency with diarrhea.  The discomfort is in the lower abdomen, often on the left.  She does have a history of diverticulitis remotely.  She does have some form of bowel movement every day.  No upper GI complaints.  No weight loss.  No bleeding.  She has an old prescription for sublingual Levsin, which she thinks helps.  She is concerned as this complaint is different than issues she has had with her abdomen in the past.  Review of CT scan from May 2019 revealed extensive diverticulosis without diverticulitis.  Patient has been sad after the recent passing of her daughter  REVIEW OF SYSTEMS:  All non-GI ROS negative unless otherwise stated in the HPI except for arthritis  Past Medical History:  Diagnosis Date   Anxiety    Arthritis    "left; maybe back" (01/13/2016)   Cancer ([place])    skin pre-cancer   Closed fracture of left distal radius    Depression    Diverticulitis    Diverticulosis    Family history of adverse reaction to anesthesia    "daughter gets really nauseous & has trouble getting intubated" (01/13/2016)   GERD  (gastroesophageal reflux disease)    HISTORY   History of blood transfusion 1977   "related to OR"   Hx of adenomatous colonic polyps    Hyperlipidemia    Hypertension    Hypothyroidism    Lichen sclerosus 0/9233   Biopsy proven   Menopause    Mild aortic insufficiency    Osteopenia    Seasonal allergies     Past Surgical History:  Procedure Laterality Date   ACHILLES TENDON SURGERY Right 04/25/2016   Procedure: ACHILLES TENDON REPAIR,PARTIAL EXCISION CALCANEOUS;  Surgeon: [name], MD;  Location: [place];  Service: Orthopedics;  Laterality: Right;   APPENDECTOMY  1977   BACK SURGERY     BLEPHAROPLASTY Bilateral    CARDIAC CATHETERIZATION  ~ 2014   CARPAL TUNNEL RELEASE Right    CATARACT EXTRACTION W/ INTRAOCULAR LENS  IMPLANT, BILATERAL Bilateral    CERVICAL FUSION  1999   C5-7   DILATATION & CURETTAGE/HYSTEROSCOPY WITH TRUECLEAR N/A 05/02/2013   Procedure: DILATATION & CURETTAGE/HYSTEROSCOPY WITH TRUECLEAR ;  Surgeon: Anastasio Auerbach, MD;  Location: [facility];  Service: Gynecology;  Laterality: N/A;   DILATION AND CURETTAGE OF UTERUS     HARDWARE REMOVAL Left 12/12/2013   Procedure:  LEFT PATELLA HARDWARE REMOVAL;  Surgeon: [name], MD;  Location: [place];  Service: Orthopedics;  Laterality: Left;   HYSTEROSCOPY     JOINT REPLACEMENT  KNEE ARTHROSCOPY Left 12/12/2013   Procedure: LEFT PATELLA ARTHROSCOPY KNEE WITH DEBRIDEMENT/SHAVING (CONDROPLASTY), LYSIS OF ADHESIONS;  Surgeon: [name], MD;  Location: [place];  Service: Orthopedics;  Laterality: Left;   LEFT HEART CATHETERIZATION WITH CORONARY ANGIOGRAM N/A 04/22/2014   Procedure: LEFT HEART CATHETERIZATION WITH CORONARY ANGIOGRAM;  Surgeon: Sinclair Grooms, MD;  Location: [facility] CATH LAB;  Service: Cardiovascular;  Laterality: N/A;   LUMBAR DISC SURGERY  2004   L5   OPEN REDUCTION INTERNAL FIXATION (ORIF) DISTAL RADIAL FRACTURE Left 05/07/2020    Procedure: OPEN REDUCTION INTERNAL FIXATION (ORIF) DISTAL RADIAL FRACTURE;  Surgeon: Hiram Gash, MD;  Location: [place];  Service: Orthopedics;  Laterality: Left;  block in preop   PARTIAL KNEE ARTHROPLASTY Left 01/13/2016   Procedure: LEFT UNICOMPARTMENTAL KNEE;  Surgeon: [name], MD;  Location: [place];  Service: Orthopedics;  Laterality: Left;   PATELLA RECONSTRUCTION Left 1994   REPLACEMENT UNICONDYLAR JOINT KNEE Left 01/13/2016   RIGHT OOPHORECTOMY  1978   TONSILLECTOMY      Social History Marilyn Rivas  reports that she quit smoking about 38 years ago. Her smoking use included cigarettes. She has a 27.00 pack-year smoking history. She has never used smokeless tobacco. She reports that she does not currently use alcohol. She reports that she does not use drugs.  family history includes Allergies in her mother; Heart disease in her father.  Allergies  Allergen Reactions   Aleve [Naproxen Sodium] Other (See Comments)    Other reaction(s): Other Severe abdominal pain   Aspirin Other (See Comments)    Other reaction(s): Other severe abdominal pain and excessive salavation    Atorvastatin Other (See Comments)    Other reaction(s): Myalgias (Muscle Pain) myalgia   Clarithromycin Rash, Other (See Comments) and Diarrhea    Says allergic to "mycins"   Crestor  [Rosuvastatin Calcium]     Other reaction(s): Other   Durezol [Difluprednate] Other (See Comments)    Other reaction(s): Eye Redness Eye redness     Olopatadine Hcl     Other reaction(s): Eye Redness   Ondansetron     Other reaction(s): Other, headache   Penicillins Swelling, Rash, Other (See Comments) and Itching    Hands swelling Has patient had a PCN reaction causing immediate rash, facial/tongue/throat swelling, SOB or lightheadedness with hypotension: yes Has patient had a PCN reaction causing severe rash involving mucus membranes or skin necrosis: no Has patient had a PCN reaction  that required hospitalization no Has patient had a PCN reaction occurring within the last 10 years: no If all of the above answers are "NO", then may proceed with Cephalosporin use.     Pitavastatin     Other reaction(s): Arthralgia (Joint Pain)   Statins Other (See Comments)    Other reaction(s): Other (See Comments) MUSCLE CRAMPS Muscle cramps   Chlorthalidone Other (See Comments)    Pt reports causes extreme dry mouth and throat   Crestor [Rosuvastatin] Other (See Comments)    Muscle aches   Other Other (See Comments)    [place] eye drops - causes eye redness   Prednisone     headache   Adhesive [Tape] Rash and Other (See Comments)    Looks burned   Zofran [Ondansetron Hcl] Other (See Comments)    Headache        PHYSICAL EXAMINATION: Vital signs: BP (!) 146/84   Pulse 65   Ht '5\' 3"'$  (1.6 m)   Wt 155  lb 4 oz (70.4 kg)   SpO2 97%   BMI 27.50 kg/m   Constitutional: generally well-appearing, no acute distress Psychiatric: alert and oriented x3, cooperative Eyes: extraocular movements intact, anicteric, conjunctiva pink Mouth: oral pharynx moist, no lesions Neck: supple no lymphadenopathy Cardiovascular: heart regular rate and rhythm, no murmur Lungs: clear to auscultation bilaterally Abdomen: soft, nontender, nondistended, no obvious ascites, no peritoneal signs, normal bowel sounds, no organomegaly Rectal: Omitted Extremities: no clubbing, cyanosis, or lower extremity edema bilaterally Skin: no lesions on visible extremities Neuro: No focal deficits.  Cranial nerves intact  ASSESSMENT:  1.  Change in bowel habits with sensation of difficulty with evacuation intermittent periods of urgency with diarrhea. 2.  Lower abdominal discomfort.  Per patient, different.  History of diverticular disease.  Rule out diverticular spasm.  Rule out other process 3.  Diverticulosis with a history of diverticulitis 4.  History of adenomatous colon polyps.  Aged out of  surveillance 5.  Prior EGD 2014 unremarkable   PLAN:  1.  Schedule contrast-enhanced CT scan of the abdomen pelvis to evaluate new onset persistent recurrent abdominal pain. 2.  Recommend Citrucel 1 to 2 tablespoons daily to improve bowel consistency 3.  Prescribe Levsin sublingual 0.125 mg as needed. 4.  Basic metabolic panel pre-CT 5.  We will contact the patient after the results of the above available 6.  Office follow-up 2 months.  Contact the office in the interim for questions or problems A total time of 45 minutes was spent preparing to see the patient, obtaining comprehensive history, performing medically appropriate physical examination, counseling and educating the patient as well as her daughter regarding the above listed issues, ordering blood work, medication, and advanced radiology.  Finally, documenting clinical information in the health record

## 2022-04-28 ENCOUNTER — Ambulatory Visit: Payer: Medicare Other | Admitting: Cardiology

## 2022-05-05 ENCOUNTER — Ambulatory Visit (HOSPITAL_COMMUNITY)
Admission: RE | Admit: 2022-05-05 | Discharge: 2022-05-05 | Disposition: A | Discharge status: Home / Self Care | Payer: Medicare Other | Source: Ambulatory Visit | Attending: Internal Medicine | Admitting: Internal Medicine

## 2022-05-05 DIAGNOSIS — R194 Change in bowel habit: Secondary | ICD-10-CM | POA: Insufficient documentation

## 2022-05-05 DIAGNOSIS — R109 Unspecified abdominal pain: Secondary | ICD-10-CM | POA: Diagnosis not present

## 2022-05-05 MED ORDER — SODIUM CHLORIDE (PF) 0.9 % IJ SOLN
INTRAMUSCULAR | Status: AC
  Filled 2022-05-05: qty 50

## 2022-05-05 MED ORDER — IOHEXOL 300 MG/ML  SOLN
100.0000 mL | Freq: Once | INTRAMUSCULAR | Status: AC | PRN
  Administered 2022-05-05: 100 mL via INTRAVENOUS

## 2022-06-24 NOTE — Progress Notes (Unsigned)
Cardiology Office Note:    Date:  06/24/2022   ID:  Marilyn Rivas, DOB 1941/07/07, MRN [id number]  PCP:  Willey Blade, MD   [facility] Providers Cardiologist:  Ena Dawley, MD {    Referring MD: Willey Blade, MD    History of Present Illness:    Marilyn Rivas is a 81 y.o. female with a hx of mild AI, anxiety, arthritis, depression, diverticulosis, GERD, hypothyroidism, HLD with statin intolerance - on PCSK9 therapy, lichen sclerosis, and former tobacco abuse who was previously followed by Dr. Meda Coffee who now returns to clinic for follow-up of her HLD.  Was seen on 04/21/2021 by Dr. Irish Lack for an acute visit.  She called in stating she was very short of breath with any slight exertion.  She reported on the day prior to the visit she got really dizzy, started seeing spots, got nauseated, short of breath and felt presyncopal.  She had to sit down, drink lots of water and then was able to recover.  Reported she was unsure if she was having palpitations or fluttering. No chest pain. Reported frequent bowel movements and that her urine smelled like vinegar.  Was no evidence of volume overload.  Her electrolytes, renal function, and CBC were all within normal limits.  TSH was low indicating hyperthyroidism which may have caused her frequent sweating spells. She was advised to follow-up with PCP.   She called our office 04/20/22 to report that her Apple watch reported A Fib. Her sister recently passed away which is causing her significant stress.  She denied palpitations, dizziness, shortness of breath or chest pain. She was seen by Christen Bame on 04/22/22 where she was reassuringly in rhythm and apple watch readings also with NSR.   Today, ***   Past Medical History:  Diagnosis Date   Anxiety    Arthritis    "left; maybe back" (01/13/2016)   Cancer ([place])    skin pre-cancer   Closed fracture of left distal radius    Depression    Diverticulitis     Diverticulosis    Family history of adverse reaction to anesthesia    "daughter gets really nauseous & has trouble getting intubated" (01/13/2016)   GERD (gastroesophageal reflux disease)    HISTORY   History of blood transfusion 1977   "related to OR"   Hx of adenomatous colonic polyps    Hyperlipidemia    Hypertension    Hypothyroidism    Lichen sclerosus 10/7626   Biopsy proven   Menopause    Mild aortic insufficiency    Osteopenia    Seasonal allergies     Past Surgical History:  Procedure Laterality Date   ACHILLES TENDON SURGERY Right 04/25/2016   Procedure: ACHILLES TENDON REPAIR,PARTIAL EXCISION CALCANEOUS;  Surgeon: [name], MD;  Location: [place];  Service: Orthopedics;  Laterality: Right;   APPENDECTOMY  1977   BACK SURGERY     BLEPHAROPLASTY Bilateral    CARDIAC CATHETERIZATION  ~ 2014   CARPAL TUNNEL RELEASE Right    CATARACT EXTRACTION W/ INTRAOCULAR LENS  IMPLANT, BILATERAL Bilateral    CERVICAL FUSION  1999   C5-7   DILATATION & CURETTAGE/HYSTEROSCOPY WITH TRUECLEAR N/A 05/02/2013   Procedure: DILATATION & CURETTAGE/HYSTEROSCOPY WITH TRUECLEAR ;  Surgeon: Anastasio Auerbach, MD;  Location: [facility];  Service: Gynecology;  Laterality: N/A;   DILATION AND CURETTAGE OF UTERUS     HARDWARE REMOVAL Left 12/12/2013   Procedure:  LEFT PATELLA HARDWARE REMOVAL;  Surgeon: [name], MD;  Location: [place];  Service: Orthopedics;  Laterality: Left;   HYSTEROSCOPY     JOINT REPLACEMENT     KNEE ARTHROSCOPY Left 12/12/2013   Procedure: LEFT PATELLA ARTHROSCOPY KNEE WITH DEBRIDEMENT/SHAVING (CONDROPLASTY), LYSIS OF ADHESIONS;  Surgeon: [name], MD;  Location: [place];  Service: Orthopedics;  Laterality: Left;   LEFT HEART CATHETERIZATION WITH CORONARY ANGIOGRAM N/A 04/22/2014   Procedure: LEFT HEART CATHETERIZATION WITH CORONARY ANGIOGRAM;  Surgeon: Sinclair Grooms, MD;  Location: [facility] CATH LAB;  Service:  Cardiovascular;  Laterality: N/A;   LUMBAR DISC SURGERY  2004   L5   OPEN REDUCTION INTERNAL FIXATION (ORIF) DISTAL RADIAL FRACTURE Left 05/07/2020   Procedure: OPEN REDUCTION INTERNAL FIXATION (ORIF) DISTAL RADIAL FRACTURE;  Surgeon: Hiram Gash, MD;  Location: [place];  Service: Orthopedics;  Laterality: Left;  block in preop   PARTIAL KNEE ARTHROPLASTY Left 01/13/2016   Procedure: LEFT UNICOMPARTMENTAL KNEE;  Surgeon: [name], MD;  Location: [place];  Service: Orthopedics;  Laterality: Left;   PATELLA RECONSTRUCTION Left 1994   REPLACEMENT UNICONDYLAR JOINT KNEE Left 01/13/2016   RIGHT OOPHORECTOMY  1978   TONSILLECTOMY      Current Medications: No outpatient medications have been marked as taking for the 06/27/22 encounter (Appointment) with Freada Bergeron, MD.   Current Facility-Administered Medications for the 06/27/22 encounter (Appointment) with Freada Bergeron, MD  Medication   0.9 %  sodium chloride infusion     Allergies:   Aleve [naproxen sodium], Aspirin, Atorvastatin, Clarithromycin, Crestor  [rosuvastatin calcium], Durezol [difluprednate], Olopatadine hcl, Ondansetron, Penicillins, Pitavastatin, Statins, Chlorthalidone, Crestor [rosuvastatin], Other, Prednisone, Adhesive [tape], and Zofran [ondansetron hcl]   Social History   Socioeconomic History   Marital status: Divorced    Spouse name: Not on file   Number of children: 2   Years of education: Not on file   Highest education level: Not on file  Occupational History   Occupation: RESEARCH ASSIST    Employer: [employer]    Comment: Retired   Tobacco Use   Smoking status: Former    Packs/day: 1.00    Years: 27.00    Total pack years: 27.00    Types: Cigarettes    Quit date: 12/10/1983    Years since quitting: 38.5   Smokeless tobacco: Never  Vaping Use   Vaping Use: Never used  Substance and Sexual Activity   Alcohol use: Not Currently    Comment: 01/13/2016  "I'll have a drink < 2 times/month"   Drug use: No   Sexual activity: Never    Birth control/protection: Post-menopausal  Other Topics Concern   Not on file  Social History Narrative   Daily caffeine    Social Determinants of Health   Financial Resource Strain: Not on file  Food Insecurity: Not on file  Transportation Needs: Not on file  Physical Activity: Not on file  Stress: Not on file  Social Connections: Not on file     Family History: The patient's family history includes Allergies in her mother; Heart disease in her father. There is no history of Colon cancer, Rectal cancer, Esophageal cancer, or Stomach cancer.  ROS:   Please see the history of present illness.    Review of Systems  Constitutional:  Negative for chills and fever.  HENT:  Negative for congestion.   Eyes:  Negative for blurred vision.  Respiratory:  Negative for shortness of breath.  Cardiovascular:  Negative for chest pain, palpitations, orthopnea, claudication, leg swelling and PND.  Gastrointestinal:  Negative for nausea and vomiting.  Genitourinary:  Negative for hematuria.  Musculoskeletal:  Positive for joint pain.  Neurological:  Negative for dizziness and loss of consciousness.  Psychiatric/Behavioral:  Positive for memory loss.     EKGs/Labs/Other Studies Reviewed:    The following studies were reviewed today: TTE 07/03/19: IMPRESSIONS   1. Left ventricular ejection fraction, by visual estimation, is 60 to  65%. The left ventricle has normal function. Normal left ventricular size.  There is mildly increased left ventricular hypertrophy.   2. Left ventricular diastolic Doppler parameters are indeterminate  pattern of LV diastolic filling.   3. Global right ventricle has normal systolic function.The right  ventricular size is normal. No increase in right ventricular wall  thickness.   4. Left atrial size was normal.   5. Right atrial size was normal.   6. The mitral valve is normal in  structure. No evidence of mitral valve  regurgitation.   7. The tricuspid valve is normal in structure. Tricuspid valve  regurgitation was not visualized by color flow Doppler.   8. The aortic valve is tricuspid Aortic valve regurgitation is mild by  color flow Doppler.   9. The pulmonic valve was not well visualized. Pulmonic valve  regurgitation is not visualized by color flow Doppler.  10. The inferior vena cava is normal in size with greater than 50%  respiratory variability, suggesting right atrial pressure of 3 mmHg.  EKG:  No new tracing today  Recent Labs: 04/22/2022: BUN 11; Creatinine, Ser 0.70; Potassium 4.6; Sodium 138  Recent Lipid Panel    Component Value Date/Time   CHOL 145 08/21/2019 0833   TRIG 174 (H) 08/21/2019 0833   HDL 54 08/21/2019 0833   CHOLHDL 2.7 08/21/2019 0833   CHOLHDL 4 07/15/2014 0835   VLDL 25.2 07/15/2014 0835   LDLCALC 62 08/21/2019 0833   LDLDIRECT 206.1 08/01/2007 0917      Physical Exam:    VS:  There were no vitals taken for this visit.    Wt Readings from Last 3 Encounters:  04/22/22 155 lb (70.3 kg)  04/22/22 155 lb 4 oz (70.4 kg)  04/21/21 157 lb 6.4 oz (71.4 kg)     GEN:  Well nourished, well developed in no acute distress HEENT: Normal NECK: No JVD; No carotid bruits CARDIAC: RRR, no murmurs, rubs, gallops RESPIRATORY:  Clear to auscultation without rales, wheezing or rhonchi  ABDOMEN: Soft, non-tender, non-distended MUSCULOSKELETAL:  No edema; No deformity  SKIN: Warm and dry NEUROLOGIC:  Alert and oriented x 3 PSYCHIATRIC:  Normal affect   ASSESSMENT:    No diagnosis found.  PLAN:    In order of problems listed above:  #HTN: Controlled off medications. -Continue lifestyle modifications and low Na diet  #HLD: Intolerant of statins. On PCSK9i. Lipids checked by PCP; will obtained records. -Continue praulent '75mg'$  q14d -Obtain recent cholesterol panel from PCP  #Hypothyroidism: -Continue synthroid 26mg  daily  #Bilateral Leg Cramps: Occurring at night. No claudication symptoms. Resumed potassium supplemenation. -Take Mag at night -Diet tonic water -Increase hydration -Continue B complex vitamins -Compression socks  #Hypokalemia: Well controlled. -Continue potassium supplementation 285m daily -Not on diuretics -Follow-up labs with PCP    Medication Adjustments/Labs and Tests Ordered: Current medicines are reviewed at length with the patient today.  Concerns regarding medicines are outlined above.  No orders of the defined types were placed in this encounter.  No orders of the defined types were placed in this encounter.   There are no Patient Instructions on file for this visit.    Signed, Freada Bergeron, MD  06/24/2022 8:02 PM    [place]

## 2022-06-27 ENCOUNTER — Ambulatory Visit: Payer: Medicare Other | Attending: Cardiology | Admitting: Cardiology

## 2022-06-27 ENCOUNTER — Encounter: Payer: Self-pay | Admitting: Cardiology

## 2022-06-27 VITALS — BP 122/82 | HR 82 | Ht 60.0 in | Wt 158.4 lb

## 2022-06-27 DIAGNOSIS — I499 Cardiac arrhythmia, unspecified: Secondary | ICD-10-CM | POA: Diagnosis not present

## 2022-06-27 DIAGNOSIS — R61 Generalized hyperhidrosis: Secondary | ICD-10-CM | POA: Diagnosis present

## 2022-06-27 DIAGNOSIS — R0609 Other forms of dyspnea: Secondary | ICD-10-CM | POA: Insufficient documentation

## 2022-06-27 DIAGNOSIS — I10 Essential (primary) hypertension: Secondary | ICD-10-CM | POA: Diagnosis not present

## 2022-06-27 DIAGNOSIS — R42 Dizziness and giddiness: Secondary | ICD-10-CM | POA: Diagnosis not present

## 2022-06-27 DIAGNOSIS — E782 Mixed hyperlipidemia: Secondary | ICD-10-CM | POA: Insufficient documentation

## 2022-06-27 DIAGNOSIS — E039 Hypothyroidism, unspecified: Secondary | ICD-10-CM | POA: Insufficient documentation

## 2022-06-27 NOTE — Progress Notes (Signed)
Cardiology Office Note:    Date:  06/27/2022   ID:  Marilyn Rivas, DOB 1941-02-01, MRN [id number]  PCP:  Willey Blade, MD   [facility] Providers Cardiologist:  Ena Dawley, MD {    Referring MD: Willey Blade, MD    History of Present Illness:    Marilyn Rivas is a 81 y.o. female with a hx of mild AI, anxiety, arthritis, depression, diverticulosis, GERD, hypothyroidism, HLD with statin intolerance - on PCSK9 therapy, lichen sclerosis, and former tobacco abuse who was previously followed by Dr. Meda Coffee who now returns to clinic for follow-up of her HLD.  Was seen on 04/21/2021 by Dr. Irish Lack for an acute visit.  She called in stating she was very short of breath with any slight exertion.  She reported on the day prior to the visit she got really dizzy, started seeing spots, got nauseated, short of breath and felt presyncopal.  She had to sit down, drink lots of water and then was able to recover.  Reported she was unsure if she was having palpitations or fluttering. No chest pain. Reported frequent bowel movements and that her urine smelled like vinegar.  Was no evidence of volume overload.  Her electrolytes, renal function, and CBC were all within normal limits.  TSH was low indicating hyperthyroidism which may have caused her frequent sweating spells. She was advised to follow-up with PCP.   She called our office 04/20/22 to report that her Apple watch reported A Fib. Her sister recently passed away which is causing her significant stress.  She denied palpitations, dizziness, shortness of breath or chest pain. She was seen by Christen Bame on 04/22/22 where she was reassuringly in rhythm and apple watch readings also with NSR.   Today, the patient states that she is struggling with episodes of diaphoresis with onset a couple months ago. This occurs at least once a day, and sometimes 2 or 3 times in a day. Typically she beings sweating from the top of her head,  which progresses inferiorly. Her episodes may occur when she becomes upset, while she is sitting, or during activity. The other day she could hardly walk her dog for 30 minutes before becoming diaphoretic; this episode lasted another 30 minutes after returning home and sitting down to rest. She also complains of intermittent night sweats. At this time she denies other associated symptoms: no chest pain, shortness of breath, or palpitations.  Very rarely she may receive notifications from her smart watch that she is in atrial fibrillation. She usually stops and rests for a while until the notification is cleared.  Sometimes she feels a little lightheaded, usually after she bends over.  At home her blood pressures range 120-130/60-70. In clinic today her blood pressure is elevated at 142/62, but improved to 122/82 on recheck.  She denies any headaches, syncope, orthopnea, or PND.  Recently she had thyroid testing which we reviewed. She presents her patient portal showing TSH 8.5, T4 was 2. A subsequent increase to her Armour dose seemed to have no effect. Previously she was on synthroid, but this was stopped in favor of natural alternatives.   She notes that Aricept was discontinued due to causing nightmares.   Past Medical History:  Diagnosis Date   Anxiety    Arthritis    "left; maybe back" (01/13/2016)   Cancer ([place])    skin pre-cancer   Closed fracture of left distal radius    Depression    Diverticulitis    Diverticulosis  Family history of adverse reaction to anesthesia    "daughter gets really nauseous & has trouble getting intubated" (01/13/2016)   GERD (gastroesophageal reflux disease)    HISTORY   History of blood transfusion 1977   "related to OR"   Hx of adenomatous colonic polyps    Hyperlipidemia    Hypertension    Hypothyroidism    Lichen sclerosus 03/9210   Biopsy proven   Menopause    Mild aortic insufficiency    Osteopenia    Seasonal allergies     Past  Surgical History:  Procedure Laterality Date   ACHILLES TENDON SURGERY Right 04/25/2016   Procedure: ACHILLES TENDON REPAIR,PARTIAL EXCISION CALCANEOUS;  Surgeon: [name], MD;  Location: [place];  Service: Orthopedics;  Laterality: Right;   APPENDECTOMY  1977   BACK SURGERY     BLEPHAROPLASTY Bilateral    CARDIAC CATHETERIZATION  ~ 2014   CARPAL TUNNEL RELEASE Right    CATARACT EXTRACTION W/ INTRAOCULAR LENS  IMPLANT, BILATERAL Bilateral    CERVICAL FUSION  1999   C5-7   DILATATION & CURETTAGE/HYSTEROSCOPY WITH TRUECLEAR N/A 05/02/2013   Procedure: DILATATION & CURETTAGE/HYSTEROSCOPY WITH TRUECLEAR ;  Surgeon: Anastasio Auerbach, MD;  Location: [facility] [place] [facility] [facility];  Service: Gynecology;  Laterality: N/A;   DILATION AND CURETTAGE OF UTERUS     HARDWARE REMOVAL Left 12/12/2013   Procedure:  LEFT PATELLA HARDWARE REMOVAL;  Surgeon: [name], MD;  Location: [place];  Service: Orthopedics;  Laterality: Left;   HYSTEROSCOPY     JOINT REPLACEMENT     KNEE ARTHROSCOPY Left 12/12/2013   Procedure: LEFT PATELLA ARTHROSCOPY KNEE WITH DEBRIDEMENT/SHAVING (CONDROPLASTY), LYSIS OF ADHESIONS;  Surgeon: [name], MD;  Location: [place];  Service: Orthopedics;  Laterality: Left;   LEFT HEART CATHETERIZATION WITH CORONARY ANGIOGRAM N/A 04/22/2014   Procedure: LEFT HEART CATHETERIZATION WITH CORONARY ANGIOGRAM;  Surgeon: Sinclair Grooms, MD;  Location: [facility] C[facility];  Service: Cardiovascular;  Laterality: N/A;   LUMBAR DISC SURGERY  2004   L5   OPEN REDUCTION INTERNAL FIXATION (ORIF) DISTAL RADIAL FRACTURE Left 05/07/2020   Procedure: OPEN REDUCTION INTERNAL FIXATION (ORIF) DISTAL RADIAL FRACTURE;  Surgeon: Hiram Gash, MD;  Location: [place];  Service: Orthopedics;  Laterality: Left;  block in preop   PARTIAL KNEE ARTHROPLASTY Left 01/13/2016   Procedure: LEFT UNICOMPARTMENTAL KNEE;  Surgeon: [name], MD;  Location: [place];   Service: Orthopedics;  Laterality: Left;   PATELLA RECONSTRUCTION Left 1994   REPLACEMENT UNICONDYLAR JOINT KNEE Left 01/13/2016   RIGHT OOPHORECTOMY  1978   TONSILLECTOMY      Current Medications: Current Meds  Medication Sig   Alirocumab (PRALUENT) 75 MG/ML SOAJ Inject 75 mg into the skin every 14 (fourteen) days.   Biotin w/ Vitamins C & E (HAIR SKIN & NAILS GUMMIES PO) Take by mouth.   Cholecalciferol (VITAMIN D3) 5000 UNITS TABS Take 1 tablet by mouth every Monday, Wednesday, and Friday.    DULoxetine (CYMBALTA) 20 MG capsule Take 20 mg by mouth daily.   hyoscyamine (LEVSIN SL) 0.125 MG SL tablet Place 1 tablet (0.125 mg total) under the tongue every 4 (four) hours as needed.   Lifitegrast (XIIDRA OP) Apply to eye 2 (two) times daily. For dry eyes   Magnesium 200 MG TABS Take 200 mg by mouth. Per patient taking 2 tablets in the am and taking 1 tablet in the pm   potassium chloride (  KLOR-CON) 10 MEQ tablet Take 20 mEq by mouth daily.   Probiotic Product (PROBIOTIC PO) Take 1-2 capsules by mouth as needed (for GI support).    thyroid (ARMOUR) 90 MG tablet Take 90 mg by mouth daily.    Current Facility-Administered Medications for the 06/27/22 encounter (Office Visit) with Freada Bergeron, MD  Medication   0.9 %  sodium chloride infusion     Allergies:   Aleve [naproxen sodium], Aspirin, Atorvastatin, Clarithromycin, Crestor  [rosuvastatin calcium], Durezol [difluprednate], Olopatadine hcl, Ondansetron, Penicillins, Pitavastatin, Statins, Chlorthalidone, Crestor [rosuvastatin], Other, Prednisone, Adhesive [tape], and Zofran [ondansetron hcl]   Social History   Socioeconomic History   Marital status: Divorced    Spouse name: Not on file   Number of children: 2   Years of education: Not on file   Highest education level: Not on file  Occupational History   Occupation: RESEARCH ASSIST    Employer: [employer]    Comment: Retired   Tobacco Use   Smoking status:  Former    Packs/day: 1.00    Years: 27.00    Total pack years: 27.00    Types: Cigarettes    Quit date: 12/10/1983    Years since quitting: 38.5   Smokeless tobacco: Never  Vaping Use   Vaping Use: Never used  Substance and Sexual Activity   Alcohol use: Not Currently    Comment: 01/13/2016 "I'll have a drink < 2 times/month"   Drug use: No   Sexual activity: Never    Birth control/protection: Post-menopausal  Other Topics Concern   Not on file  Social History Narrative   Daily caffeine    Social Determinants of Health   Financial Resource Strain: Not on file  Food Insecurity: Not on file  Transportation Needs: Not on file  Physical Activity: Not on file  Stress: Not on file  Social Connections: Not on file     Family History: The patient's family history includes Allergies in her mother; Heart disease in her father. There is no history of Colon cancer, Rectal cancer, Esophageal cancer, or Stomach cancer.  ROS:   Please see the history of present illness.    Review of Systems  Constitutional:  Positive for diaphoresis. Negative for chills and fever.  HENT:  Negative for congestion.   Eyes:  Negative for blurred vision.  Respiratory:  Negative for shortness of breath.   Cardiovascular:  Negative for chest pain, palpitations, orthopnea, claudication, leg swelling and PND.  Gastrointestinal:  Negative for nausea and vomiting.  Genitourinary:  Negative for hematuria.  Musculoskeletal:  Positive for joint pain.  Neurological:  Positive for dizziness. Negative for loss of consciousness.  Psychiatric/Behavioral:  Positive for memory loss.     EKGs/Labs/Other Studies Reviewed:    The following studies were reviewed today:  TTE 05/2019: IMPRESSIONS   1. Left ventricular ejection fraction, by visual estimation, is 60 to  65%. The left ventricle has normal function. Normal left ventricular size.  There is mildly increased left ventricular hypertrophy.   2. Left  ventricular diastolic Doppler parameters are indeterminate  pattern of LV diastolic filling.   3. Global right ventricle has normal systolic function.The right  ventricular size is normal. No increase in right ventricular wall  thickness.   4. Left atrial size was normal.   5. Right atrial size was normal.   6. The mitral valve is normal in structure. No evidence of mitral valve  regurgitation.   7. The tricuspid valve is normal in structure.  Tricuspid valve  regurgitation was not visualized by color flow Doppler.   8. The aortic valve is tricuspid Aortic valve regurgitation is mild by  color flow Doppler.   9. The pulmonic valve was not well visualized. Pulmonic valve  regurgitation is not visualized by color flow Doppler.  10. The inferior vena cava is normal in size with greater than 50%  respiratory variability, suggesting right atrial pressure of 3 mmHg.  EKG:  EKG is personally reviewed. 06/27/2022:  EKG was not ordered. 04/22/2022 Christen Bame, NP):  NSR at 70 bpm, low voltage QRS, anterolateral TWI, no acute change from previous tracing  Recent Labs: 04/22/2022: BUN 11; Creatinine, Ser 0.70; Potassium 4.6; Sodium 138   Recent Lipid Panel    Component Value Date/Time   CHOL 145 08/21/2019 0833   TRIG 174 (H) 08/21/2019 0833   HDL 54 08/21/2019 0833   CHOLHDL 2.7 08/21/2019 0833   CHOLHDL 4 07/15/2014 0835   VLDL 25.2 07/15/2014 0835   LDLCALC 62 08/21/2019 0833   LDLDIRECT 206.1 08/01/2007 0917      Physical Exam:    VS:  BP 122/82 (BP Location: Right Arm, Patient Position: Sitting, Cuff Size: Normal)   Pulse 82   Ht 5' (1.524 m)   Wt 158 lb 6.4 oz (71.8 kg)   SpO2 94%   BMI 30.94 kg/m     Wt Readings from Last 3 Encounters:  06/27/22 158 lb 6.4 oz (71.8 kg)  04/22/22 155 lb (70.3 kg)  04/22/22 155 lb 4 oz (70.4 kg)     GEN:  Well nourished, well developed in no acute distress HEENT: Normal NECK: No JVD; No carotid bruits CARDIAC: RRR, no murmurs,  rubs, gallops RESPIRATORY:  Clear to auscultation without rales, wheezing or rhonchi  ABDOMEN: Soft, non-tender, non-distended MUSCULOSKELETAL:  No edema; No deformity  SKIN: Warm and dry NEUROLOGIC:  Alert and oriented x 3 PSYCHIATRIC:  Normal affect   ASSESSMENT:    1. Essential hypertension   2. Mixed hyperlipidemia   3. Irregular heart rhythm   4. Dizziness   5. DOE (dyspnea on exertion)   6. Hypothyroidism, unspecified type   7. Diaphoresis     PLAN:    In order of problems listed above:  #Diaphoresis: Unclear etiology. Has no associated cardiac symptoms. Following closely with PCP.  #HTN: Controlled off medications. -Continue lifestyle modifications and low Na diet  #HLD: Intolerant of statins. On PCSK9i. -Continue praulent '75mg'$  q14d -LDL controlled 62  #Hypothyroidism: -Continue armour per PCP  #Bilateral Leg Cramps: Occurring at night. No claudication symptoms. Resumed potassium supplemenation. -Take Mag at night -Increase hydration -Continue B complex vitamins -Compression socks  #Hypokalemia: Well controlled. -Continue potassium supplementation 36mq daily -Not on diuretics -BMET per PCP  Follow-up:  6 months.  Medication Adjustments/Labs and Tests Ordered: Current medicines are reviewed at length with the patient today.  Concerns regarding medicines are outlined above.   No orders of the defined types were placed in this encounter.  No orders of the defined types were placed in this encounter.  Patient Instructions  Medication Instructions:   Your physician recommends that you continue on your current medications as directed. Please refer to the Current Medication list given to you today.  *If you need a refill on your cardiac medications before your next appointment, please call your pharmacy*    Follow-Up: [facility] and your [facility] needs are our [facility].  As part of our continuing mission to provide you with  exceptional heart care, we have created designated Provider Care Teams.  These Care Teams include your primary Cardiologist (physician) and Advanced Practice Providers (APPs -  Physician Assistants and Nurse Practitioners) who all work together to provide you with the care you need, when you need it.  We recommend signing up for the patient portal called "MyChart".  Sign up information is provided on this After Visit Summary.  MyChart is used to connect with patients for Virtual Visits (Telemedicine).  Patients are able to view lab/test results, encounter notes, upcoming appointments, etc.  Non-urgent messages can be sent to your provider as well.   To learn more about what you can do with MyChart, go to [email or web address].    Your next appointment:   6 month(s)  The format for your next appointment:   In Person  Provider:   Dr. Johney Frame or an Extender in the office  Important Information About Sugar        I,Mathew Stumpf,acting as a scribe for Freada Bergeron, MD.,have documented all relevant documentation on the behalf of Freada Bergeron, MD,as directed by  Freada Bergeron, MD while in the presence of Freada Bergeron, MD.  I, Freada Bergeron, MD, have reviewed all documentation for this visit. The documentation on 06/27/22 for the exam, diagnosis, procedures, and orders are all accurate and complete.    Signed, Freada Bergeron, MD  06/27/2022 3:35 PM    [facility]

## 2022-06-27 NOTE — Patient Instructions (Signed)
Medication Instructions:   Your physician recommends that you continue on your current medications as directed. Please refer to the Current Medication list given to you today.  *If you need a refill on your cardiac medications before your next appointment, please call your pharmacy*    Follow-Up: At [facility], you and your health needs are our priority.  As part of our continuing mission to provide you with exceptional heart care, we have created designated Provider Care Teams.  These Care Teams include your primary Cardiologist (physician) and Advanced Practice Providers (APPs -  Physician Assistants and Nurse Practitioners) who all work together to provide you with the care you need, when you need it.  We recommend signing up for the patient portal called "MyChart".  Sign up information is provided on this After Visit Summary.  MyChart is used to connect with patients for Virtual Visits (Telemedicine).  Patients are able to view lab/test results, encounter notes, upcoming appointments, etc.  Non-urgent messages can be sent to your provider as well.   To learn more about what you can do with MyChart, go to [email or web address].    Your next appointment:   6 month(s)  The format for your next appointment:   In Person  Provider:   Dr. Johney Frame or an Extender in the office  Important Information About Sugar

## 2022-06-28 ENCOUNTER — Ambulatory Visit: Payer: Medicare Other | Admitting: Internal Medicine

## 2022-07-01 ENCOUNTER — Ambulatory Visit: Payer: Medicare Other | Attending: Cardiology

## 2022-07-01 ENCOUNTER — Telehealth: Payer: Self-pay | Admitting: Cardiology

## 2022-07-01 DIAGNOSIS — R002 Palpitations: Secondary | ICD-10-CM

## 2022-07-01 DIAGNOSIS — I499 Cardiac arrhythmia, unspecified: Secondary | ICD-10-CM

## 2022-07-01 NOTE — Telephone Encounter (Signed)
Patient states she a Dr. Johney Frame discussed her wearing a monitor.  She would like for the order to be placed for the monitor now, she thinks it's a good idea.

## 2022-07-01 NOTE — Progress Notes (Unsigned)
Enrolled patient for a 3 day Zio XT monitor to be mailed to patients home  

## 2022-07-01 NOTE — Telephone Encounter (Signed)
Aware forwarding to Dr. Johney Frame for approval. Aware it will be Monday, most likely, before return call on this matter. Patient verbalized understanding and agreeable to plan.

## 2022-07-01 NOTE — Telephone Encounter (Signed)
No problem. We can do either the 3 day zio or 7 day zio. Whichever duration she thinks will capture her events.

## 2022-07-01 NOTE — Addendum Note (Signed)
Addended by: Stanton Kidney on: 07/01/2022 05:06 PM   Modules accepted: Orders

## 2022-07-01 NOTE — Telephone Encounter (Signed)
Pt aware of MD recommendation. Went over some basic monitor information and sent some instructions via mychart. Advised to call back with any questions/issues when monitor comes in. Patient verbalized understanding and agreeable to plan.

## 2022-07-07 DIAGNOSIS — R002 Palpitations: Secondary | ICD-10-CM | POA: Diagnosis not present

## 2022-08-17 ENCOUNTER — Encounter: Payer: Self-pay | Admitting: Cardiology

## 2022-08-17 ENCOUNTER — Other Ambulatory Visit: Payer: Self-pay | Admitting: Pharmacist

## 2022-08-17 MED ORDER — PRALUENT 75 MG/ML ~~LOC~~ SOAJ: 75.0000 mg | SUBCUTANEOUS | 11 refills | Status: DC

## 2022-09-01 ENCOUNTER — Other Ambulatory Visit (HOSPITAL_COMMUNITY): Payer: Self-pay

## 2022-12-28 NOTE — Progress Notes (Unsigned)
Cardiology Office Note:    Date:  12/29/2022   ID:  Marilyn Rivas, DOB 1941-04-12, MRN [id number]  PCP:  Marilyn Devon, MD   [facility] Providers Cardiologist:  Tobias Alexander, MD {    Referring MD: Marilyn Devon, MD    History of Present Illness:    Marilyn Rivas is a 82 y.o. female with a hx of mild AI, anxiety, arthritis, depression, diverticulosis, GERD, hypothyroidism, HLD with statin intolerance - on PCSK9 therapy, lichen sclerosis, and former tobacco abuse who was previously followed by Dr. Delton Rivas who now returns to clinic for follow-up of her HLD.  Was seen on 04/21/2021 by Dr. Eldridge Dace for an acute visit.  She called in stating she was very short of breath with any slight exertion.  She reported on the day prior to the visit she got really dizzy, started seeing spots, got nauseated, short of breath and felt presyncopal.  She had to sit down, drink lots of water and then was able to recover.  Reported she was unsure if she was having palpitations or fluttering. No chest pain. Reported frequent bowel movements and that her urine smelled like vinegar.  Was no evidence of volume overload.  Her electrolytes, renal function, and CBC were all within normal limits.  TSH was low indicating hyperthyroidism which may have caused her frequent sweating spells. She was advised to follow-up with PCP.   She called our office 04/20/22 to report that her Apple watch reported A Fib. Her sister recently passed away which is causing her significant stress.  She denied palpitations, dizziness, shortness of breath or chest pain. She was seen by Eligha Bridegroom on 04/22/22 where she was reassuringly in rhythm and apple watch readings also with NSR.   Was last seen in clinic on 05/2022 where she was having episodes of diaphoresis and notifications of Afib on apple watch. Cardiac monitor with 4 brief runs of SVT but no Afib.  Otherwise, was stable from a CV standpoint.   Today, the  patient states she has been struggling with back pain but has had a steroid injection which has helped. She is otherwise doing well from a CV standpoint. No chest pain, SOB, orthopnea, PND or LE edema. Blood pressure is well controlled. Tolerating medications as prescribed.   Suffered with her loss of her daughter in August.   Past Medical History:  Diagnosis Date   Anxiety    Arthritis    "left; maybe back" (01/13/2016)   Cancer (HCC)    skin pre-cancer   Closed fracture of left distal radius    Depression    Diverticulitis    Diverticulosis    Family history of adverse reaction to anesthesia    "daughter gets really nauseous & has trouble getting intubated" (01/13/2016)   GERD (gastroesophageal reflux disease)    HISTORY   History of blood transfusion 1977   "related to OR"   Hx of adenomatous colonic polyps    Hyperlipidemia    Hypertension    Hypothyroidism    Lichen sclerosus 01/2014   Biopsy proven   Menopause    Mild aortic insufficiency    Osteopenia    Seasonal allergies     Past Surgical History:  Procedure Laterality Date   ACHILLES TENDON SURGERY Right 04/25/2016   Procedure: ACHILLES TENDON REPAIR,PARTIAL EXCISION CALCANEOUS;  Surgeon: Loreta Ave, MD;  Location: [facility];  Service: Orthopedics;  Laterality: Right;   APPENDECTOMY  1977   BACK SURGERY  BLEPHAROPLASTY Bilateral    CARDIAC CATHETERIZATION  ~ 2014   CARPAL TUNNEL RELEASE Right    CATARACT EXTRACTION W/ INTRAOCULAR LENS  IMPLANT, BILATERAL Bilateral    CERVICAL FUSION  1999   C5-7   DILATATION & CURETTAGE/HYSTEROSCOPY WITH TRUECLEAR N/A 05/02/2013   Procedure: DILATATION & CURETTAGE/HYSTEROSCOPY WITH TRUECLEAR ;  Surgeon: Dara Lords, MD;  Location: [facility];  Service: Gynecology;  Laterality: N/A;   DILATION AND CURETTAGE OF UTERUS     HARDWARE REMOVAL Left 12/12/2013   Procedure:  LEFT PATELLA HARDWARE REMOVAL;  Surgeon: Loreta Ave, MD;  Location: [facility];  Service: Orthopedics;  Laterality: Left;   HYSTEROSCOPY     JOINT REPLACEMENT     KNEE ARTHROSCOPY Left 12/12/2013   Procedure: LEFT PATELLA ARTHROSCOPY KNEE WITH DEBRIDEMENT/SHAVING (CONDROPLASTY), LYSIS OF ADHESIONS;  Surgeon: Loreta Ave, MD;  Location: [facility];  Service: Orthopedics;  Laterality: Left;   LEFT HEART CATHETERIZATION WITH CORONARY ANGIOGRAM N/A 04/22/2014   Procedure: LEFT HEART CATHETERIZATION WITH CORONARY ANGIOGRAM;  Surgeon: Lesleigh Noe, MD;  Location: [facility];  Service: Cardiovascular;  Laterality: N/A;   LUMBAR DISC SURGERY  2004   L5   OPEN REDUCTION INTERNAL FIXATION (ORIF) DISTAL RADIAL FRACTURE Left 05/07/2020   Procedure: OPEN REDUCTION INTERNAL FIXATION (ORIF) DISTAL RADIAL FRACTURE;  Surgeon: Bjorn Pippin, MD;  Location:  SURGERY CENTER;  Service: Orthopedics;  Laterality: Left;  block in preop   PARTIAL KNEE ARTHROPLASTY Left 01/13/2016   Procedure: LEFT UNICOMPARTMENTAL KNEE;  Surgeon: Loreta Ave, MD;  Location: [facility];  Service: Orthopedics;  Laterality: Left;   PATELLA RECONSTRUCTION Left 1994   REPLACEMENT UNICONDYLAR JOINT KNEE Left 01/13/2016   RIGHT OOPHORECTOMY  1978   TONSILLECTOMY      Current Medications: Current Meds  Medication Sig   Alirocumab (PRALUENT) 75 MG/ML SOAJ Inject 75 mg into the skin every 14 (fourteen) days.   Biotin w/ Vitamins C & E (HAIR SKIN & NAILS GUMMIES PO) Take by mouth.   Cholecalciferol (VITAMIN D3) 5000 UNITS TABS Take 1 tablet by mouth every Monday, Wednesday, and Friday.    DULoxetine (CYMBALTA) 20 MG capsule Take 20 mg by mouth daily.   hyoscyamine (LEVSIN SL) 0.125 MG SL tablet Place 1 tablet (0.125 mg total) under the tongue every 4 (four) hours as needed.   levothyroxine (SYNTHROID) 25 MCG tablet Take 25 mcg by mouth daily before breakfast.   Lifitegrast (XIIDRA OP) Apply to eye 2 (two) times daily. For dry eyes   Magnesium 200 MG TABS Take 200 mg by  mouth. Per patient taking 2 tablets in the am and taking 1 tablet in the pm   potassium chloride (KLOR-CON) 10 MEQ tablet Take 20 mEq by mouth daily.   Probiotic Product (PROBIOTIC PO) Take 1-2 capsules by mouth as needed (for GI support).    [DISCONTINUED] thyroid (ARMOUR) 90 MG tablet Take 90 mg by mouth daily.    Current Facility-Administered Medications for the 12/29/22 encounter (Office Visit) with Meriam Sprague, MD  Medication   0.9 %  sodium chloride infusion     Allergies:   Aleve [naproxen sodium], Aspirin, Atorvastatin, Clarithromycin, Crestor  [rosuvastatin calcium], Durezol [difluprednate], Olopatadine hcl, Ondansetron, Penicillins, Pitavastatin, Statins, Chlorthalidone, Crestor [rosuvastatin], Other, Prednisone, Adhesive [tape], and Zofran [ondansetron hcl]   Social History   Socioeconomic History   Marital status: Divorced    Spouse name: Not on file   Number of children: 2  Years of education: Not on file   Highest education level: Not on file  Occupational History   Occupation: RESEARCH ASSIST    Employer: [employer]    Comment: Retired   Tobacco Use   Smoking status: Former    Packs/day: 1.00    Years: 27.00    Additional pack years: 0.00    Total pack years: 27.00    Types: Cigarettes    Quit date: 12/10/1983    Years since quitting: 39.0   Smokeless tobacco: Never  Vaping Use   Vaping Use: Never used  Substance and Sexual Activity   Alcohol use: Not Currently    Comment: 01/13/2016 "I'll have a drink < 2 times/month"   Drug use: No   Sexual activity: Never    Birth control/protection: Post-menopausal  Other Topics Concern   Not on file  Social History Narrative   Daily caffeine    Social Determinants of Health   Financial Resource Strain: Not on file  Food Insecurity: Not on file  Transportation Needs: Not on file  Physical Activity: Not on file  Stress: Not on file  Social Connections: Not on file     Family History: The  patient's family history includes Allergies in her mother; Heart disease in her father. There is no history of Colon cancer, Rectal cancer, Esophageal cancer, or Stomach cancer.  ROS:   Please Rivas the history of present illness.    Review of Systems  Constitutional:  Positive for diaphoresis. Negative for chills and fever.  HENT:  Negative for congestion.   Eyes:  Negative for blurred vision.  Respiratory:  Negative for shortness of breath.   Cardiovascular:  Negative for chest pain, palpitations, orthopnea, claudication, leg swelling and PND.  Gastrointestinal:  Negative for nausea and vomiting.  Genitourinary:  Negative for hematuria.  Musculoskeletal:  Positive for joint pain.  Neurological:  Positive for dizziness. Negative for loss of consciousness.  Psychiatric/Behavioral:  Positive for memory loss.     EKGs/Labs/Other Studies Reviewed:    The following studies were reviewed today: Cardiac monitor 06/2022:   Patch wear time was 2 days and 17 hours   Predominant rhythm was NSR with average HR 76bpm   There were 4 runs of SVT with longest lasting 12 beats (some episodes were atach with variable block)   Rare ectopy (<1% SVE, <1% VE)   No sustained arrhythmias or significant pauses    TTE 05/2019: IMPRESSIONS   1. Left ventricular ejection fraction, by visual estimation, is 60 to  65%. The left ventricle has normal function. Normal left ventricular size.  There is mildly increased left ventricular hypertrophy.   2. Left ventricular diastolic Doppler parameters are indeterminate  pattern of LV diastolic filling.   3. Global right ventricle has normal systolic function.The right  ventricular size is normal. No increase in right ventricular wall  thickness.   4. Left atrial size was normal.   5. Right atrial size was normal.   6. The mitral valve is normal in structure. No evidence of mitral valve  regurgitation.   7. The tricuspid valve is normal in structure. Tricuspid  valve  regurgitation was not visualized by color flow Doppler.   8. The aortic valve is tricuspid Aortic valve regurgitation is mild by  color flow Doppler.   9. The pulmonic valve was not well visualized. Pulmonic valve  regurgitation is not visualized by color flow Doppler.  10. The inferior vena cava is normal in size with greater than  50%  respiratory variability, suggesting right atrial pressure of 3 mmHg.  EKG:  EKG is personally reviewed. 06/27/2022:  EKG was not ordered. 04/22/2022 Eligha Bridegroom, NP):  NSR at 70 bpm, low voltage QRS, anterolateral TWI, no acute change from previous tracing  Recent Labs: 04/22/2022: BUN 11; Creatinine, Ser 0.70; Potassium 4.6; Sodium 138   Recent Lipid Panel    Component Value Date/Time   CHOL 145 08/21/2019 0833   TRIG 174 (H) 08/21/2019 0833   HDL 54 08/21/2019 0833   CHOLHDL 2.7 08/21/2019 0833   CHOLHDL 4 07/15/2014 0835   VLDL 25.2 07/15/2014 0835   LDLCALC 62 08/21/2019 0833   LDLDIRECT 206.1 08/01/2007 0917      Physical Exam:    VS:  BP 126/80   Pulse 89   Ht 5' (1.524 m)   Wt 160 lb (72.6 kg)   SpO2 96%   BMI 31.25 kg/m     Wt Readings from Last 3 Encounters:  12/29/22 160 lb (72.6 kg)  06/27/22 158 lb 6.4 oz (71.8 kg)  04/22/22 155 lb (70.3 kg)     GEN:  Well nourished, well developed in no acute distress HEENT: Normal NECK: No JVD; No carotid bruits CARDIAC: RRR, no murmurs, rubs, gallops RESPIRATORY:  Clear to auscultation without rales, wheezing or rhonchi  ABDOMEN: Soft, non-tender, non-distended MUSCULOSKELETAL:  No edema; No deformity  SKIN: Warm and dry NEUROLOGIC:  Alert and oriented x 3 PSYCHIATRIC:  Normal affect   ASSESSMENT:    1. Essential hypertension   2. DOE (dyspnea on exertion)   3. Hypokalemia   4. Mixed hyperlipidemia   5. Bilateral leg cramps      PLAN:    In order of problems listed above:  #HTN: Controlled off medications. -Continue lifestyle modifications and low Na  diet  #HLD: Intolerant of statins. On PCSK9i. -Continue praulent 75mg  q14d -LDL controlled 62  #Hypothyroidism: -Continue armour per PCP  #Bilateral Leg Cramps: Occurring at night. No claudication symptoms. Resumed potassium supplemenation. -Take Mag at night -Increase hydration -Continue B complex vitamins -Compression socks  #Hypokalemia: Well controlled. -Continue potassium supplementation daily -Not on diuretics -BMET per PCP  Follow-up:  6 months.  Medication Adjustments/Labs and Tests Ordered: Current medicines are reviewed at length with the patient today.  Concerns regarding medicines are outlined above.   No orders of the defined types were placed in this encounter.  No orders of the defined types were placed in this encounter.  Patient Instructions  Medication Instructions:   Your physician recommends that you continue on your current medications as directed. Please refer to the Current Medication list given to you today.  *If you need a refill on your cardiac medications before your next appointment, please call your pharmacy*    Follow-Up: At [facility], you and your health needs are our priority.  As part of our continuing mission to provide you with exceptional heart care, we have created designated Provider Care Teams.  These Care Teams include your primary Cardiologist (physician) and Advanced Practice Providers (APPs -  Physician Assistants and Nurse Practitioners) who all work together to provide you with the care you need, when you need it.  We recommend signing up for the patient portal called "MyChart".  Sign up information is provided on this After Visit Summary.  MyChart is used to connect with patients for Virtual Visits (Telemedicine).  Patients are able to view lab/test results, encounter notes, upcoming appointments, etc.  Non-urgent messages can be sent  to your provider as well.   To learn more about what you can do with  MyChart, go to [email or web address].    Your next appointment:   1 year(s)  Provider:   Dr. Shari Prows    Signed, Meriam Sprague, MD  12/29/2022 3:25 PM    [facility]

## 2022-12-29 ENCOUNTER — Ambulatory Visit: Payer: Medicare Other | Attending: Cardiology | Admitting: Cardiology

## 2022-12-29 ENCOUNTER — Encounter: Payer: Self-pay | Admitting: Cardiology

## 2022-12-29 VITALS — BP 126/80 | HR 89 | Ht 60.0 in | Wt 160.0 lb

## 2022-12-29 DIAGNOSIS — E876 Hypokalemia: Secondary | ICD-10-CM

## 2022-12-29 DIAGNOSIS — I10 Essential (primary) hypertension: Secondary | ICD-10-CM

## 2022-12-29 DIAGNOSIS — R0609 Other forms of dyspnea: Secondary | ICD-10-CM | POA: Diagnosis present

## 2022-12-29 DIAGNOSIS — R252 Cramp and spasm: Secondary | ICD-10-CM

## 2022-12-29 DIAGNOSIS — E782 Mixed hyperlipidemia: Secondary | ICD-10-CM | POA: Diagnosis present

## 2022-12-29 NOTE — Patient Instructions (Signed)
Medication Instructions:   Your physician recommends that you continue on your current medications as directed. Please refer to the Current Medication list given to you today.  *If you need a refill on your cardiac medications before your next appointment, please call your pharmacy*    Follow-Up: At [facility], you and your health needs are our priority.  As part of our continuing mission to provide you with exceptional heart care, we have created designated Provider Care Teams.  These Care Teams include your primary Cardiologist (physician) and Advanced Practice Providers (APPs -  Physician Assistants and Nurse Practitioners) who all work together to provide you with the care you need, when you need it.  We recommend signing up for the patient portal called "MyChart".  Sign up information is provided on this After Visit Summary.  MyChart is used to connect with patients for Virtual Visits (Telemedicine).  Patients are able to view lab/test results, encounter notes, upcoming appointments, etc.  Non-urgent messages can be sent to your provider as well.   To learn more about what you can do with MyChart, go to [email or web address].    Your next appointment:   1 year(s)  Provider:   Dr. Pemberton   

## 2023-03-23 ENCOUNTER — Encounter: Payer: Self-pay | Admitting: Cardiology

## 2023-06-21 ENCOUNTER — Telehealth: Payer: Self-pay | Admitting: Cardiology

## 2023-06-21 ENCOUNTER — Telehealth: Payer: Self-pay | Admitting: Pharmacy Technician

## 2023-06-21 ENCOUNTER — Other Ambulatory Visit (HOSPITAL_COMMUNITY): Payer: Self-pay

## 2023-06-21 NOTE — Telephone Encounter (Signed)
Pharmacy Patient Advocate Encounter  Received notification from [facility] that Prior Authorization for praluent has been APPROVED from 08/29/22 to 08/28/24   PA #/Case ID/Reference #: [id number]

## 2023-06-21 NOTE — Telephone Encounter (Signed)
Spoke with pt who states that she changed to insurances. They are now stating that they [facility] ) will not cover Praluent. Pt prefers to stay on Praluent. Please advise.

## 2023-06-21 NOTE — Telephone Encounter (Signed)
Pt notified of approval

## 2023-06-21 NOTE — Telephone Encounter (Signed)
Pharmacy Patient Advocate Encounter   Received notification from Pt Calls Messages that prior authorization for praluent is required/requested.   Insurance verification completed.   The patient is insured through [place] .   Per test claim: PA required; PA submitted to [facility] via CoverMyMeds Key/confirmation #/EOC [id number] Status is pending

## 2023-06-21 NOTE — Telephone Encounter (Signed)
Pt c/o medication issue:  1. Name of Medication:   Alirocumab (PRALUENT) 75 MG/ML SOAJ    2. How are you currently taking this medication (dosage and times per day)?   Inject 75 mg into the skin every 14 (fourteen) days.    3. Are you having a reaction (difficulty breathing--STAT)? No  4. What is your medication issue? Pt states that she has changed insurance companies. They are requesting either a Prior Auth or medication or another medication to be prescribed being that it is not covered. Pt would like a callback regarding this matter.Please advise

## 2023-08-02 ENCOUNTER — Telehealth (HOSPITAL_BASED_OUTPATIENT_CLINIC_OR_DEPARTMENT_OTHER): Payer: Self-pay | Admitting: Cardiology

## 2023-08-02 NOTE — Telephone Encounter (Addendum)
Spoke with patient and she states over last couple of months she has been experiencing SOB and excessive sweating. The last couple of weeks SOB has been getting worse especially with exertion. She denies chest pain,swelling, headache, nausea or vomiting. Currently asymptomatic Appointment scheduled. ED precautions discussed.

## 2023-08-02 NOTE — Telephone Encounter (Signed)
Pt c/o Shortness Of Breath: STAT if SOB developed within the last 24 hours or pt is noticeably SOB on the phone  1. Are you currently SOB (can you hear that pt is SOB on the phone)?   No  2. How long have you been experiencing SOB?   Patient stated this has been happening for a while but has worsened in the last month  3. Are you SOB when sitting or when up moving around?   Up and moving around  4. Are you currently experiencing any other symptoms?   Excessive sweating  Patient is concerned about her SOB and excessive sweating

## 2023-08-11 ENCOUNTER — Encounter (HOSPITAL_BASED_OUTPATIENT_CLINIC_OR_DEPARTMENT_OTHER): Payer: Self-pay | Admitting: Cardiology

## 2023-08-11 ENCOUNTER — Ambulatory Visit (HOSPITAL_BASED_OUTPATIENT_CLINIC_OR_DEPARTMENT_OTHER): Payer: Medicare HMO | Admitting: Cardiology

## 2023-08-11 VITALS — BP 140/86 | HR 70 | Ht 65.0 in | Wt 166.0 lb

## 2023-08-11 DIAGNOSIS — R0609 Other forms of dyspnea: Secondary | ICD-10-CM

## 2023-08-11 DIAGNOSIS — Z712 Person consulting for explanation of examination or test findings: Secondary | ICD-10-CM | POA: Diagnosis not present

## 2023-08-11 DIAGNOSIS — I10 Essential (primary) hypertension: Secondary | ICD-10-CM

## 2023-08-11 DIAGNOSIS — E039 Hypothyroidism, unspecified: Secondary | ICD-10-CM

## 2023-08-11 NOTE — Progress Notes (Signed)
Cardiology Office Note:  .   Date:  08/20/2023  ID:  Marilyn Rivas, DOB [birth year], MRN [id number] PCP: Andi Devon, MD  [facility] Providers Cardiologist:  Jodelle Red, MD {  History of Present Illness: .   Marilyn Rivas is a 82 y.o. female with PMH mild AR, anxiety, hypothyroidism, hyperlipidemia with statin intolerance. She was previously followed by Dr. Delton See and Dr. Shari Prows. She established care with me on 08/11/23.   Today: Feels that she has had excessive sweating with minimal activity, such as folding laundry. Has been going on for at least 6 mos if not longer. Didn't tolerate summer heat or significant exertion. Sweats mostly on her neck and head. Has not had her thyroid adjusted recently.  Reviewed her recent labs. A1c 6.4. TSH 0.344 (low), but T3 and T4 within normal limits. Hgb just above normal cutoff (16.1). Reviewed her chart--has a similar episode in 2022, was seen acutely by Dr. Eldridge Dace and found to have low TSH at that time as well. Symptoms at that time were also shortness of breath and excessive sweating.  Only new medication is memantine about 2 mos ago.  Blood pressure elevated today. Usually 120-130/80s. Never this high. Notes that she is anxious today.  Notes that she gets short of breath easily. Doesn't climb stairs, but can be doing housework and feels short of breath. Gets better with rest.   No chest pain except occasionally at night, which improves with Tums. Reviewed cath from 2015, normal coronary arteries.  Goes to water exercise twice a week and is not short of breath with that.  ROS: Denies chest pain, shortness of breath at rest or with normal exertion. No PND, orthopnea, LE edema or unexpected weight gain. No syncope or palpitations. ROS otherwise negative except as noted.   Studies Reviewed: Marland Kitchen    EKG:       Physical Exam:   VS:  BP (!) 140/86   Pulse 70   Ht 5\' 5"  (1.651 m)   Wt 166 lb (75.3 kg)   SpO2  96%   BMI 27.62 kg/m    Wt Readings from Last 3 Encounters:  08/11/23 166 lb (75.3 kg)  12/29/22 160 lb (72.6 kg)  06/27/22 158 lb 6.4 oz (71.8 kg)    GEN: Well nourished, well developed in no acute distress HEENT: Normal, moist mucous membranes NECK: No JVD CARDIAC: regular rhythm, normal S1 and S2, no rubs or gallops. No murmur. VASCULAR: Radial and DP pulses 2+ bilaterally. No carotid bruits RESPIRATORY:  Clear to auscultation without rales, wheezing or rhonchi  ABDOMEN: Soft, non-tender, non-distended MUSCULOSKELETAL:  Ambulates independently SKIN: Warm and dry, no edema NEUROLOGIC:  Alert and oriented x 3. No focal neuro deficits noted. PSYCHIATRIC:  Normal affect    ASSESSMENT AND PLAN: .    Dyspnea on exertion -has been chronic based on review -completely normal cath, recently unremarkable monitor and prior echo -noted that in the past, she had similar symptoms when her thyroid numbers were similar to current. Recommend she discuss further with her PCP if this may be playing a role -reviewed red flag warning signs that need immediate medical attention  HTN: -controlled at home, above goal today -Continue lifestyle modifications and low Na diet   HLD: Intolerant of statins. On PCSK9i. -Continue praulent -LDL controlled 62   Hypothyroidism Excessive sweating -TSH recently low, T3/T4 within normal limts   CV risk counseling and prevention -recommend heart healthy/Mediterranean diet, with whole grains, fruits, vegetable, fish,  lean meats, nuts, and olive oil. Limit salt. -recommend moderate walking, 3-5 times/week for 30-50 minutes each session. Aim for at least 150 minutes.week. Goal should be pace of 3 miles/hours, or walking 1.5 miles in 30 minutes -recommend avoidance of tobacco products. Avoid excess alcohol. -ASCVD risk score: The ASCVD Risk score (Arnett DK, et al., 2019) failed to calculate for the following reasons:   The 2019 ASCVD risk score is only valid  for ages 102 to 52    Dispo: 6 mos or sooner as needed  Signed, Jodelle Red, MD   Jodelle Red, MD, PhD, [facility] [place]  [facility]  [place]  [facility] at [facility] at [facility] [address], [address] [place], [place] [address] [phone]

## 2023-08-11 NOTE — Patient Instructions (Signed)
Medication Instructions:  Continue all medications *If you need a refill on your cardiac medications before your next appointment, please call your pharmacy*   Lab Work: None ordered   Testing/Procedures: None ordered   Follow-Up: At [facility], you and your health needs are our priority.  As part of our continuing mission to provide you with exceptional heart care, we have created designated Provider Care Teams.  These Care Teams include your primary Cardiologist (physician) and Advanced Practice Providers (APPs -  Physician Assistants and Nurse Practitioners) who all work together to provide you with the care you need, when you need it.  We recommend signing up for the patient portal called "MyChart".  Sign up information is provided on this After Visit Summary.  MyChart is used to connect with patients for Virtual Visits (Telemedicine).  Patients are able to view lab/test results, encounter notes, upcoming appointments, etc.  Non-urgent messages can be sent to your provider as well.   To learn more about what you can do with MyChart, go to [email or web address].    Your next appointment:  6 months    Call in March to schedule June appointment     Provider:  Dr.Christopher

## 2023-08-20 ENCOUNTER — Encounter (HOSPITAL_BASED_OUTPATIENT_CLINIC_OR_DEPARTMENT_OTHER): Payer: Self-pay | Admitting: Cardiology

## 2023-08-31 ENCOUNTER — Encounter (HOSPITAL_BASED_OUTPATIENT_CLINIC_OR_DEPARTMENT_OTHER): Payer: Self-pay

## 2023-09-01 NOTE — Telephone Encounter (Signed)
 Can we renew her [employer] grant, please?  TY!

## 2023-09-04 ENCOUNTER — Telehealth (HOSPITAL_BASED_OUTPATIENT_CLINIC_OR_DEPARTMENT_OTHER): Payer: Self-pay | Admitting: *Deleted

## 2023-09-04 NOTE — Telephone Encounter (Signed)
 Patient has changed insurances and need PA on [place]  New information below   ID          [id number] BIN        [id number] PCN      [id number] GRP      [id number]   Start 09/23/23 End   09/21/24  Will forward to PA team for review

## 2023-09-05 ENCOUNTER — Other Ambulatory Visit (HOSPITAL_COMMUNITY): Payer: Self-pay

## 2023-09-05 ENCOUNTER — Telehealth: Payer: Self-pay | Admitting: Pharmacy Technician

## 2023-09-05 NOTE — Telephone Encounter (Signed)
 Mychart message sent to patient.

## 2023-09-05 NOTE — Telephone Encounter (Signed)
 Pharmacy Patient Advocate Encounter   Received notification from Pt Calls Messages that prior authorization for Praluent  75MG /ML auto-injectors is required/requested.   Insurance verification completed.   The patient is insured through [facility] .   Per test claim: PA required; PA submitted to above mentioned insurance via CoverMyMeds Key/confirmation #/EOC [id number] Status is pending

## 2023-09-05 NOTE — Telephone Encounter (Signed)
 PA request has been Submitted. New Encounter created for follow up. For additional info see Pharmacy Prior Auth telephone encounter from 09/05/23.

## 2023-09-05 NOTE — Telephone Encounter (Signed)
 Pharmacy Patient Advocate Encounter  Received notification from [employer] that Prior Authorization for Praluent  75MG /ML auto-injectors  has been APPROVED from 09/05/23 to 08/28/24. Ran test claim, Copay is $331.25. This test claim was processed through [facility]- copay amounts may vary at other pharmacies due to pharmacy/plan contracts, or as the patient moves through the different stages of their insurance plan. I called and spoke to the pharmacy to see if they could fill it originally and they said the prescription they have on file is expired. Patient would also need a new prescription sent in to her pharmacy   PA #/Case ID/Reference #: [id number]

## 2023-09-08 MED ORDER — PRALUENT 75 MG/ML ~~LOC~~ SOAJ: 75.0000 mg | SUBCUTANEOUS | 3 refills | Status: DC

## 2023-09-08 NOTE — Telephone Encounter (Signed)
 See mychart message from [employer] D 1/10

## 2023-09-15 ENCOUNTER — Telehealth (HOSPITAL_BASED_OUTPATIENT_CLINIC_OR_DEPARTMENT_OTHER): Payer: Self-pay | Admitting: Cardiology

## 2023-09-15 NOTE — Telephone Encounter (Signed)
Discussed with Dr Cristal Deer and ok to wait to get 09/23/2023 Advised patient, verbalized understanding

## 2023-09-15 NOTE — Telephone Encounter (Signed)
Pt c/o medication issue:  1. Name of Medication:   Alirocumab (PRALUENT) 75 MG/ML SOAJ  Repatha  2. How are you currently taking this medication (dosage and times per day)?   3. Are you having a reaction (difficulty breathing--STAT)?   4. What is your medication issue?   Patient stated she has missed a dose of the Praluent on 1/15 and is concerned and confused on if she can get the Praluent or if she should get a prescription for the Repath.  Patient stated she is completely out of the Praluent and took her last dose on August 30, 2023.  Patient wants a call back to discuss next steps.

## 2023-10-16 ENCOUNTER — Telehealth (HOSPITAL_BASED_OUTPATIENT_CLINIC_OR_DEPARTMENT_OTHER): Payer: Self-pay | Admitting: Cardiology

## 2023-10-16 NOTE — Telephone Encounter (Signed)
  Per MyChart Scheduling message:  Initial complaint: Sometimes I am breathless   Pt c/o Shortness Of Breath: STAT if SOB developed within the last 24 hours or pt is noticeably SOB on the phone  1. Are you currently SOB (can you hear that pt is SOB on the phone)?   2. How long have you been experiencing SOB?   3. Are you SOB when sitting or when up moving around?   4. Are you currently experiencing any other symptoms?     Shortness when moving around not when still

## 2023-10-16 NOTE — Telephone Encounter (Signed)
 Left message to call back

## 2023-10-17 ENCOUNTER — Telehealth: Payer: Self-pay | Admitting: Cardiology

## 2023-10-17 NOTE — Telephone Encounter (Signed)
Excessive sweating would not be related to her cardiac medications. Would recommend continued follow up with PCP.  Per Dr. Di Kindle 07/2023 note her exertional dyspnea was stable at baseline. If it has worsened, would recommend OV for further evaluation.   Alver Sorrow, NP

## 2023-10-17 NOTE — Telephone Encounter (Signed)
Patient would like a call back to discuss excessive sweating she seems to be experiencing.

## 2023-10-17 NOTE — Telephone Encounter (Signed)
See telephone encounter on 2/18.

## 2023-10-17 NOTE — Telephone Encounter (Signed)
Called and spoke to pt. Pt has c/o excessive sweating which has been happening for more than 6 months. Pt f/u with PCP "a couple week ago" and said her thyroid was ok. Excessive sweating persists and also has DOE. Excessive sweating happens on back of neck and back of head to the point of hair being wet.  Pt says he recently had labs but I am unable to see results. Advised pt that info will be sent to MD/APP for review. She verbalized understanding.  Pt also has issue with Praluent. Pt wondering if she will need to renew grant to help pay for charges. Made her aware that grant is active until January 2026 but her primary insurance is still asking for a $300+ copay for Praluent? Will route to PharmD.

## 2023-10-18 NOTE — Telephone Encounter (Signed)
Called and spoke to pt. Advised to f/u with PCP for excessive sweating. DOE is still the same according to pt. She verbalized understanding.

## 2024-02-20 ENCOUNTER — Other Ambulatory Visit: Payer: Self-pay | Admitting: Internal Medicine

## 2024-02-20 ENCOUNTER — Encounter: Payer: Self-pay | Admitting: Internal Medicine

## 2024-02-20 DIAGNOSIS — R42 Dizziness and giddiness: Secondary | ICD-10-CM

## 2024-02-22 ENCOUNTER — Ambulatory Visit
Admission: RE | Admit: 2024-02-22 | Discharge: 2024-02-22 | Disposition: A | Discharge status: Home / Self Care | Source: Ambulatory Visit | Attending: Internal Medicine | Admitting: Internal Medicine

## 2024-02-22 DIAGNOSIS — R42 Dizziness and giddiness: Secondary | ICD-10-CM

## 2024-02-28 ENCOUNTER — Telehealth: Payer: Self-pay | Admitting: Internal Medicine

## 2024-02-28 NOTE — Telephone Encounter (Signed)
 Patient called and stated that she was having a lot of abdominal pain and some bowel movement changes. Patient is requesting to speak to the nurse. Please advise.

## 2024-02-28 NOTE — Telephone Encounter (Signed)
 Pt states she has been having abd pain on her lower left abd, changes in her bowel habits, and rectal pain. Requesting to be seen. Pt scheduled to see Alan 03/04/24@2 :30pm. Pt aware of appt.

## 2024-03-04 ENCOUNTER — Other Ambulatory Visit (INDEPENDENT_AMBULATORY_CARE_PROVIDER_SITE_OTHER)

## 2024-03-04 ENCOUNTER — Ambulatory Visit: Admitting: Physician Assistant

## 2024-03-04 ENCOUNTER — Encounter: Payer: Self-pay | Admitting: Physician Assistant

## 2024-03-04 VITALS — BP 126/78 | HR 78 | Ht 65.0 in | Wt 162.0 lb

## 2024-03-04 DIAGNOSIS — R109 Unspecified abdominal pain: Secondary | ICD-10-CM

## 2024-03-04 DIAGNOSIS — R194 Change in bowel habit: Secondary | ICD-10-CM

## 2024-03-04 DIAGNOSIS — R1032 Left lower quadrant pain: Secondary | ICD-10-CM

## 2024-03-04 DIAGNOSIS — R32 Unspecified urinary incontinence: Secondary | ICD-10-CM | POA: Diagnosis not present

## 2024-03-04 DIAGNOSIS — Z860101 Personal history of adenomatous and serrated colon polyps: Secondary | ICD-10-CM

## 2024-03-04 DIAGNOSIS — K648 Other hemorrhoids: Secondary | ICD-10-CM

## 2024-03-04 DIAGNOSIS — R1012 Left upper quadrant pain: Secondary | ICD-10-CM

## 2024-03-04 LAB — CBC WITH DIFFERENTIAL/PLATELET
Basophils Absolute: 0.1 K/uL (ref 0.0–0.1)
Basophils Relative: 0.7 % (ref 0.0–3.0)
Eosinophils Absolute: 0.2 K/uL (ref 0.0–0.7)
Eosinophils Relative: 3 % (ref 0.0–5.0)
HCT: 46.2 % — ABNORMAL HIGH (ref 36.0–46.0)
Hemoglobin: 15.8 g/dL — ABNORMAL HIGH (ref 12.0–15.0)
Lymphocytes Relative: 32.6 % (ref 12.0–46.0)
Lymphs Abs: 2.6 K/uL (ref 0.7–4.0)
MCHC: 34.3 g/dL (ref 30.0–36.0)
MCV: 93.1 fl (ref 78.0–100.0)
Monocytes Absolute: 0.7 K/uL (ref 0.1–1.0)
Monocytes Relative: 8.4 % (ref 3.0–12.0)
Neutro Abs: 4.4 K/uL (ref 1.4–7.7)
Neutrophils Relative %: 55.3 % (ref 43.0–77.0)
Platelets: 314 K/uL (ref 150.0–400.0)
RBC: 4.96 Mil/uL (ref 3.87–5.11)
RDW: 12.5 % (ref 11.5–15.5)
WBC: 7.9 K/uL (ref 4.0–10.5)

## 2024-03-04 LAB — HEPATIC FUNCTION PANEL
ALT: 27 U/L (ref 0–35)
AST: 24 U/L (ref 0–37)
Albumin: 4.5 g/dL (ref 3.5–5.2)
Alkaline Phosphatase: 72 U/L (ref 39–117)
Bilirubin, Direct: 0.2 mg/dL (ref 0.0–0.3)
Total Bilirubin: 0.7 mg/dL (ref 0.2–1.2)
Total Protein: 7.1 g/dL (ref 6.0–8.3)

## 2024-03-04 LAB — BASIC METABOLIC PANEL WITH GFR
BUN: 7 mg/dL (ref 6–23)
CO2: 28 meq/L (ref 19–32)
Calcium: 9.9 mg/dL (ref 8.4–10.5)
Chloride: 104 meq/L (ref 96–112)
Creatinine, Ser: 0.63 mg/dL (ref 0.40–1.20)
GFR: 81.82 mL/min (ref >60.00)
Glucose, Bld: 97 mg/dL (ref 70–99)
Potassium: 3.9 meq/L (ref 3.5–5.1)
Sodium: 141 meq/L (ref 135–145)

## 2024-03-04 LAB — SEDIMENTATION RATE: Sed Rate: 2 mm/h (ref 0–30)

## 2024-03-04 LAB — TSH: TSH: 0.09 u[IU]/mL — ABNORMAL LOW (ref 0.35–5.50)

## 2024-03-04 MED ORDER — HYDROCORTISONE ACETATE 25 MG RE SUPP: 25.0000 mg | Freq: Two times a day (BID) | RECTAL | 0 refills | Status: DC

## 2024-03-04 MED ORDER — HYOSCYAMINE SULFATE 0.125 MG SL SUBL: 0.1250 mg | SUBLINGUAL_TABLET | SUBLINGUAL | 6 refills | Status: DC | PRN

## 2024-03-04 MED ORDER — HYDROCORTISONE ACETATE 25 MG RE SUPP: 25.0000 mg | Freq: Two times a day (BID) | RECTAL | 0 refills | Status: AC

## 2024-03-04 MED ORDER — HYOSCYAMINE SULFATE 0.125 MG SL SUBL: 0.1250 mg | SUBLINGUAL_TABLET | SUBLINGUAL | 6 refills | Status: AC | PRN

## 2024-03-04 NOTE — Progress Notes (Signed)
 Noted

## 2024-03-04 NOTE — Progress Notes (Signed)
 03/04/2024 Marilyn Rivas [id number] 1941/06/15  Referring provider: Theo Iha, MD Primary GI doctor: Dr. Abran  ASSESSMENT AND PLAN:  Abdominal pain and change in bowel habits History of constipation, one episode diverticulitis 5 years ago resolved 05/05/2022 CT abdomen pelvis with contrast for abdominal pain diverticulosis without diverticulitis small uterine fibroid mild hepatic steatosis 2 weeks of left lower AB pain radiation to her back, constant pain but varying in intensity, mucus in her stools, pressure to have BM but she is unable to have BM, started on low residue diet several days Worse with food, worse in the morning, better with lying down No nausea, vomiting, no fever, but she has chills, having sweating on cymbalta, started on klonopin, had vertigo, now off medications Having urinary incontinence last several days, frequency but no dysuria - check labs for CBC, CMET, ESR - get CT AB and pelvis with contrast rule out diverticulitis - will treat hemorrhoids - check urine for infection - refilled levsin  - recheck TSH, was elevated last visit - close follow up  Personal history of adenomatous colon polyps Colonoscopy 2018 pandiverticulosis  GERD with history of dysphagia July 2014 EGD with empiric dilation No symptoms at this time  Hepatic steatosis Seen on CT abdomen pelvis with contrast 05/05/2022 normal spleen Slight elevation of LFTs, recheck   Patient Care Team: Theo Iha, MD as PCP - General (Internal Medicine) Lonni Slain, MD as PCP - Cardiology (Cardiology)  HISTORY OF PRESENT ILLNESS: 83 y.o. female with a past medical history listed below presents for evaluation of abdominal pain and change in bowel habits.   Last seen in the office 04/22/2022 by Dr. Abran for abdominal discomfort.  Discussed the use of AI scribe software for clinical note transcription with the patient, who gave verbal consent to proceed.  History  of Present Illness   Marilyn Rivas is an 83 year old female with diverticulitis who presents with changes in bowel habits and abdominal pain.  She has a history of diverticulitis and diverticulosis, with the last episode of diverticulitis occurring approximately five years ago. Her last colonoscopy was in 2018, and a CT scan of the abdomen and pelvis was performed in 2023, which showed diverticulosis without inflammation.  Approximately two weeks ago, she began experiencing left lower abdominal pain that radiates to her back. The pain is described as a constant aching that varies in intensity from 5 to 8 on a scale of 1 to 10. It is worse in the morning until she has a bowel movement and is alleviated by lying down. Eating exacerbates the pain.  She has noticed changes in her bowel habits, including flat stools and a sensation of incomplete evacuation. She reports having two bowel movements per day, which is normal for her, but the stools are flat and orangey in color. She has also experienced mucus in her stools and a feeling of pressure to have a bowel movement without being able to do so. She has recently started a low residue diet, consuming foods like jello, mashed potatoes, and clear soup, to manage her symptoms. No nausea, vomiting, or significant weight loss.  In the past few days, she has developed urinary incontinence, characterized by an inability to reach the bathroom in time, and increased frequency of urination without burning or discomfort. No stool incontinence.  She has a history of taking duloxetine, which was stopped due to excessive sweating. She transitioned to Pristiq and then Klonopin, but eventually stopped all medications due to side effects, including  vertigo and lightheadedness. She reports difficulty thinking and is unable to drive due to these symptoms. Her last lab work in January included tests for calcium , liver function, and thyroid  levels. She is on levothyroxine ,  which has not been adjusted recently.      She  reports that she quit smoking about 40 years ago. Her smoking use included cigarettes. She started smoking about 67 years ago. She has a 27 pack-year smoking history. She has never used smokeless tobacco. She reports that she does not currently use alcohol. She reports that she does not use drugs.  RELEVANT GI HISTORY, IMAGING AND LABS: Results   LABS TSH: 0.262 (02/13/2024) Hb: 15.6 (02/13/2024) PLT: 288 (02/13/2024) AST: 30 (02/13/2024) ALT: 37 (02/13/2024) Alkaline phosphatase: 85 (02/13/2024) Calcium : Elevated (02/13/2024) Urinalysis: White blood cells present  RADIOLOGY CT abdomen and pelvis: No inflammation, diverticulosis present (2023)  DIAGNOSTIC Colonoscopy: Diverticulosis (2018) Rectal exam: Internal hemorrhoids, positive for blood (03/04/2024)      CBC    Component Value Date/Time   WBC 7.9 04/21/2021 1552   WBC 10.3 05/13/2020 1324   RBC 4.90 04/21/2021 1552   RBC 4.87 05/13/2020 1324   HGB 15.7 04/21/2021 1552   HCT 45.0 04/21/2021 1552   PLT 254 04/21/2021 1552   MCV 92 04/21/2021 1552   MCH 32.0 04/21/2021 1552   MCH 32.4 05/13/2020 1324   MCHC 34.9 04/21/2021 1552   MCHC 35.7 05/13/2020 1324   RDW 12.9 04/21/2021 1552   LYMPHSABS 2.0 05/13/2020 1324   MONOABS 1.1 (H) 05/13/2020 1324   EOSABS 0.1 05/13/2020 1324   BASOSABS 0.1 05/13/2020 1324   No results for input(s): HGB in the last 8760 hours.  CMP     Component Value Date/Time   NA 138 04/22/2022 1058   NA 140 04/21/2021 1552   K 4.6 04/22/2022 1058   CL 104 04/22/2022 1058   CO2 28 04/22/2022 1058   GLUCOSE 108 (H) 04/22/2022 1058   BUN 11 04/22/2022 1058   BUN 22 04/21/2021 1552   CREATININE 0.70 04/22/2022 1058   CREATININE 0.64 10/07/2014 1202   CALCIUM  9.7 04/22/2022 1058   PROT 6.0 04/21/2021 1552   ALBUMIN 4.3 04/21/2021 1552   AST 20 04/21/2021 1552   ALT 29 04/21/2021 1552   ALKPHOS 117 04/21/2021 1552   BILITOT <0.2  04/21/2021 1552   GFRNONAA 85 06/29/2020 1211   GFRAA 98 06/29/2020 1211      Latest Ref Rng & Units 04/21/2021    3:52 PM 05/13/2020    1:24 PM 08/21/2019    8:33 AM  Hepatic Function  Total Protein 6.0 - 8.5 g/dL 6.0  6.9  6.8   Albumin 3.7 - 4.7 g/dL 4.3  3.9  4.4   AST 0 - 40 IU/L 20  19  24    ALT 0 - 32 IU/L 29  19  26    Alk Phosphatase 44 - 121 IU/L 117  73  100   Total Bilirubin 0.0 - 1.2 mg/dL <9.7  1.2  0.9       Current Medications:   Current Outpatient Medications (Endocrine & Metabolic):    levothyroxine  (SYNTHROID ) 25 MCG tablet, Take 25 mcg by mouth daily before breakfast.   NP THYROID  60 MG tablet, Take 60 mg by mouth daily before breakfast.   Current Outpatient Medications (Cardiovascular):    Alirocumab  (PRALUENT ) 75 MG/ML SOAJ, Inject 1 mL (75 mg total) into the skin every 14 (fourteen) days.  Current Outpatient Medications (Other):    Cholecalciferol (VITAMIN D3) 5000 UNITS TABS, Take 1 tablet by mouth every Monday, Wednesday, and Friday.    hydrocortisone  (ANUSOL -HC) 25 MG suppository, Place 1 suppository (25 mg total) rectally 2 (two) times daily.   Lifitegrast (XIIDRA OP), Apply to eye 2 (two) times daily. For dry eyes   Magnesium  200 MG TABS, Take 400 mg by mouth daily. Per patient taking 2 tablets in the am   potassium chloride  (KLOR-CON ) 10 MEQ tablet, Take 20 mEq by mouth daily.   Probiotic Product (PROBIOTIC PO), Take 1-2 capsules by mouth as needed (for GI support).    hyoscyamine  (LEVSIN  SL) 0.125 MG SL tablet, Place 1 tablet (0.125 mg total) under the tongue every 4 (four) hours as needed.   memantine (NAMENDA) 5 MG tablet, Take 5 mg by mouth 2 (two) times daily. (Patient not taking: Reported on 03/04/2024)   Facility-Administered Medications Ordered in Other Visits (Other):    chlorhexidine  (HIBICLENS ) 4 % liquid 4 application   chlorhexidine  (HIBICLENS ) 4 % liquid 4 application No current facility-administered medications for this  visit.  Medical History:  Past Medical History:  Diagnosis Date   Anxiety    Arthritis    left; maybe back (01/13/2016)   Cancer (HCC)    skin pre-cancer   Closed fracture of left distal radius    Depression    Diverticulitis    Diverticulosis    Family history of adverse reaction to anesthesia    daughter gets really nauseous & has trouble getting intubated (01/13/2016)   GERD (gastroesophageal reflux disease)    HISTORY   History of blood transfusion 1977   related to OR   Hx of adenomatous colonic polyps    Hyperlipidemia    Hypertension    Hypothyroidism    Lichen sclerosus 01/2014   Biopsy proven   Menopause    Mild aortic insufficiency    Osteopenia    Seasonal allergies    Allergies:  Allergies  Allergen Reactions   Aleve [Naproxen Sodium] Other (See Comments)    Other reaction(s): Other Severe abdominal pain   Aspirin  Other (See Comments)    Other reaction(s): Other severe abdominal pain and excessive salavation    Atorvastatin  Other (See Comments)    Other reaction(s): Myalgias (Muscle Pain) myalgia   Clarithromycin Rash, Other (See Comments) and Diarrhea    Says allergic to mycins   Crestor   [Rosuvastatin  Calcium ]     Other reaction(s): Other   Durezol [Difluprednate] Other (See Comments)    Other reaction(s): Eye Redness Eye redness     Olopatadine Hcl     Other reaction(s): Eye Redness   Ondansetron      Other reaction(s): Other, headache   Penicillins Swelling, Rash, Other (See Comments) and Itching    Hands swelling Has patient had a PCN reaction causing immediate rash, facial/tongue/throat swelling, SOB or lightheadedness with hypotension: yes Has patient had a PCN reaction causing severe rash involving mucus membranes or skin necrosis: no Has patient had a PCN reaction that required hospitalization no Has patient had a PCN reaction occurring within the last 10 years: no If all of the above answers are NO, then may proceed with  Cephalosporin use.     Pitavastatin      Other reaction(s): Arthralgia (Joint Pain)   Statins Other (See Comments)    Other reaction(s): Other (See Comments) MUSCLE CRAMPS Muscle cramps   Chlorthalidone  Other (See Comments)    Pt reports causes extreme dry mouth and throat  Crestor  [Rosuvastatin ] Other (See Comments)    Muscle aches   Other Other (See Comments)    Paseo eye drops - causes eye redness   Prednisone     headache   Adhesive [Tape] Rash and Other (See Comments)    Looks burned   Zofran  [Ondansetron  Hcl] Other (See Comments)    Headache      Surgical History:  She  has a past surgical history that includes Appendectomy (1977); Right oophorectomy (1978); Patella reconstruction (Left, 1994); Cervical fusion (1999); Lumbar disc surgery (2004); Dilatation & curettage/hysteroscopy with trueclear (N/A, 05/02/2013); Hysteroscopy; Dilation and curettage of uterus; Hardware Removal (Left, 12/12/2013); Knee arthroscopy (Left, 12/12/2013); left heart catheterization with coronary angiogram (N/A, 04/22/2014); Blepharoplasty (Bilateral); Cataract extraction w/ intraocular lens  implant, bilateral (Bilateral); Replacement unicondylar joint knee (Left, 01/13/2016); Back surgery; Tonsillectomy; Carpal tunnel release (Right); Joint replacement; Cardiac catheterization (~ 2014); Partial knee arthroplasty (Left, 01/13/2016); Achilles tendon surgery (Right, 04/25/2016); and Open reduction internal fixation (orif) distal radial fracture (Left, 05/07/2020). Family History:  Her family history includes Allergies in her mother; Heart disease in her father.  REVIEW OF SYSTEMS  : All other systems reviewed and negative except where noted in the History of Present Illness.  PHYSICAL EXAM: BP 126/78   Pulse 78   Ht 5' 5 (1.651 m)   Wt 162 lb (73.5 kg)   BMI 26.96 kg/m  Physical Exam   GENERAL APPEARANCE: Well nourished, in no apparent distress. HEENT: No cervical lymphadenopathy, unremarkable thyroid ,  sclerae anicteric, conjunctiva pink. RESPIRATORY: Respiratory effort normal, breath sounds equal bilateral without rales, rhonchi, wheezing. CARDIO: Regular rate and rhythm with no murmurs, rubs, or gallops, peripheral pulses intact. ABDOMEN: Soft, non-distended, active bowel sounds in all four quadrants, tenderness in left lower quadrant and right upper quadrant on palpation, no rebound, no mass appreciated. RECTAL: No external hemorrhoids or fissures, internal hemorrhoids present, no masses felt in rectum, hemoccult positive for blood. MUSCULOSKELETAL: Full range of motion, normal gait, without edema. SKIN: Dry, intact without rashes or lesions. No jaundice. NEURO: Alert, oriented, no focal deficits. PSYCH: Cooperative, normal mood and affect.      Alan JONELLE Coombs, PA-C 3:49 PM

## 2024-03-04 NOTE — Patient Instructions (Addendum)
 _______________________________________________________  If your blood pressure at your visit was 140/90 or greater, please contact your primary care physician to follow up on this.  _______________________________________________________  If you are age 84 or older, your body mass index should be between 23-30. Your Body mass index is 26.96 kg/m. If this is out of the aforementioned range listed, please consider follow up with your Primary Care Provider.  If you are age 55 or younger, your body mass index should be between 19-25. Your Body mass index is 26.96 kg/m. If this is out of the aformentioned range listed, please consider follow up with your Primary Care Provider.   ________________________________________________________  The [facility] providers would like to encourage you to use MYCHART to communicate with providers for non-urgent requests or questions.  Due to long hold times on the telephone, sending your provider a message by [facility] may be a faster and more efficient way to get a response.  Please allow 48 business hours for a response.  Please remember that this is for non-urgent requests.  _______________________________________________________  Your provider has requested that you go to the basement level for lab work before leaving today. Press B on the elevator. The lab is located at the first door on the left as you exit the elevator.  You have been scheduled for a CT scan of the abdomen and pelvis at [facility]1st floor Radiology. You are scheduled on 03-05-24 at 3pm. You should arrive at 12:45 for registration and to drink contrast. Please follow the written instructions below on the day of your exam:   1) Do not eat anything after 11am (4 hours prior to your test)   You may take any medications as prescribed with a small amount of water, if necessary. If you take any of the following medications: METFORMIN, GLUCOPHAGE, GLUCOVANCE, AVANDAMET, RIOMET,  FORTAMET, ACTOPLUS MET, JANUMET, GLUMETZA or METAGLIP, you MAY be asked to HOLD this medication 48 hours AFTER the exam.   The purpose of you drinking the oral contrast is to aid in the visualization of your intestinal tract. The contrast solution may cause some diarrhea. Depending on your individual set of symptoms, you may also receive an intravenous injection of x-ray contrast/dye. Plan on being at [facility] for 45 minutes or longer, depending on the type of exam you are having performed.   If you have any questions regarding your exam or if you need to reschedule, you may call [facility] at [phone] between the hours of 8:00 am and 5:00 pm, Monday-Friday.   Due to recent changes in healthcare laws, you may see the results of your imaging and laboratory studies on MyChart before your provider has had a chance to review them.  We understand that in some cases there may be results that are confusing or concerning to you. Not all laboratory results come back in the same time frame and the provider may be waiting for multiple results in order to interpret others.  Please give us  48 hours in order for your provider to thoroughly review all the results before contacting the office for clarification of your results.    VISIT SUMMARY:  During your visit, we discussed your recent abdominal pain, changes in bowel habits, and new onset of urinary incontinence. We reviewed your history of diverticulitis and diverticulosis, and recent imaging and lab results. We also addressed your symptoms of dizziness and lightheadedness, and reviewed your thyroid  function and previous lab results.  YOUR PLAN:  -ABDOMINAL PAIN: Your  persistent left lower abdominal pain may be due to diverticulitis, kidney stones, or another cause. We will perform a CT scan of your abdomen and pelvis to investigate further. Continue with your low residue diet to help manage your symptoms.  -INTERNAL HEMORRHOIDS: Internal  hemorrhoids are swollen veins in your rectum that can cause symptoms like flat stools and blood in your stool. We have prescribed suppositories and IV guard (peppermint oil) to help with your symptoms. Additionally, you should take LEVSIN  at night to relieve discomfort.  -URINARY INCONTINENCE: Urinary incontinence is the inability to control your bladder. We will perform a urinalysis to check for a urinary tract infection (UTI) as a possible cause.  -DIZZINESS AND LIGHTHEADEDNESS: Your dizziness and lightheadedness may be related to your recent medication changes or a possible UTI. We will perform a urinalysis and monitor your symptoms to adjust your treatment as needed.  -HYPERCALCEMIA: Hypercalcemia means you have high levels of calcium  in your blood, which could be due to too much vitamin D  or dehydration. We will recheck your calcium  levels to determine the cause.  -ELEVATED LIVER ENZYMES: Elevated liver enzymes can indicate liver inflammation or damage. We will recheck your liver function tests to investigate further.  -HYPOTHYROIDISM: Hypothyroidism means your thyroid  is not producing enough hormones, despite your current medication. We will monitor your thyroid  function and adjust your levothyroxine  dosage if necessary.  INSTRUCTIONS:  Please follow up with the recommended CT scan of your abdomen and pelvis, and the urinalysis. Continue with your low residue diet and take the prescribed medications as directed. We will recheck your calcium  levels and liver function tests, and monitor your thyroid  function to adjust your levothyroxine  dosage if needed.

## 2024-03-05 ENCOUNTER — Ambulatory Visit (HOSPITAL_BASED_OUTPATIENT_CLINIC_OR_DEPARTMENT_OTHER)
Admission: RE | Admit: 2024-03-05 | Discharge: 2024-03-05 | Disposition: A | Discharge status: Home / Self Care | Source: Ambulatory Visit | Attending: Physician Assistant | Admitting: Physician Assistant

## 2024-03-05 ENCOUNTER — Encounter (HOSPITAL_BASED_OUTPATIENT_CLINIC_OR_DEPARTMENT_OTHER): Payer: Self-pay

## 2024-03-05 ENCOUNTER — Ambulatory Visit: Payer: Self-pay | Admitting: Physician Assistant

## 2024-03-05 DIAGNOSIS — R1012 Left upper quadrant pain: Secondary | ICD-10-CM | POA: Insufficient documentation

## 2024-03-05 LAB — URINALYSIS, ROUTINE W REFLEX MICROSCOPIC
Bilirubin Urine: NEGATIVE
Ketones, ur: NEGATIVE
Nitrite: NEGATIVE
Specific Gravity, Urine: <=1.005 — AB (ref 1.000–1.030)
Total Protein, Urine: NEGATIVE
Urine Glucose: NEGATIVE
Urobilinogen, UA: 0.2 (ref 0.0–1.0)
pH: 6 (ref 5.0–8.0)

## 2024-03-05 MED ORDER — IOHEXOL 300 MG/ML  SOLN
100.0000 mL | Freq: Once | INTRAMUSCULAR | Status: AC | PRN
  Administered 2024-03-05: 100 mL via INTRAVENOUS

## 2024-03-07 ENCOUNTER — Other Ambulatory Visit: Payer: Self-pay | Admitting: Neurology

## 2024-03-07 ENCOUNTER — Ambulatory Visit: Admitting: Neurology

## 2024-03-07 ENCOUNTER — Encounter: Payer: Self-pay | Admitting: Neurology

## 2024-03-07 VITALS — BP 157/88 | HR 88 | Ht 60.0 in | Wt 163.5 lb

## 2024-03-07 DIAGNOSIS — R413 Other amnesia: Secondary | ICD-10-CM

## 2024-03-07 DIAGNOSIS — R42 Dizziness and giddiness: Secondary | ICD-10-CM | POA: Diagnosis not present

## 2024-03-07 DIAGNOSIS — R260 Ataxic gait: Secondary | ICD-10-CM

## 2024-03-07 MED ORDER — DONEPEZIL HCL 5 MG/DAY TD PTWK
MEDICATED_PATCH | TRANSDERMAL | 11 refills | Status: DC
Start: 2024-03-07 — End: 2024-03-13

## 2024-03-07 NOTE — Progress Notes (Signed)
 [facility]  PATIENT: Marilyn Rivas DOB: 26-Aug-1941  REFERRING DOCTOR OR PCP: Suzen Lamer, MD SOURCE: Patient, daughter, notes from PCP, imaging and lab reports, images personally reviewed.  _________________________________   HISTORICAL  CHIEF COMPLAINT:  Chief Complaint  Patient presents with   New Patient (Initial Visit)    Pt in room 11. Daughter kelly in room. Paper referral peristent dizziness, unsteadiness. Patient feel dizzy when walking, feels confused with dizziness at time. No recent falls. Orthostatic vitals done.    HISTORY OF PRESENT ILLNESS:  I had the pleasure of seeing your patient, Marilyn Rivas, at [facility] for neurologic consultation regarding her dizziness and memory issues  She is an 83 year old woman who has noted a sensation of being off balance and dizziness over the past few months.  She was on duloxetine and it was felt to be causing excessive sweating.   She was switched over to Pristiq.  Sweating improved but she became lightheaded.   There may have been a small amount of vertigo.    Therefore, it was stopped.  She has been off all antidepressants x 6 weeks.  She was prescribed some clonazepam during the transition.  She went to vestibular therapy and had canalolith repositioning procedures - these made her feel more vertigo short term during the procedure but had no long term change.      She also notes gait is off.  She feels her steps are heavier and she swys more with her walking this year.   She did have hearing tested earlier this year.   She wears hearing aids.  She has noted some urinary urgency, especially over the past few months.  She has a recent urinalysis that showed a few white blood cells.  Culture is pending.  She has had some memory issues noted by her daughter.   She has had some some word-finding issues.   She was placed on memantine and she felt cognition may have been better but  her legs felt worse so she stopped.   Her daughter died in her home and she found her in 2022-04-13 (accidental overdose).   Her grand-daughter died in 2020/04/13.  She has had depression with these deaths. She had been placed on duloxetine around 2018/04/13 due to stress with her daughter.     She is on synthroid  and [place] thyroid  for hypothyroidism  Recent TSH was reduced at 0.09.  She is otherwise in good health.     03/07/2024    5:43 PM  MMSE - Mini Mental State Exam  Orientation to time 5  Orientation to Place 5  Registration 3  Attention/ Calculation 3  Recall 1  Language- name 2 objects 2  Language- repeat 1  Language- follow 3 step command 3  Language- read & follow direction 1  Write a sentence 1  Copy design 1  Total score 26     IMAGING MRI brain 05/23/2020 after a fall was performed and showed mild generalized atrophy and mild chronic microvascular ischemic change.      CT brain 02/22/2024 shows mild generalized cortical atrophy and mild chronic microvascular ischemic changes.  Essentially unchanged compared to the 04/13/2020 MRI.  REVIEW OF SYSTEMS: Constitutional: No fevers, chills, sweats, or change in appetite Eyes: No visual changes, double vision, eye pain Ear, nose and throat: No hearing loss, ear pain, nasal congestion, sore throat Cardiovascular: No chest pain, palpitations Respiratory:  No shortness of breath at rest or with exertion.  No wheezes GastrointestinaI: No nausea, vomiting, diarrhea, abdominal pain, fecal incontinence Genitourinary: Has had urinary frequency and urgency Musculoskeletal:  No neck pain, back pain Integumentary: No rash, pruritus, skin lesions Neurological: as above Psychiatric: No depression at this time.  No anxiety Endocrine: She has hypothyroidism.  No palpitations, diaphoresis, change in appetite, change in weigh or increased thirst Hematologic/Lymphatic:  No anemia, purpura, petechiae. Allergic/Immunologic: No itchy/runny eyes, nasal congestion,  recent allergic reactions, rashes  ALLERGIES: Allergies  Allergen Reactions   Aleve [Naproxen Sodium] Other (See Comments)    Other reaction(s): Other Severe abdominal pain   Aspirin  Other (See Comments)    Other reaction(s): Other severe abdominal pain and excessive salavation    Atorvastatin  Other (See Comments)    Other reaction(s): Myalgias (Muscle Pain) myalgia   Clarithromycin Rash, Other (See Comments) and Diarrhea    Says allergic to mycins   Crestor   [Rosuvastatin  Calcium ]     Other reaction(s): Other   Durezol [Difluprednate] Other (See Comments)    Other reaction(s): Eye Redness Eye redness     Olopatadine Hcl     Other reaction(s): Eye Redness   Ondansetron      Other reaction(s): Other, headache   Penicillins Swelling, Rash, Other (See Comments) and Itching    Hands swelling Has patient had a PCN reaction causing immediate rash, facial/tongue/throat swelling, SOB or lightheadedness with hypotension: yes Has patient had a PCN reaction causing severe rash involving mucus membranes or skin necrosis: no Has patient had a PCN reaction that required hospitalization no Has patient had a PCN reaction occurring within the last 10 years: no If all of the above answers are NO, then may proceed with Cephalosporin use.     Pitavastatin      Other reaction(s): Arthralgia (Joint Pain)   Statins Other (See Comments)    Other reaction(s): Other (See Comments) MUSCLE CRAMPS Muscle cramps   Chlorthalidone  Other (See Comments)    Pt reports causes extreme dry mouth and throat   Crestor  [Rosuvastatin ] Other (See Comments)    Muscle aches   Other Other (See Comments)    Paseo eye drops - causes eye redness   Prednisone     headache   Adhesive [Tape] Rash and Other (See Comments)    Looks burned   Zofran  [Ondansetron  Hcl] Other (See Comments)    Headache     HOME MEDICATIONS:  Current Outpatient Medications:    Alirocumab  (PRALUENT ) 75 MG/ML SOAJ, Inject 1 mL (75  mg total) into the skin every 14 (fourteen) days., Disp: 6 mL, Rfl: 3   Cholecalciferol (VITAMIN D3) 5000 UNITS TABS, Take 1 tablet by mouth every Monday, Wednesday, and Friday. , Disp: , Rfl:    Donepezil  HCl 5 MG/DAY PTWK, one to skin q week, Disp: 4 patch, Rfl: 11   hydrocortisone  (ANUSOL -HC) 25 MG suppository, Place 1 suppository (25 mg total) rectally 2 (two) times daily., Disp: 12 suppository, Rfl: 0   hyoscyamine  (LEVSIN  SL) 0.125 MG SL tablet, Place 1 tablet (0.125 mg total) under the tongue every 4 (four) hours as needed., Disp: 30 tablet, Rfl: 6   levothyroxine  (SYNTHROID ) 25 MCG tablet, Take 25 mcg by mouth daily before breakfast., Disp: , Rfl:    Lifitegrast (XIIDRA OP), Apply to eye 2 (two) times daily. For dry eyes, Disp: , Rfl:    Magnesium  200 MG TABS, Take 400 mg by mouth daily. Per patient taking 2 tablets in the am, Disp: , Rfl:    NP THYROID  60 MG tablet, Take 60  mg by mouth daily before breakfast., Disp: , Rfl:    potassium chloride  (KLOR-CON ) 10 MEQ tablet, Take 20 mEq by mouth daily., Disp: , Rfl:    Probiotic Product (PROBIOTIC PO), Take 1-2 capsules by mouth as needed (for GI support). , Disp: , Rfl:    memantine (NAMENDA) 5 MG tablet, Take 5 mg by mouth 2 (two) times daily. (Patient not taking: Reported on 03/07/2024), Disp: , Rfl:  No current facility-administered medications for this visit.  Facility-Administered Medications Ordered in Other Visits:    chlorhexidine  (HIBICLENS ) 4 % liquid 4 application, 60 mL, Topical, Once, Stanbery, Mary L, PA-C   chlorhexidine  (HIBICLENS ) 4 % liquid 4 application, 60 mL, Topical, Once, Jule Ronal CROME, PA-C  PAST MEDICAL HISTORY: Past Medical History:  Diagnosis Date   Anxiety    Arthritis    left; maybe back (01/13/2016)   Cancer (HCC)    skin pre-cancer   Closed fracture of left distal radius    Depression    Diverticulitis    Diverticulosis    Family history of adverse reaction to anesthesia    daughter gets really  nauseous & has trouble getting intubated (01/13/2016)   GERD (gastroesophageal reflux disease)    HISTORY   History of blood transfusion 1977   related to OR   Hx of adenomatous colonic polyps    Hyperlipidemia    Hypertension    Hypothyroidism    Lichen sclerosus 01/2014   Biopsy proven   Menopause    Mild aortic insufficiency    Osteopenia    Seasonal allergies     PAST SURGICAL HISTORY: Past Surgical History:  Procedure Laterality Date   ACHILLES TENDON SURGERY Right 04/25/2016   Procedure: ACHILLES TENDON REPAIR,PARTIAL EXCISION CALCANEOUS;  Surgeon: Toribio JULIANNA Chancy, MD;  Location: [facility];  Service: Orthopedics;  Laterality: Right;   APPENDECTOMY  1977   BACK SURGERY     BLEPHAROPLASTY Bilateral    CARDIAC CATHETERIZATION  ~ 2014   CARPAL TUNNEL RELEASE Right    CATARACT EXTRACTION W/ INTRAOCULAR LENS  IMPLANT, BILATERAL Bilateral    CERVICAL FUSION  1999   C5-7   DILATATION & CURETTAGE/HYSTEROSCOPY WITH TRUECLEAR N/A 05/02/2013   Procedure: DILATATION & CURETTAGE/HYSTEROSCOPY WITH TRUECLEAR ;  Surgeon: Evalene SHAUNNA Organ, MD;  Location: [facility];  Service: Gynecology;  Laterality: N/A;   DILATION AND CURETTAGE OF UTERUS     HARDWARE REMOVAL Left 12/12/2013   Procedure:  LEFT PATELLA HARDWARE REMOVAL;  Surgeon: Toribio JULIANNA Chancy, MD;  Location: [facility];  Service: Orthopedics;  Laterality: Left;   HYSTEROSCOPY     JOINT REPLACEMENT     KNEE ARTHROSCOPY Left 12/12/2013   Procedure: LEFT PATELLA ARTHROSCOPY KNEE WITH DEBRIDEMENT/SHAVING (CONDROPLASTY), LYSIS OF ADHESIONS;  Surgeon: Toribio JULIANNA Chancy, MD;  Location: [facility];  Service: Orthopedics;  Laterality: Left;   LEFT HEART CATHETERIZATION WITH CORONARY ANGIOGRAM N/A 04/22/2014   Procedure: LEFT HEART CATHETERIZATION WITH CORONARY ANGIOGRAM;  Surgeon: Victory LELON Claudene DOUGLAS, MD;  Location: [facility] C[facility];  Service: Cardiovascular;  Laterality: N/A;   LUMBAR DISC SURGERY  2004   L5    OPEN REDUCTION INTERNAL FIXATION (ORIF) DISTAL RADIAL FRACTURE Left 05/07/2020   Procedure: OPEN REDUCTION INTERNAL FIXATION (ORIF) DISTAL RADIAL FRACTURE;  Surgeon: Cristy Bonner DASEN, MD;  Location: [facility];  Service: Orthopedics;  Laterality: Left;  block in preop   PARTIAL KNEE ARTHROPLASTY Left 01/13/2016   Procedure: LEFT UNICOMPARTMENTAL KNEE;  Surgeon: Toribio  JULIANNA Chancy, MD;  Location: [facility];  Service: Orthopedics;  Laterality: Left;   PATELLA RECONSTRUCTION Left 1994   REPLACEMENT UNICONDYLAR JOINT KNEE Left 01/13/2016   RIGHT OOPHORECTOMY  1978   TONSILLECTOMY      FAMILY HISTORY: Family History  Problem Relation Age of Onset   Allergies Mother    Heart disease Father        MI at age 74. CABG x4 and AAA repair at age 33.   Colon cancer Neg Hx    Rectal cancer Neg Hx    Esophageal cancer Neg Hx    Stomach cancer Neg Hx    Pancreatic cancer Neg Hx     SOCIAL HISTORY: Social History   Socioeconomic History   Marital status: Divorced    Spouse name: Not on file   Number of children: 2   Years of education: Not on file   Highest education level: Not on file  Occupational History   Occupation: RESEARCH ASSIST    Employer: [employer]    Comment: Retired   Tobacco Use   Smoking status: Former    Current packs/day: 0.00    Average packs/day: 1 pack/day for 27.0 years (27.0 ttl pk-yrs)    Types: Cigarettes    Start date: 12/09/1956    Quit date: 12/10/1983    Years since quitting: 40.2   Smokeless tobacco: Never  Vaping Use   Vaping status: Never Used  Substance and Sexual Activity   Alcohol use: Not Currently    Comment: 01/13/2016 I'll have a drink < 2 times/month   Drug use: No   Sexual activity: Never    Birth control/protection: Post-menopausal  Other Topics Concern   Not on file  Social History Narrative   Daily caffeine    Social Drivers of Health   Financial Resource Strain: Not on file  Food Insecurity: Low Risk  (12/19/2022)    Received from [facility]   Hunger Vital Sign    Within the past 12 months, you worried that your food would run out before you got money to buy more: Never true    Within the past 12 months, the food you bought just didn't last and you didn't have money to get more. : Never true  Transportation Needs: No Transportation Needs (12/19/2022)   Received from [facility]    In the past 12 months, has lack of reliable transportation kept you from medical appointments, meetings, work or from getting things needed for daily living? : No  Physical Activity: Not on file  Stress: Not on file  Social Connections: Unknown (01/07/2022)   Received from [facility]   Social Network    Social Network: Not on file  Intimate Partner Violence: Unknown (11/29/2021)   Received from [facility]   HITS    Physically Hurt: Not on file    Insult or Talk Down To: Not on file    Threaten Physical Harm: Not on file    Scream or Curse: Not on file       PHYSICAL EXAM  Vitals:   03/07/24 1550  BP: (!) 157/88  Pulse: 88  Weight: 163 lb 8 oz (74.2 kg)  Height: 5' (1.524 m)    Body mass index is 31.93 kg/m.   General: The patient is well-developed and well-nourished and in no acute distress  HEENT:  Head is [place]/AT.  Sclera are anicteric.  Funduscopic exam shows normal optic discs and retinal vessels.  Neck: No  carotid bruits are noted.  The neck is nontender.  Cardiovascular: The heart has a regular rate and rhythm with a normal S1 and S2. There were no murmurs, gallops or rubs.    Skin: Extremities are without rash or  edema.  Musculoskeletal:  Back is nontender  Neurologic Exam  Mental status: The patient is alert and oriented x 3 at the time of the examination.  She scored 26/30 on the Mini-Mental status exam this is on the lower limit of normal.   Speech is normal.  Cranial nerves: Extraocular movements are full. No ptosis.  Pupils are equal, round, and reactive to light  and accomodation.  Visual fields are full.  Facial symmetry is present. There is good facial sensation to soft touch bilaterally.Facial strength is normal.  Trapezius and sternocleidomastoid strength is normal. No dysarthria is noted.  The tongue is midline, and the patient has symmetric elevation of the soft palate. Wears hearing aids.   Reduced hearing worse on left.   Weber lateralized to the left.    Motor:  Muscle bulk is normal.   Tone is normal. Strength is  5 / 5 in all 4 extremities.   Sensory: Sensory testing is intact to pinprick, soft touch and vibration sensation in all 4 extremities.  Coordination: Cerebellar testing reveals good finger-nose-finger and heel-to-shin bilaterally.  Gait and station: Station is normal.   The gait is minimally wide but she can turn 180 degrees in 3-4 steps which is likely normal for age.  Tandem gait was mildly wide.  Romberg is negative.   Reflexes: Deep tendon reflexes are symmetric and normal bilaterally.      DIAGNOSTIC DATA (LABS, IMAGING, TESTING) - I reviewed patient records, labs, notes, testing and imaging myself where available.  Lab Results  Component Value Date   WBC 7.9 03/04/2024   HGB 15.8 (H) 03/04/2024   HCT 46.2 (H) 03/04/2024   MCV 93.1 03/04/2024   PLT 314.0 03/04/2024      Component Value Date/Time   NA 141 03/04/2024 1534   NA 140 04/21/2021 1552   K 3.9 03/04/2024 1534   CL 104 03/04/2024 1534   CO2 28 03/04/2024 1534   GLUCOSE 97 03/04/2024 1534   BUN 7 03/04/2024 1534   BUN 22 04/21/2021 1552   CREATININE 0.63 03/04/2024 1534   CREATININE 0.64 10/07/2014 1202   CALCIUM  9.9 03/04/2024 1534   PROT 7.1 03/04/2024 1534   PROT 6.0 04/21/2021 1552   ALBUMIN 4.5 03/04/2024 1534   ALBUMIN 4.3 04/21/2021 1552   AST 24 03/04/2024 1534   ALT 27 03/04/2024 1534   ALKPHOS 72 03/04/2024 1534   BILITOT 0.7 03/04/2024 1534   BILITOT <0.2 04/21/2021 1552   GFRNONAA 85 06/29/2020 1211   GFRAA 98 06/29/2020 1211   Lab  Results  Component Value Date   CHOL 145 08/21/2019   HDL 54 08/21/2019   LDLCALC 62 08/21/2019   LDLDIRECT 206.1 08/01/2007   TRIG 174 (H) 08/21/2019   CHOLHDL 2.7 08/21/2019   Lab Results  Component Value Date   HGBA1C 5.8 (H) 11/05/2012   No results found for: VITAMINB12 Lab Results  Component Value Date   TSH 0.09 (L) 03/04/2024       ASSESSMENT AND PLAN  Ataxic gait - Plan: MR CERVICAL SPINE WO CONTRAST, Vitamin B12, Copper, serum  Memory loss  Dizzy   In summary, Ms. Ackman is an 83 year old woman who has been noted to have mild memory issues and has noted imbalance  and dizziness.  The etiology is not clear.  The recent CT scan showed no acute findings and the scan appears fairly normal for age.  She scored 26/30 on the Mini-Mental status exam which is low normal.  However, family has noted a significant change over the past year.  She was unable to tolerate donepezil  tablets or memantine.  I will have her try the donepezil  patch may be better tolerated.  She did think that the donepezil  helped her when she was on it.  Due to the mild imbalance and recent urinary urgency we will also check an MRI of the cervical spine to determine if there is any spinal cord process that could be contributing to her symptoms.  I will also check B12 and copper.  They will return to see me in 6 months but we will let them know the results of the studies and appropriate treatment or referral may be necessary.  Thank you for asking me to see this patient.  Please let me know if I can be of further assistance with her other patients in the future.   This visit is part of a comprehensive longitudinal care medical relationship regarding the patients primary diagnosis of imbalance and related concerns.   Markeisha Mancias A. Vear, MD, [facility] 03/07/2024, 5:46 PM Certified in Neurology, Clinical Neurophysiology, Sleep Medicine and Neuroimaging  [facility] [address], [address] [place], [place] [address] [phone]

## 2024-03-09 ENCOUNTER — Ambulatory Visit: Payer: Self-pay | Admitting: Neurology

## 2024-03-09 LAB — VITAMIN B12: Vitamin B-12: >2000 pg/mL — ABNORMAL HIGH (ref 232–1245)

## 2024-03-09 LAB — COPPER, SERUM: Copper: 102 ug/dL (ref 80–158)

## 2024-03-11 ENCOUNTER — Telehealth: Payer: Self-pay | Admitting: Neurology

## 2024-03-11 ENCOUNTER — Telehealth: Payer: Self-pay | Admitting: *Deleted

## 2024-03-11 NOTE — Telephone Encounter (Signed)
 no auth required sent to GI [phone]

## 2024-03-11 NOTE — Telephone Encounter (Signed)
 Per note on 03/07/24  She was unable to tolerate donepezil  tablets or memantine. I will have her try the donepezil  patch may be better tolerated. She did think that the donepezil  helped her when she was on it.   May be PA would help since pt has tried the pill form.

## 2024-03-11 NOTE — Telephone Encounter (Signed)
 Called and spoke with Darice in the [facility] lab. The UA test that was ordered does not include a culture, the order has to states culture. That would have been a different test, they only keep urine samples 1 day so no way to add on.

## 2024-03-11 NOTE — Telephone Encounter (Signed)
 Pt's Insurance won't cover Donepezil , Please send in Alternative Treatment

## 2024-03-11 NOTE — Telephone Encounter (Signed)
 Message sent to PA team to see if PA would help.   Per note on 03/08/23 pt was not able to take the pill form.

## 2024-03-11 NOTE — Telephone Encounter (Signed)
 Inbound call from patient requesting a call regarding note below. States she has not seen anything regarding urine analysis. Please advise, thank you.

## 2024-03-12 ENCOUNTER — Telehealth: Payer: Self-pay

## 2024-03-12 ENCOUNTER — Other Ambulatory Visit (HOSPITAL_COMMUNITY): Payer: Self-pay

## 2024-03-12 ENCOUNTER — Ambulatory Visit (HOSPITAL_BASED_OUTPATIENT_CLINIC_OR_DEPARTMENT_OTHER): Admitting: Cardiology

## 2024-03-12 ENCOUNTER — Encounter (HOSPITAL_BASED_OUTPATIENT_CLINIC_OR_DEPARTMENT_OTHER): Payer: Self-pay | Admitting: Cardiology

## 2024-03-12 VITALS — BP 162/87 | HR 71 | Ht 61.0 in | Wt 163.0 lb

## 2024-03-12 DIAGNOSIS — R61 Generalized hyperhidrosis: Secondary | ICD-10-CM

## 2024-03-12 DIAGNOSIS — R7989 Other specified abnormal findings of blood chemistry: Secondary | ICD-10-CM

## 2024-03-12 DIAGNOSIS — R001 Bradycardia, unspecified: Secondary | ICD-10-CM

## 2024-03-12 DIAGNOSIS — R0609 Other forms of dyspnea: Secondary | ICD-10-CM

## 2024-03-12 DIAGNOSIS — I10 Essential (primary) hypertension: Secondary | ICD-10-CM | POA: Diagnosis not present

## 2024-03-12 DIAGNOSIS — E782 Mixed hyperlipidemia: Secondary | ICD-10-CM

## 2024-03-12 NOTE — Patient Instructions (Signed)
 Medication Instructions:   Your physician recommends that you continue on your current medications as directed. Please refer to the Current Medication list given to you today.   *If you need a refill on your cardiac medications before your next appointment, please call your pharmacy*  Lab Work:  None ordered.  If you have labs (blood work) drawn today and your tests are completely normal, you will receive your results only by: MyChart Message (if you have MyChart) OR A paper copy in the mail If you have any lab test that is abnormal or we need to change your treatment, we will call you to review the results.  Testing/Procedures:  None ordered.  Follow-Up: At [facility], you and your health needs are our priority.  As part of our continuing mission to provide you with exceptional heart care, our providers are all part of one team.  This team includes your primary Cardiologist (physician) and Advanced Practice Providers or APPs (Physician Assistants and Nurse Practitioners) who all work together to provide you with the care you need, when you need it.  Your next appointment:   6 month(s)  Provider:   Sheryle Donning, MD    We recommend signing up for the patient portal called "MyChart".  Sign up information is provided on this After Visit Summary.  MyChart is used to connect with patients for Virtual Visits (Telemedicine).  Patients are able to view lab/test results, encounter notes, upcoming appointments, etc.  Non-urgent messages can be sent to your provider as well.   To learn more about what you can do with MyChart, go to [email or web address].   Other Instructions  Your physician wants you to follow-up in: 6 months.  You will receive a reminder letter in the mail two months in advance. If you don't receive a letter, please call our office to schedule the follow-up appointment.

## 2024-03-12 NOTE — Telephone Encounter (Signed)
 Pt called to discuss the donepezil   and Adlarity , in the middle of the call she said she would need to call back.

## 2024-03-12 NOTE — Telephone Encounter (Signed)
 Pharmacy Patient Advocate Encounter   Received notification from Physician's Office that prior authorization for Adlarity  5MG /DAY weekly patches is required/requested.   Insurance verification completed.   The patient is insured through [facility] .   Per test claim: PA required; PA submitted to above mentioned insurance via CoverMyMeds Key/confirmation #/EOC [id number] Status is pending

## 2024-03-12 NOTE — Progress Notes (Signed)
  Cardiology Office Note:  .   Date:  03/12/2024  ID:  Marilyn Rivas, DOB 1941-02-16, MRN [id number] PCP: Theo Iha, MD  [facility] Providers Cardiologist:  Shelda Bruckner, MD {  History of Present Illness: .   Marilyn Rivas is a 83 y.o. female with PMH mild AR, anxiety, hypothyroidism, hyperlipidemia with statin intolerance. She was previously followed by Dr. Maranda and Dr. Hobart. She established care with me on 08/11/23.   Today: Was on generic cymbalta, was having profuse sweating. Changed to pristiq, after about a month started having lightheadedness/dizziness, stomach pain. Now off pristiq, on klonopin while they are trying to figure out the etiology. Has seen neuro and GI for her symptoms. No syncope. Sweating has resolved. Lightheadedness has been unchanged. No falls.   She has been very stressed with all of these symptoms. Has a bad backache today, which she attributes her high blood pressure to. Reports that blood pressure readings at home have been well controlled, no log available today.  No chest pain any more. Dyspnea on exertion unchanged.  ROS: Denies chest pain, shortness of breath at rest. No PND, orthopnea, LE edema or unexpected weight gain. No syncope or palpitations. ROS otherwise negative except as noted.   Studies Reviewed: SABRA    EKG:       Physical Exam:   VS:  BP (!) 162/87 (BP Location: Left Arm, Patient Position: Sitting, Cuff Size: Normal)   Pulse 71   Ht 5' 1 (1.549 m)   Wt 163 lb (73.9 kg)   SpO2 97%   BMI 30.80 kg/m    Wt Readings from Last 3 Encounters:  03/12/24 163 lb (73.9 kg)  03/07/24 163 lb 8 oz (74.2 kg)  03/04/24 162 lb (73.5 kg)    GEN: Well nourished, well developed in no acute distress HEENT: Normal, moist mucous membranes NECK: No JVD CARDIAC: regular rhythm, normal S1 and S2, no rubs or gallops. No murmur. VASCULAR: Radial and DP pulses 2+ bilaterally. No carotid bruits RESPIRATORY:  Clear  to auscultation without rales, wheezing or rhonchi  ABDOMEN: Soft, non-tender, non-distended MUSCULOSKELETAL:  Ambulates independently SKIN: Warm and dry, no edema NEUROLOGIC:  Alert and oriented x 3. No focal neuro deficits noted. PSYCHIATRIC:  Normal affect    ASSESSMENT AND PLAN: .    Dyspnea on exertion -stable/chronic -completely normal cath, recently unremarkable monitor and prior echo -reviewed red flag warning signs that need immediate medical attention  HTN: -controlled at home, above goal today due to pain -Continue lifestyle modifications and low Na diet   HLD: Intolerant of statins. On PCSK9i. -Continue praulent -LDL controlled 62   Hypothyroidism Excessive sweating -TSH recently low, recent med changes, being followed by her PCP   CV risk counseling and prevention -recommend heart healthy/Mediterranean diet, with whole grains, fruits, vegetable, fish, lean meats, nuts, and olive oil. Limit salt. -recommend moderate walking, 3-5 times/week for 30-50 minutes each session. Aim for at least 150 minutes.week. Goal should be pace of 3 miles/hours, or walking 1.5 miles in 30 minutes -recommend avoidance of tobacco products. Avoid excess alcohol.  Dispo: 6 mos or sooner as needed  Signed, Shelda Bruckner, MD   Shelda Bruckner, MD, PhD, [facility] [place]  [facility]  [place]  [facility] at [facility] at [facility] [address] [place], [place] [address] [phone]

## 2024-03-13 ENCOUNTER — Inpatient Hospital Stay
Admission: RE | Admit: 2024-03-13 | Discharge: 2024-03-13 | Discharge status: Home / Self Care | Source: Ambulatory Visit | Attending: Neurology | Admitting: Neurology

## 2024-03-13 DIAGNOSIS — R260 Ataxic gait: Secondary | ICD-10-CM

## 2024-03-13 MED ORDER — GALANTAMINE HYDROBROMIDE 4 MG PO TABS: 4.0000 mg | ORAL_TABLET | Freq: Two times a day (BID) | ORAL | 6 refills | Status: AC

## 2024-03-13 NOTE — Telephone Encounter (Signed)
 Pharmacy Patient Advocate Encounter  Received notification from [employer] that Prior Authorization for Adlarity  5MG /DAY weekly patches has been DENIED.  Full denial letter will be uploaded to the media tab. See denial reason below.   PA #/Case ID/Reference #: PA Case ID #: [id number]

## 2024-03-13 NOTE — Telephone Encounter (Signed)
 Per Dr. Vear prior to denial      Pt said she would like to try the galantamine  4 mg BID. Rx sent.

## 2024-03-19 ENCOUNTER — Ambulatory Visit: Admitting: Physician Assistant

## 2024-03-25 ENCOUNTER — Encounter (HOSPITAL_BASED_OUTPATIENT_CLINIC_OR_DEPARTMENT_OTHER): Payer: Self-pay | Admitting: Cardiology

## 2024-04-26 ENCOUNTER — Telehealth: Payer: Self-pay | Admitting: Physician Assistant

## 2024-04-26 NOTE — Telephone Encounter (Addendum)
 Error

## 2024-05-06 ENCOUNTER — Ambulatory Visit: Admitting: Internal Medicine

## 2024-05-08 ENCOUNTER — Other Ambulatory Visit: Payer: Self-pay | Admitting: Internal Medicine

## 2024-05-08 DIAGNOSIS — R42 Dizziness and giddiness: Secondary | ICD-10-CM

## 2024-05-22 ENCOUNTER — Ambulatory Visit
Admission: RE | Admit: 2024-05-22 | Discharge: 2024-05-22 | Disposition: A | Discharge status: Home / Self Care | Source: Ambulatory Visit | Attending: Internal Medicine | Admitting: Internal Medicine

## 2024-05-22 DIAGNOSIS — R42 Dizziness and giddiness: Secondary | ICD-10-CM

## 2024-07-15 ENCOUNTER — Ambulatory Visit: Admitting: Internal Medicine

## 2024-07-15 ENCOUNTER — Encounter: Payer: Self-pay | Admitting: Internal Medicine

## 2024-07-15 VITALS — BP 136/80 | HR 79 | Ht 61.0 in

## 2024-07-15 DIAGNOSIS — R194 Change in bowel habit: Secondary | ICD-10-CM

## 2024-07-15 DIAGNOSIS — R143 Flatulence: Secondary | ICD-10-CM | POA: Diagnosis not present

## 2024-07-15 DIAGNOSIS — R159 Full incontinence of feces: Secondary | ICD-10-CM | POA: Diagnosis not present

## 2024-07-15 NOTE — Progress Notes (Signed)
 HISTORY OF PRESENT ILLNESS:  Marilyn Rivas is a 83 y.o. female with past medical history as listed below.  She presents today with complaints of rare explosive diarrhea associated with fecal incontinence.  I last saw the patient in the office August 2023.  She was last seen by the GI physician assistant March 04, 2024.  See those dictations.  Patient tells me that she was taken off an antidepressant and experienced a number of symptoms.  She was subsequently changed to a new antidepressant tells me that she is feeling better.  Typically she has 2 formed bowel movements in the morning.  However, on rare occasion she will experience explosive diarrhea associated with incontinence.  This typically happens after lunch.  She has had 3 such episodes in recent months.  No bleeding.  Last colonoscopy 2018.  She tells me that her thyroid  medication was increased.  She takes 1 magnesium  at night.  She denies sugar substitutes.  She has been on Praluent  for a few years.  She does not wear protective undergarments.  She does take Citrucel daily-2 teaspoons.  Finally, she complains of excessive flatus  REVIEW OF SYSTEMS:  All non-GI ROS negative except for back pain, fatigue, hearing problems, urinary leakage  Past Medical History:  Diagnosis Date   Anxiety    Arthritis    left; maybe back (01/13/2016)   Cancer (HCC)    skin pre-cancer   Closed fracture of left distal radius    Depression    Diverticulitis    Diverticulosis    Family history of adverse reaction to anesthesia    daughter gets really nauseous & has trouble getting intubated (01/13/2016)   GERD (gastroesophageal reflux disease)    HISTORY   History of blood transfusion 1977   related to OR   Hx of adenomatous colonic polyps    Hyperlipidemia    Hypertension    Hypothyroidism    Lichen sclerosus 01/2014   Biopsy proven   Menopause    Mild aortic insufficiency    Osteopenia    Seasonal allergies     Past Surgical  History:  Procedure Laterality Date   ACHILLES TENDON SURGERY Right 04/25/2016   Procedure: ACHILLES TENDON REPAIR,PARTIAL EXCISION CALCANEOUS;  Surgeon: Toribio JULIANNA Chancy, MD;  Location: [facility];  Service: Orthopedics;  Laterality: Right;   APPENDECTOMY  1977   BACK SURGERY     BLEPHAROPLASTY Bilateral    CARDIAC CATHETERIZATION  ~ 2014   CARPAL TUNNEL RELEASE Right    CATARACT EXTRACTION W/ INTRAOCULAR LENS  IMPLANT, BILATERAL Bilateral    CERVICAL FUSION  1999   C5-7   DILATATION & CURETTAGE/HYSTEROSCOPY WITH TRUECLEAR N/A 05/02/2013   Procedure: DILATATION & CURETTAGE/HYSTEROSCOPY WITH TRUECLEAR ;  Surgeon: Evalene SHAUNNA Organ, MD;  Location: [facility];  Service: Gynecology;  Laterality: N/A;   DILATION AND CURETTAGE OF UTERUS     HARDWARE REMOVAL Left 12/12/2013   Procedure:  LEFT PATELLA HARDWARE REMOVAL;  Surgeon: Toribio JULIANNA Chancy, MD;  Location:  SURGERY CENTER;  Service: Orthopedics;  Laterality: Left;   HYSTEROSCOPY     JOINT REPLACEMENT     KNEE ARTHROSCOPY Left 12/12/2013   Procedure: LEFT PATELLA ARTHROSCOPY KNEE WITH DEBRIDEMENT/SHAVING (CONDROPLASTY), LYSIS OF ADHESIONS;  Surgeon: Toribio JULIANNA Chancy, MD;  Location:  SURGERY CENTER;  Service: Orthopedics;  Laterality: Left;   LEFT HEART CATHETERIZATION WITH CORONARY ANGIOGRAM N/A 04/22/2014   Procedure: LEFT HEART CATHETERIZATION WITH CORONARY ANGIOGRAM;  Surgeon: Victory LELON Claudene DOUGLAS, MD;  Location: [facility];  Service: Cardiovascular;  Laterality: N/A;   LUMBAR DISC SURGERY  2004   L5   OPEN REDUCTION INTERNAL FIXATION (ORIF) DISTAL RADIAL FRACTURE Left 05/07/2020   Procedure: OPEN REDUCTION INTERNAL FIXATION (ORIF) DISTAL RADIAL FRACTURE;  Surgeon: Cristy Bonner DASEN, MD;  Location: [facility];  Service: Orthopedics;  Laterality: Left;  block in preop   PARTIAL KNEE ARTHROPLASTY Left 01/13/2016   Procedure: LEFT UNICOMPARTMENTAL KNEE;  Surgeon: Toribio JULIANNA Chancy, MD;  Location: [facility];  Service:  Orthopedics;  Laterality: Left;   PATELLA RECONSTRUCTION Left 1994   REPLACEMENT UNICONDYLAR JOINT KNEE Left 01/13/2016   RIGHT OOPHORECTOMY  1978   TONSILLECTOMY      Social History Marilyn Rivas  reports that she quit smoking about 40 years ago. Her smoking use included cigarettes. She started smoking about 67 years ago. She has a 27 pack-year smoking history. She has never used smokeless tobacco. She reports that she does not currently use alcohol. She reports that she does not use drugs.  family history includes Allergies in her mother; Heart disease in her father.  Allergies  Allergen Reactions   Aleve [Naproxen Sodium] Other (See Comments)    Other reaction(s): Other Severe abdominal pain   Aspirin  Other (See Comments)    Other reaction(s): Other severe abdominal pain and excessive salavation    Atorvastatin  Other (See Comments)    Other reaction(s): Myalgias (Muscle Pain) myalgia   Clarithromycin Rash, Other (See Comments) and Diarrhea    Says allergic to mycins   Crestor   [Rosuvastatin  Calcium ]     Other reaction(s): Other   Durezol [Difluprednate] Other (See Comments)    Other reaction(s): Eye Redness Eye redness     Olopatadine Hcl     Other reaction(s): Eye Redness   Ondansetron      Other reaction(s): Other, headache   Penicillins Swelling, Rash, Other (See Comments) and Itching    Hands swelling Has patient had a PCN reaction causing immediate rash, facial/tongue/throat swelling, SOB or lightheadedness with hypotension: yes Has patient had a PCN reaction causing severe rash involving mucus membranes or skin necrosis: no Has patient had a PCN reaction that required hospitalization no Has patient had a PCN reaction occurring within the last 10 years: no If all of the above answers are NO, then may proceed with Cephalosporin use.     Pitavastatin      Other reaction(s): Arthralgia (Joint Pain)   Statins Other (See Comments)    Other reaction(s):  Other (See Comments) MUSCLE CRAMPS Muscle cramps   Chlorthalidone  Other (See Comments)    Pt reports causes extreme dry mouth and throat   Crestor  [Rosuvastatin ] Other (See Comments)    Muscle aches   Other Other (See Comments)    Paseo eye drops - causes eye redness   Prednisone     headache   Adhesive [Tape] Rash and Other (See Comments)    Looks burned   Zofran  [Ondansetron  Hcl] Other (See Comments)    Headache        PHYSICAL EXAMINATION: Vital signs: BP 136/80   Pulse 79   Ht 5' 1 (1.549 m)   BMI 30.80 kg/m   Constitutional: generally well-appearing, no acute distress Psychiatric: alert and oriented x3, cooperative Eyes: extraocular movements intact, anicteric, conjunctiva pink Mouth: oral pharynx moist, no lesions Neck: supple no lymphadenopathy Cardiovascular: heart regular rate and rhythm, no murmur Lungs: clear to auscultation bilaterally Abdomen: soft, nontender, nondistended, no obvious ascites, no peritoneal signs, normal  bowel sounds, no organomegaly Rectal: Omitted Extremities: no clubbing, cyanosis, or lower extremity edema bilaterally Skin: no lesions on visible extremities Neuro: No focal deficits.  Cranial nerves intact  ASSESSMENT:  1.  Occasional diarrhea with urgency and incontinence. 2.  History of diverticulitis 3.  History of adenomatous colon polyps.  Aged out of surveillance 4.  Unremarkable EGD 2014   PLAN:  1.  Discussed possible medications that may result in diarrhea 2.  Asked her to make sure with her prescribing physician that her TSH is in the normal range.  Excess thyroid  hormone can cause loose stools. 3.  Increase Citrucel to 2 tablespoons daily 4.  Wear protective undergarments 5.  Resume general medical care PCP.  GI follow-up as needed A total time of 30 minutes was spent preparing to see the patient, obtaining comprehensive history, performing medically appropriate physical examination, counseling and educating the patient  regarding the above listed issues, directing symptomatic therapies, and documenting clinical information in the health record

## 2024-07-15 NOTE — Patient Instructions (Signed)
 Increase your Citrucel to 2 tablespoons daily.  Wear protective undergarments.  Please follow up as needed.  _______________________________________________________  If your blood pressure at your visit was 140/90 or greater, please contact your primary care physician to follow up on this.  _______________________________________________________  If you are age 84 or older, your body mass index should be between 23-30. Your Body mass index is 30.8 kg/m. If this is out of the aforementioned range listed, please consider follow up with your Primary Care Provider.  If you are age 55 or younger, your body mass index should be between 19-25. Your Body mass index is 30.8 kg/m. If this is out of the aformentioned range listed, please consider follow up with your Primary Care Provider.   ________________________________________________________  The [facility] providers would like to encourage you to use MYCHART to communicate with providers for non-urgent requests or questions.  Due to long hold times on the telephone, sending your provider a message by [facility] may be a faster and more efficient way to get a response.  Please allow 48 business hours for a response.  Please remember that this is for non-urgent requests.  _______________________________________________________  [facility] is using a team-based approach to care.  Your team is made up of your doctor and two to three APPS. Our APPS (Nurse Practitioners and Physician Assistants) work with your physician to ensure care continuity for you. They are fully qualified to address your health concerns and develop a treatment plan. They communicate directly with your gastroenterologist to care for you. Seeing the Advanced Practice Practitioners on your physician's team can help you by facilitating care more promptly, often allowing for earlier appointments, access to diagnostic testing, procedures, and other specialty referrals.

## 2024-08-07 ENCOUNTER — Encounter (HOSPITAL_BASED_OUTPATIENT_CLINIC_OR_DEPARTMENT_OTHER): Payer: Self-pay | Admitting: Cardiology

## 2024-09-04 ENCOUNTER — Telehealth: Payer: Self-pay | Admitting: Cardiology

## 2024-09-04 NOTE — Telephone Encounter (Signed)
 Spoke with patient and advised will forward to Pharm D and PA team

## 2024-09-04 NOTE — Telephone Encounter (Signed)
 Pt c/o medication issue:  1. Name of Medication:   Alirocumab  (PRALUENT ) 75 MG/ML SOAJ    2. How are you currently taking this medication (dosage and times per day)?    3. Are you having a reaction (difficulty breathing--STAT)? no  4. What is your medication issue? Calling about getting a grant for this medication. Please advise

## 2024-09-05 ENCOUNTER — Telehealth: Payer: Self-pay | Admitting: Pharmacy Technician

## 2024-09-05 NOTE — Telephone Encounter (Signed)
" ° °  Patient Advocate Encounter   The patient was approved for a [employer] grant that will help cover the cost of PRALUENT  Total amount awarded, 2500.  Effective: 09/22/24 - 09/21/25   APW:[id number] ERW:[id number] Hmnle:[id number] PI:[id number] [employer] ID: [id number]   Pharmacy provided with approval and processing information. Patient informed via mychart  "

## 2024-10-02 ENCOUNTER — Other Ambulatory Visit: Payer: Self-pay | Admitting: Cardiology

## 2024-10-14 ENCOUNTER — Ambulatory Visit: Admitting: Neurology
